# Patient Record
Sex: Female | Born: 1940 | Race: White | Hispanic: No | Smoking: Former smoker
Health system: Southern US, Community
[De-identification: ages and names within clinical notes are randomized; demographics above are authoritative.]

## PROBLEM LIST (undated history)

## (undated) DIAGNOSIS — I771 Stricture of artery: Secondary | ICD-10-CM

## (undated) DIAGNOSIS — K439 Ventral hernia without obstruction or gangrene: Secondary | ICD-10-CM

## (undated) DIAGNOSIS — I10 Essential (primary) hypertension: Secondary | ICD-10-CM

## (undated) DIAGNOSIS — E079 Disorder of thyroid, unspecified: Secondary | ICD-10-CM

## (undated) DIAGNOSIS — N189 Chronic kidney disease, unspecified: Secondary | ICD-10-CM

## (undated) DIAGNOSIS — E785 Hyperlipidemia, unspecified: Secondary | ICD-10-CM

## (undated) HISTORY — DX: Stricture of artery: I77.1

## (undated) HISTORY — PX: CHOLECYSTECTOMY: SHX55

## (undated) HISTORY — DX: Disorder of thyroid, unspecified: E07.9

## (undated) HISTORY — DX: Essential (primary) hypertension: I10

## (undated) HISTORY — DX: Ventral hernia without obstruction or gangrene: K43.9

## (undated) HISTORY — DX: Hyperlipidemia, unspecified: E78.5

## (undated) HISTORY — DX: Chronic kidney disease, unspecified: N18.9

---

## 2008-12-03 HISTORY — PX: OTHER SURGICAL HISTORY: SHX169

## 2008-12-13 ENCOUNTER — Encounter: Admission: RE | Admit: 2008-12-13 | Discharge: 2008-12-13 | Payer: Self-pay | Admitting: Family Medicine

## 2008-12-31 ENCOUNTER — Encounter: Admission: RE | Admit: 2008-12-31 | Discharge: 2008-12-31 | Payer: Self-pay | Admitting: Family Medicine

## 2009-01-06 ENCOUNTER — Ambulatory Visit: Payer: Self-pay | Admitting: *Deleted

## 2009-01-13 ENCOUNTER — Encounter: Admission: RE | Admit: 2009-01-13 | Discharge: 2009-01-13 | Payer: Self-pay | Admitting: *Deleted

## 2009-01-13 ENCOUNTER — Ambulatory Visit: Payer: Self-pay | Admitting: *Deleted

## 2009-01-26 ENCOUNTER — Ambulatory Visit: Payer: Self-pay | Admitting: *Deleted

## 2009-01-26 ENCOUNTER — Ambulatory Visit (HOSPITAL_COMMUNITY): Admission: RE | Admit: 2009-01-26 | Discharge: 2009-01-26 | Payer: Self-pay | Admitting: *Deleted

## 2009-03-14 ENCOUNTER — Encounter (INDEPENDENT_AMBULATORY_CARE_PROVIDER_SITE_OTHER): Payer: Self-pay | Admitting: *Deleted

## 2009-03-14 ENCOUNTER — Ambulatory Visit: Payer: Self-pay | Admitting: *Deleted

## 2009-03-14 ENCOUNTER — Inpatient Hospital Stay (HOSPITAL_COMMUNITY): Admission: AD | Admit: 2009-03-14 | Discharge: 2009-03-19 | Payer: Self-pay | Admitting: *Deleted

## 2009-03-28 ENCOUNTER — Ambulatory Visit: Payer: Self-pay | Admitting: *Deleted

## 2009-04-07 ENCOUNTER — Ambulatory Visit: Payer: Self-pay | Admitting: *Deleted

## 2009-05-05 ENCOUNTER — Ambulatory Visit: Payer: Self-pay | Admitting: *Deleted

## 2011-03-14 LAB — BASIC METABOLIC PANEL
BUN: 10 mg/dL (ref 6–23)
BUN: 11 mg/dL (ref 6–23)
CO2: 21 mEq/L (ref 19–32)
CO2: 22 mEq/L (ref 19–32)
CO2: 25 mEq/L (ref 19–32)
Calcium: 6.9 mg/dL — ABNORMAL LOW (ref 8.4–10.5)
Calcium: 7.3 mg/dL — ABNORMAL LOW (ref 8.4–10.5)
Chloride: 107 mEq/L (ref 96–112)
Chloride: 111 mEq/L (ref 96–112)
Chloride: 112 mEq/L (ref 96–112)
Creatinine, Ser: 0.62 mg/dL (ref 0.4–1.2)
Creatinine, Ser: 0.68 mg/dL (ref 0.4–1.2)
GFR calc Af Amer: >60 mL/min (ref >60)
GFR calc Af Amer: >60 mL/min (ref >60)
GFR calc Af Amer: >60 mL/min (ref >60)
GFR calc non Af Amer: >60 mL/min (ref >60)
GFR calc non Af Amer: >60 mL/min (ref >60)
Glucose, Bld: 100 mg/dL — ABNORMAL HIGH (ref 70–99)
Glucose, Bld: 126 mg/dL — ABNORMAL HIGH (ref 70–99)
Potassium: 3.6 mEq/L (ref 3.5–5.1)
Potassium: 3.8 mEq/L (ref 3.5–5.1)
Potassium: 4 mEq/L (ref 3.5–5.1)
Sodium: 136 mEq/L (ref 135–145)
Sodium: 137 mEq/L (ref 135–145)
Sodium: 139 mEq/L (ref 135–145)

## 2011-03-14 LAB — HEMOGLOBIN AND HEMATOCRIT, BLOOD: HCT: 22.9 % — ABNORMAL LOW (ref 36.0–46.0)

## 2011-03-14 LAB — GLUCOSE, CAPILLARY
Glucose-Capillary: 101 mg/dL — ABNORMAL HIGH (ref 70–99)
Glucose-Capillary: 103 mg/dL — ABNORMAL HIGH (ref 70–99)
Glucose-Capillary: 105 mg/dL — ABNORMAL HIGH (ref 70–99)
Glucose-Capillary: 113 mg/dL — ABNORMAL HIGH (ref 70–99)
Glucose-Capillary: 84 mg/dL (ref 70–99)
Glucose-Capillary: 84 mg/dL (ref 70–99)
Glucose-Capillary: 84 mg/dL (ref 70–99)
Glucose-Capillary: 89 mg/dL (ref 70–99)

## 2011-03-14 LAB — POCT I-STAT 7, (LYTES, BLD GAS, ICA,H+H)
Bicarbonate: 22.9 mEq/L (ref 20.0–24.0)
HCT: 18 % — ABNORMAL LOW (ref 36.0–46.0)
Hemoglobin: 6.1 g/dL — CL (ref 12.0–15.0)
Patient temperature: 35.7
TCO2: 24 mmol/L (ref 0–100)
pCO2 arterial: 31.5 mmHg — ABNORMAL LOW (ref 35.0–45.0)
pH, Arterial: 7.464 — ABNORMAL HIGH (ref 7.350–7.400)
pO2, Arterial: 368 mmHg — ABNORMAL HIGH (ref 80.0–100.0)

## 2011-03-14 LAB — POCT I-STAT 4, (NA,K, GLUC, HGB,HCT)
Glucose, Bld: 116 mg/dL — ABNORMAL HIGH (ref 70–99)
Hemoglobin: 11.9 g/dL — ABNORMAL LOW (ref 12.0–15.0)
Hemoglobin: 8.5 g/dL — ABNORMAL LOW (ref 12.0–15.0)
Potassium: 3.8 mEq/L (ref 3.5–5.1)
Sodium: 138 mEq/L (ref 135–145)
Sodium: 141 mEq/L (ref 135–145)

## 2011-03-14 LAB — DIFFERENTIAL
Basophils Relative: 0 % (ref 0–1)
Eosinophils Absolute: 0 10*3/uL (ref 0.0–0.7)
Lymphs Abs: 0.6 10*3/uL — ABNORMAL LOW (ref 0.7–4.0)
Monocytes Absolute: 0.2 10*3/uL (ref 0.1–1.0)
Monocytes Relative: 4 % (ref 3–12)
Neutro Abs: 5.4 10*3/uL (ref 1.7–7.7)

## 2011-03-14 LAB — TYPE AND SCREEN

## 2011-03-14 LAB — COMPREHENSIVE METABOLIC PANEL
ALT: 12 U/L (ref 0–35)
AST: 24 U/L (ref 0–37)
Albumin: 1.9 g/dL — ABNORMAL LOW (ref 3.5–5.2)
Albumin: 3.6 g/dL (ref 3.5–5.2)
Alkaline Phosphatase: 29 U/L — ABNORMAL LOW (ref 39–117)
Alkaline Phosphatase: 76 U/L (ref 39–117)
BUN: 13 mg/dL (ref 6–23)
Chloride: 109 mEq/L (ref 96–112)
Chloride: 111 mEq/L (ref 96–112)
GFR calc Af Amer: >60 mL/min (ref >60)
Glucose, Bld: 95 mg/dL (ref 70–99)
Potassium: 3.6 mEq/L (ref 3.5–5.1)
Potassium: 4.2 mEq/L (ref 3.5–5.1)
Total Bilirubin: 0.5 mg/dL (ref 0.3–1.2)
Total Bilirubin: 0.5 mg/dL (ref 0.3–1.2)

## 2011-03-14 LAB — CBC
HCT: 27.2 % — ABNORMAL LOW (ref 36.0–46.0)
HCT: 20 % — ABNORMAL LOW (ref 36.0–46.0)
HCT: 27 % — ABNORMAL LOW (ref 36.0–46.0)
HCT: 27.2 % — ABNORMAL LOW (ref 36.0–46.0)
HCT: 33 % — ABNORMAL LOW (ref 36.0–46.0)
HCT: 36.9 % (ref 36.0–46.0)
Hemoglobin: 11.4 g/dL — ABNORMAL LOW (ref 12.0–15.0)
Hemoglobin: 12.8 g/dL (ref 12.0–15.0)
Hemoglobin: 6.3 g/dL — CL (ref 12.0–15.0)
Hemoglobin: 7 g/dL — CL (ref 12.0–15.0)
Hemoglobin: 7.7 g/dL — CL (ref 12.0–15.0)
Hemoglobin: 9.2 g/dL — ABNORMAL LOW (ref 12.0–15.0)
Hemoglobin: 9.4 g/dL — ABNORMAL LOW (ref 12.0–15.0)
MCHC: 34.4 g/dL (ref 30.0–36.0)
MCHC: 35 g/dL (ref 30.0–36.0)
MCHC: 35 g/dL (ref 30.0–36.0)
MCHC: 35.1 g/dL (ref 30.0–36.0)
MCV: 88.6 fL (ref 78.0–100.0)
MCV: 89.1 fL (ref 78.0–100.0)
MCV: 89.5 fL (ref 78.0–100.0)
MCV: 89.6 fL (ref 78.0–100.0)
MCV: 90.4 fL (ref 78.0–100.0)
MCV: 90.8 fL (ref 78.0–100.0)
Platelets: 137 10*3/uL — ABNORMAL LOW (ref 150–400)
Platelets: 105 10*3/uL — ABNORMAL LOW (ref 150–400)
Platelets: 130 10*3/uL — ABNORMAL LOW (ref 150–400)
Platelets: 132 10*3/uL — ABNORMAL LOW (ref 150–400)
Platelets: 170 10*3/uL (ref 150–400)
Platelets: 208 10*3/uL (ref 150–400)
RBC: 2 MIL/uL — ABNORMAL LOW (ref 3.87–5.11)
RBC: 2.23 MIL/uL — ABNORMAL LOW (ref 3.87–5.11)
RBC: 2.47 MIL/uL — ABNORMAL LOW (ref 3.87–5.11)
RBC: 3 MIL/uL — ABNORMAL LOW (ref 3.87–5.11)
RBC: 3.63 MIL/uL — ABNORMAL LOW (ref 3.87–5.11)
RDW: 14.5 % (ref 11.5–15.5)
RDW: 14.1 % (ref 11.5–15.5)
RDW: 14.3 % (ref 11.5–15.5)
WBC: 10.5 10*3/uL (ref 4.0–10.5)
WBC: 20.2 10*3/uL — ABNORMAL HIGH (ref 4.0–10.5)
WBC: 6.3 10*3/uL (ref 4.0–10.5)
WBC: 8.3 10*3/uL (ref 4.0–10.5)
WBC: 8.8 10*3/uL (ref 4.0–10.5)

## 2011-03-14 LAB — BLOOD GAS, ARTERIAL
Acid-base deficit: 3.7 mmol/L — ABNORMAL HIGH (ref 0.0–2.0)
Bicarbonate: 22.2 mEq/L (ref 20.0–24.0)
Bicarbonate: 23.6 mEq/L (ref 20.0–24.0)
TCO2: 23.3 mmol/L (ref 0–100)
TCO2: 25.6 mmol/L (ref 0–100)
pCO2 arterial: 35 mmHg (ref 35.0–45.0)
pCO2 arterial: 66 mmHg (ref 35.0–45.0)
pH, Arterial: 7.179 — CL (ref 7.350–7.400)
pH, Arterial: 7.419 — ABNORMAL HIGH (ref 7.350–7.400)
pO2, Arterial: 86.1 mmHg (ref 80.0–100.0)

## 2011-03-14 LAB — POCT I-STAT 3, ART BLOOD GAS (G3+)
Acid-base deficit: 5 mmol/L — ABNORMAL HIGH (ref 0.0–2.0)
Bicarbonate: 18.4 mEq/L — ABNORMAL LOW (ref 20.0–24.0)
Patient temperature: 35.3
TCO2: 19 mmol/L (ref 0–100)
pH, Arterial: 7.443 — ABNORMAL HIGH (ref 7.350–7.400)
pO2, Arterial: 151 mmHg — ABNORMAL HIGH (ref 80.0–100.0)

## 2011-03-14 LAB — CROSSMATCH
ABO/RH(D): A POS
Antibody Screen: NEGATIVE

## 2011-03-14 LAB — PROTIME-INR
INR: 1 (ref 0.00–1.49)
INR: 1.3 (ref 0.00–1.49)
Prothrombin Time: 16.9 seconds — ABNORMAL HIGH (ref 11.6–15.2)

## 2011-03-14 LAB — URINALYSIS, ROUTINE W REFLEX MICROSCOPIC
Bilirubin Urine: NEGATIVE
Ketones, ur: NEGATIVE mg/dL
Nitrite: NEGATIVE
Urobilinogen, UA: 0.2 mg/dL (ref 0.0–1.0)

## 2011-03-14 LAB — APTT: aPTT: 40 seconds — ABNORMAL HIGH (ref 24–37)

## 2011-03-14 LAB — AMYLASE: Amylase: 131 U/L (ref 27–131)

## 2011-03-14 LAB — MAGNESIUM: Magnesium: 1.1 mg/dL — ABNORMAL LOW (ref 1.5–2.5)

## 2011-03-14 LAB — HEMOGLOBIN A1C
Hgb A1c MFr Bld: 5.4 % (ref 4.6–6.1)
Mean Plasma Glucose: 108 mg/dL

## 2011-03-20 LAB — POCT I-STAT, CHEM 8
Calcium, Ion: 0.73 mmol/L — ABNORMAL LOW (ref 1.12–1.32)
Chloride: 118 mEq/L — ABNORMAL HIGH (ref 96–112)
HCT: 39 % (ref 36.0–46.0)
Potassium: 4.2 mEq/L (ref 3.5–5.1)

## 2011-04-17 NOTE — Op Note (Signed)
Janice [name], Janice [name]                 ACCOUNT NO.:  [id number]   MEDICAL RECORD NO.:  [id number]          PATIENT TYPE:  AMB   LOCATION:  SDS                          FACILITY:  [facility]   PHYSICIAN:  Balinda Quails, M.D.    DATE OF BIRTH:  1941/04/27   DATE OF PROCEDURE:  01/26/2009  DATE OF DISCHARGE:                               OPERATIVE REPORT   PHYSICIAN:  Balinda Quails, MD   DIAGNOSIS:  Aortoiliac occlusive disease.   PROCEDURE:  Retrograde right femoral arteriogram.   ACCESS:  Right common femoral artery 5-French sheath.   CONTRAST:  A 30 mL of Visipaque.   COMPLICATIONS:  None apparent.   CLINICAL NOTE:  Janice [name] is a 70 year old female with bilateral leg  pain.  Workup revealed evidence of aortoiliac occlusive disease and  multilevel peripheral vascular disease in her legs.  Brought to the Cath  Lab at this time for planned diagnostic arteriography and possible  intervention.   PROCEDURE NOTE:  The patient was brought to the Cath Lab in stable  condition.  Placed in supine position.  Both groins prepped and draped  in a sterile fashion.   Skin and subcutaneous tissue in right groin instilled with 1% Xylocaine.  An 18-gauge needle introduced into the right common femoral artery.  A  0.035 Magic Torque guidewire advanced through the needle into the right  iliac system.  This could be passed under fluoroscopy to the aortic  bifurcation but not into the abdominal aorta.  The needle removed.  A 5-  French sheath advanced over the guidewire.  The dilator removed, sheath  flushed with heparin saline solution.  Attempt was then made to pass a  Glidewire across the aortic bifurcation successfully.  An end-hole  catheter was then passed up to the aortic bifurcation and hand injection  of contrast made.  This revealed complex plaque at the aortic  bifurcation.  Verified findings on CT scan of the left common iliac  artery occlusion.  Severe disease of the right common iliac  artery.   The procedure was aborted at this time.  The patient will require  aortobifemoral bypass.   Right femoral sheath removed.  No apparent complications.  The patient  will undergo a Cardiolite stress test and plan to go ahead with  aortobifemoral bypass.      Balinda Quails, M.D.  Electronically Signed     PGH/MEDQ  D:  01/26/2009  T:  01/27/2009  Job:  [id number]   cc:   Renaye Rakers, M.D.

## 2011-04-17 NOTE — Procedures (Signed)
LOWER EXTREMITY ARTERIAL EVALUATION-SINGLE LEVEL   INDICATION:  Follow-up evaluation post aortobifemoral bypass graft.   HISTORY:  Diabetes:  No.  Cardiac:  No.  Hypertension:  Yes.  Smoking:  Half pack per day.  Previous Surgery:  Aortobifemoral bypass graft on 03/14/09   RESTING SYSTOLIC PRESSURES: (ABI)                          RIGHT                LEFT  Brachial:               158                  120  Anterior tibial:        84                   88  Posterior tibial:       90 (0.57)            104 (0.66)  Peroneal:  DOPPLER WAVEFORM ANALYSIS:  Anterior tibial:        Monophasic           Monophasic  Posterior tibial:       Monophasic           Monophasic  Peroneal:   PREVIOUS ABI'S:  Date:  RIGHT:  LEFT:   IMPRESSION:  1. Ankle brachial indices suggest moderate arterial occlusive disease      bilaterally.  2. Brachial gradients suggest left subclavian stenosis.   ___________________________________________  P. Liliane Bade, M.D.   MC/MEDQ  D:  04/07/2009  T:  04/07/2009  Job:  [id number]

## 2011-04-17 NOTE — Discharge Summary (Signed)
NAMEMEGEAN, FABIO                 ACCOUNT NO.:  [id number]   MEDICAL RECORD NO.:  [id number]          PATIENT TYPE:  INP   LOCATION:  2020                         FACILITY:  [facility]   PHYSICIAN:  Balinda Quails, M.D.    DATE OF BIRTH:  1941/02/02   DATE OF ADMISSION:  03/14/2009  DATE OF DISCHARGE:  03/19/2009                               DISCHARGE SUMMARY   ADMISSION DIAGNOSIS:  Aortoiliac occlusive disease.   FINAL DISCHARGE DIAGNOSES:  1. Aortoiliac occlusive disease, status post aortobifemoral bypass      graft.  2. Hypertension.  3. Postoperative acute blood loss anemia, requiring transfusion.  4. Ongoing tobacco abuse.  Currently, on nicotine patch.  5. Mild postoperative hyperglycemia without history of diabetes and      hemoglobin A1c of 5.4.   PROCEDURES:  End-to-end aortobifemoral bypass using 40 x 7 Hemashield  graft with bilateral profundoplasty and infrarenal aortic endarterectomy  by Dr. Balinda Quails.   BRIEF HISTORY:  Ms. Fischbach is a 70 year old Caucasian female with history  of worsening bilateral leg pain and swelling.  She was noted to have  night pain in her feet and cessation with some numbness and tingling in  her feet as well.  This has been progressive over the last year.  No  history of nonhealing foot ulcers.  Dr. Madilyn Fireman recommended aortobifemoral  bypass grafting for her aortoiliac occlusive disease and rest pain.   HOSPITAL COURSE:  Ms. Ding was electively admitted to [facility] on March 14, 2009.  She underwent a previously mentioned  procedure.  Postoperatively, the patient was initially extubated, but  shortly thereafter it did require reintubation.  She was transferred to  the surgical ICU in stable condition, however.  She was ultimately  extubated successfully on postoperative day #1.  She remained in ICU  through March 16, 2009, at that point was felt appropriate to transfer  to Telemetry Unit 2000 where she remained until discharge.   She was  treated for postoperative acute blood loss anemia with hemoglobin down  to 7.  She was transfused with 2 units of packed red blood cells with  hemoglobin up to 9.4 and hematocrit 29.2.  Initially, we did monitor her  blood glucose levels, indicated A1c of 5.4 and subsequently sugars have  normalized and Accu-Cheks were discontinued.  Her OG tube was removed  after extubation.  She was advanced to sips of clear liquids on  postoperative day #2 and over the course of next few days was advanced  to a regular diet, which she tolerated without difficulty.  Her Foley  cath was also discontinued and she had no problems of voiding.  Her  abdominal incision was clean, dry, intact.  Her groin incision did have  a scant amount of serosanguineous drainage, which seem to subside once  we began local wound care with gauze dressing.  She also had some mild  right upper thigh erythema, which also improved prior to discharge and  therefore no antibiotic therapy was felt indicated.  Postoperatively,  once Telemetry Unit 2000, she continued to  remain in hemodynamically  stable condition.  Vitals:  Her systolic blood pressure was elevated  130s-160s and once she was taking p.o.'s, we did start her home  antihypertensive medication.  She remained in sinus rhythm with a rate  up to 90s.  Overall, afebrile and saturating 97% on room air.  She was  able ambulate and she did undergo tobacco cessation consult and plans to  continue the nicotine patch, which was started this admission.  Her  additional postoperative labs showed a white count 7.9 and platelet  count of 137.  Sodium 139, potassium 3.8, BUN of 10, and creatinine  0.62.  Amylase of 131, and preoperative liver function tests were within  normal limits.   DISPOSITION:  Ms. Sutherland was felt appropriate for discharge home on March 19, 2009, in stable and improving condition and tolerating regular food.   DISCHARGE MEDICATIONS:  1. Crestor 10 mg  p.o. daily.  2. Aspirin 81 mg p.o. q. 8 a.m.  3. Twynsta 40/5 mg 1 p.o. q.a.m.  4. Nicotine patch 21 mg daily x6 weeks, then decrease to 14 mg.  5. Percocet 5/325 mg 1-2 tablets p.o. q.4 h. p.r.n. pain.  6. She has also been on Plavix 75 mg p.o. q.a.m., but was instructed      by Dr. Madilyn Fireman that she could stop this following her surgery.   DISCHARGE INSTRUCTIONS:  She should continue heart-healthy diet.  May  shower.  Clean her incisions gently with soap and water.  She was  instructed to place dry gauze dressing to her groin incisions daily.  Should avoid driving or heavy lifting for the next 2 weeks.  She will  see Dr. Madilyn Fireman in approximately 3 weeks with ABIs and should call sooner  if she has fever greater than 101, redness or drainage from her incision  site, persistent nausea, vomiting, or abdominal pain.  She will have  staple removal appointment in 7-10 days and our office will contact her  regarding specific appointment date and time.        Jerold Coombe, P.A.      Balinda Quails, M.D.  Electronically Signed    AWZ/MEDQ  D:  03/19/2009  T:  03/20/2009  Job:  [id number]   cc:   Renaye Rakers, M.D.

## 2011-04-17 NOTE — Op Note (Signed)
NAMEANGY, SWEARENGIN                 ACCOUNT NO.:  [id number]   MEDICAL RECORD NO.:  [id number]          PATIENT TYPE:  INP   LOCATION:  2310                         FACILITY:  [facility]   PHYSICIAN:  Balinda Quails, M.D.    DATE OF BIRTH:  07/19/1941   DATE OF PROCEDURE:  03/14/2009  DATE OF DISCHARGE:                               OPERATIVE REPORT   SURGEON:  Balinda Quails, MD   ASSISTANT:  1. Janetta Hora. Fields, MD  2. Jerold Coombe, PA   ANESTHETIC:  General endotracheal.   ANESTHESIOLOGIST:  Quita Skye. Krista Blue, MD   PREOPERATIVE DIAGNOSES:  1. Aortoiliac occlusive disease.  2. Ischemic rest pain, lower extremities.   POSTOPERATIVE DIAGNOSES:  1. Aortoiliac occlusive disease.  2. Ischemic rest pain, lower extremities.   PROCEDURE:  1. End-to-end aortobifemoral bypass with 14 x 7 Hemashield graft.  2. Bilateral profundoplasty.  3. Infrarenal aortic endarterectomy.   CLINICAL NOTE:  Janice [name] is a 70 year old female with history of  tobacco abuse, hypertension, and hyperlipidemia.  She developed ischemic  rest pain in the lower extremities with marked reduction of ankle  brachial indices less than 0.2 bilaterally.   She has been worked up revealing extensive aortoiliac occlusive disease.  She also has infrainguinal occlusive disease.  Brought to the operating  room at this time for aortobifemoral bypass for relief of ischemic rest  pain.   OPERATIVE PROCEDURE:  The patient was brought to the operating room in  stable condition.  Placed under general endotracheal anesthesia.  Foley  catheter, arterial line, and Swan-Ganz catheter in place.  In the supine  position, the abdomen and both legs were prepped and draped in a sterile  fashion.   Bilateral longitudinal groin skin incisions were made at the inguinal  ligament.  Dissection was carried through the subcutaneous tissue with  electrocautery.  Lymphatics were divided.  Deep dissection was carried  down to expose the  common femoral artery which was encircled with vessel  loop.  The common femoral arteries were severely diseased bilaterally  with calcified plaque.  Distal dissection was carried down to the origin  of the profunda and superficial femoral arteries which were freed and  encircled with vessel loops.  The superficial femoral artery bilaterally  was also severely diseased.  The profunda femoris arteries were soft and  the proximal segments dissected out to the first major branches and  encircled with vessel loops.   A longitudinal skin incision was then made from the xiphoid to  umbilicus.  Dissection was carried through the subcutaneous tissue with  electrocautery.  Linea alba was incised.  The patient was status post  cholecystectomy.  There were adhesions present in the right upper  quadrant which were divided adequately to expose the aorta.  Small bowel  was retracted to the right common and transverse colon brought  superiorly.  Evaluation of the infrarenal aorta revealed extensive cast-  like calcification of the entire circumference of the infrarenal aorta.  This extended down to involve the proximal common iliac arteries  bilaterally.  Heavily calcified plaque was  present.  Distal dissection  was carried up to the left renal vein which was mobilized and retracted.  This allowed exposure of the renal arteries bilaterally which were  encircled with vessel loops.  The suprarenal aorta revealed moderate  plaque, but was not severely diseased and was cleared adequately for  placement of the proximal clamp.   The inferior mesenteric artery was a large vessel.  This was preserved  and then encircled with vessel loop.  The origin of the common iliac  arteries were exposed bilaterally.  Retroperitoneal tunnels were made  along the common iliac arteries and external iliac arteries behind the  ureters to the groins bilaterally.   The patient was administered with 7000 units of heparin  intravenously  and 25 g of mannitol intravenously.  The suprarenal aorta was controlled  with an aortic DeBakey clamp and the renal arteries were controlled  bilaterally with vessel loops.  The infrarenal aorta at the level of  bifurcation was controlled with an angled Fogarty clamp.  The aorta  opened transversely along the infrarenal segment.  This revealed a  heavily calcified cast-like plaque of the infrarenal aorta.  The distal  divided aorta was endarterectomized, beveled, and oversewn with running  4-0 Prolene suture with a double layer.  The proximal aorta was then  endarterectomized adequately to allow anastomosis of the end-to-end  graft.  A 14 x 7 Hemashield graft was anastomosed end-to-end to the  infrarenal aorta using running 4-0 Prolene suture.  At the completion of  the proximal anastomosis, the graft was flushed and each limb of graft  was controlled with Fogarty clamp.  Each limb of the graft was then  tunneled to the respective groin.   Bilateral femoral anastomoses were carried out similarly.  The common  femoral, profunda femoris, and superficial femoral arteries were  controlled bilaterally with clamps.  A longitudinal arteriotomy was made  in the proximal profunda femoris artery which was extended back into the  common femoral artery.  The each limb of the graft was beveled and  anastomosed end-to-side to the common femoral artery out onto the  profunda femoris artery origin using running 6-0 Prolene suture in an  end-to-side fashion.  At the completion of each femoral anastomosis, the  femoral vessels were flushed and clamps were removed directing flow down  to the profunda.  Excellent flow was patent in both legs.  Adequate  hemostasis was obtained.  The patient was administered with 50 mg of  protamine intravenously.  Sponges and instrument counts were correct.   The groin incisions were closed with running 2-0 Vicryl suture in 2  subcutaneous layers and  staples applied to the skin.   The abdomen was examined to assure there were no retained instruments or  sponges.  The retroperitoneum was closed over the graft using running 3-  0 Prolene suture.  The fascia was closed with running #1 PDS suture.  Subcutaneous tissue was closed with interrupted 3-0 Vicryl suture.  Staples were applied to the skin.   The patient tolerated the procedure well.  She was transferred to the  recovery room in stable condition.      Balinda Quails, M.D.  Electronically Signed     PGH/MEDQ  D:  03/14/2009  T:  03/15/2009  Job:  [id number]   cc:   Renaye Rakers, M.D.

## 2011-04-17 NOTE — H&P (Signed)
[name], [name]                 ACCOUNT NO.:  [id number]   MEDICAL RECORD NO.:  [id number]          PATIENT TYPE:  INP   LOCATION:  2310                         FACILITY:  [facility]   PHYSICIAN:  Balinda Quails, M.D.    DATE OF BIRTH:  Jan 29, 1941   DATE OF ADMISSION:  03/14/2009  DATE OF DISCHARGE:                              HISTORY & PHYSICAL   PRIMARY CARE PHYSICIAN:  Dr. Fabiola Backer.   ADMISSION DIAGNOSIS:  Aortoiliac occlusive disease.   HISTORY:  Janice [name] is a 70 year old female with a history of  worsening bilateral leg pain and swelling.  She has noted night pain in  her feet.  Sensation of some numbness and tingling in her feet.  This  has been progressing over approximately a year.  She notes worsening  pain at night.  She does have a burning discomfort in her feet also at  night.   No history of recent trauma.  Denies nonhealing lesions.  No history of  ulceration in lower extremities.   Risk factors for peripheral vascular disease include hypertension,  hyperlipidemia, and tobacco abuse.   PAST MEDICAL HISTORY:  1. Hypertension.  2. Hyperlipidemia.  3. Tobacco abuse.   MEDICATIONS:  1. Crestor 10 mg daily.  2. Twynsta 40/5 daily.  3. Plavix 75 mg daily.  4. Aspirin 81 mg daily.   ALLERGIES:  None known.   SOCIAL HISTORY:  The patient is widowed with 4 children.  She is a  retired Lawyer.  She smokes one-half to one pack of cigarettes daily.  No  regular alcohol use.   FAMILY HISTORY:  Mother deceased, age 57 of congestive heart failure.  Father deceased, age 16 of congestive heart failure.  One sister living,  age 35 with a history of diabetes, hypertension, and hyperlipidemia.  One sister living, age 40, also with history of hypertension and  hyperlipidemia.   REVIEW OF SYSTEMS:  The patient notes only chronic leg pain and no other  complaints.   PHYSICAL EXAMINATION:  GENERAL:  Obese 70 year old female, appears to be  in stated age.  No acute  distress.  Alert and oriented.  VITAL SIGNS:  BP 136/88, pulses is 86 per minute, respirations are 18  per minute, and temperature is 98.2.  HEENT:  Unremarkable.  NECK:  Supple.  No thyromegaly or adenopathy.  CHEST:  Distant breath sounds.  Air entry bilaterally.  No rales or  rhonchi.  CARDIOVASCULAR:  No carotid bruits.  Normal heart sounds without  murmurs.  No gallops or rubs.  ABDOMEN:  Soft and nontender.  No masses or organomegaly.  Right upper  quadrant scar.  Normal bowel sounds without bruits.  EXTREMITIES:  Lower Extremities, 1+ femoral on the right, absent femoral  on the left.  No popliteal, posterior tibial, or dorsalis pedis pulses.  Bilateral dependent rubor.  No skin ulceration.  NEUROLOGIC:  Cranial nerves intact.  Strength equal bilaterally.  1+  reflexes.  SKIN:  Intact without rash or ulceration.   INVESTIGATIONS:  Lower extremity arterial Dopplers reveal any ankle-  brachial indices of 0.18 and  0.16 on the right and left legs  respectively.  Severe multisystem lower extremity peripheral vascular  disease present with monophasic anterior waveforms throughout both lower  extremities.   CT angiogram was performed, this verifies extensive atherosclerotic  vascular disease with aortoiliac segment involving the aortoiliac  segment and diffuse infrainguinal disease.   IMPRESSION:  1. Advanced-multilevel peripheral vascular disease involving the      aortoiliac segment and infrainguinal segments.  2. Hypertension.  3. Hyperlipidemia.  4. Tobacco abuse.   ADMISSION PLAN:  The patient admitted electively to [facility]  on March 14, 2009, for aortobifemoral bypass for limb salvage.   Potential risks of the operative procedure reviewed with the patient and  family with a major morbidity mortality approximately 5%.  This  includes, but is not limited to MI, CVA, renal failure, limb loss,  infection, transfusion risks, death or other major  complication.      Balinda Quails, M.D.  Electronically Signed     PGH/MEDQ  D:  03/14/2009  T:  03/14/2009  Job:  [id number]   cc:   Dr. Fabiola Backer

## 2011-04-17 NOTE — Assessment & Plan Note (Signed)
OFFICE VISIT   Janice [name], Janice [name]  DOB:  Jan 26, 1941                                       01/13/2009  [id number]#:[id number]   The patient underwent a CT angiogram today and a return visit to the  office.  This reveals extensive atherosclerotic disease of the  aortoiliac segment and diffuse infrainguinal atherosclerotic vascular  disease.   There appears to be significant stenosis of the left common iliac artery  and diffuse disease of the right common iliac artery.   It is my hope that we may be able to undertake some percutaneous  intervention to aid with this advanced peripheral vascular disease.  I  have scheduled her to undergo a lower extremity arteriogram with  possible intervention at [facility].   Balinda Quails, M.D.  Electronically Signed   PGH/MEDQ  D:  01/13/2009  T:  01/14/2009  Job:  [id number]   cc:   Renaye Rakers, M.D.

## 2011-04-17 NOTE — Assessment & Plan Note (Signed)
OFFICE VISIT   Janice [name], Janice [name]  DOB:  [birth year]                                       05/05/2009  WRUEA#:[id number]   The patient is status post aortobifemoral bypass with bilateral  profundoplasty and infrarenal aortic endarterectomy in April of this  year.  She has continued to do well postoperatively.  Ankle brachial  indices have improved to 0.57 on the right and 0.66 on the left.  Preoperative values were less than 0.2.   Incisions have healed well.  No evidence of hernia.  Groin incisions  intact without swelling or lymphocele.  Femoral pulses 2+ bilaterally.   Overall doing well.  Discharged from further scheduled follow-up.  She  is planning to move to [place].   Balinda Quails, M.D.  Electronically Signed   PGH/MEDQ  D:  05/05/2009  T:  05/06/2009  Job:  [id number]   cc:   Renaye Rakers, M.D.  Lenn Sink, D.P.M.

## 2011-04-17 NOTE — Assessment & Plan Note (Signed)
OFFICE VISIT   Janice [name], Janice [name]  DOB:  10-29-1941                                       04/07/2009  [id number]#:[id number]   The patient underwent aortobifemoral bypass, bilateral profundoplasty  and infrarenal aortic endarterectomy carried out March 14, 2009, at  [facility].  This was carried out for severely ischemic lower  extremities with preoperative ABIs less than 0.2 bilaterally.   She has done well since her surgery.  Notes marked improvement in her  leg symptoms.  The ABIs have improved to 0.57 and 0.66 in the right and  left legs respectively.   Incisions are healing well.  Mild maturation noted in the groins  bilaterally.  Otherwise no problems.   The patient instructed regarding local wound care in the groins.  Plan  to follow-up in approximately 4-6 weeks.   Referred to Dr. Charlsie Merles for podiatry care.   Balinda Quails, M.D.  Electronically Signed   PGH/MEDQ  D:  04/07/2009  T:  04/08/2009  Job:  [id number]   cc:   Renaye Rakers, M.D.  Lenn Sink, D.P.M.

## 2011-04-17 NOTE — Consult Note (Signed)
VASCULAR SURGERY CONSULTATION   [name], [name]  DOB:  03-22-1941                                       01/06/2009  ZOXWR#:[id number]   REFERRING PHYSICIAN:  Renaye Rakers, M.D.   REFERRAL DIAGNOSIS:  Bilateral leg pain.   HISTORY:  The patient is a 70 year old female with a history of  worsening bilateral leg pain and swelling.  She has noted night pain in  her feet.  Sensation of some numbness and tingling in her feet.  This  has been progressing over approximately a year.  She notes worsening  pain at night.  She does have a burning discomfort in her feet also at  night.   No history of recent trauma.  Denies nonhealing lesions.  No history of  ulceration of the lower extremities.   Risk factors for peripheral vascular disease include hypertension,  hyperlipidemia and tobacco abuse.   PAST MEDICAL HISTORY:  1. Hypertension.  2. Hyperlipidemia.  3. Tobacco abuse.   MEDICATIONS:  1. Crestor 10 mg daily.  2. Twynsta 40/5 daily.  3. Plavix 75 mg daily.  4. Aspirin 81 mg daily.   ALLERGIES:  None known.   SOCIAL HISTORY:  The patient is widowed with four children.  She is a  retired Lawyer.  She smokes 1/2 to 1 pack of cigarettes daily.  No regular  alcohol use.   FAMILY HISTORY:  Mother deceased age 26 of congestive heart failure.  Father deceased age 39 of congestive heart failure.  One sister living  age 62 with a history of diabetes, hypertension and hyperlipidemia.  One  sister living age 3 also with a history of hypertension and  hyperlipidemia.   REVIEW OF SYSTEMS:  Refer to patient encounter form.  This was reviewed  with the patient today.  She notes chronic pain in her legs and no other  complaints.   PHYSICAL EXAM:  General:  Moderately obese 70 year old female.  Appears  stated age.  No acute distress.  Alert and oriented.  Vital signs:  BP  136/88 left arm, 148/81 right arm, pulse is 86, temperature 98.4.  HEENT:  Unremarkable.  Neck:   Supple.  No thyromegaly or adenopathy.  Chest:  Distant breath sounds.  Air entry equal bilaterally, no rales or  rhonchi.  Cardiovascular:  No carotid bruits.  Normal heart sounds  without murmurs.  No gallops or rubs.  Abdomen:  Obese, soft, nontender.  No masses or organomegaly.  Normal bowel sounds without bruits.  Lower  extremities:  1+ right femoral, absent left femoral pulse.  No  popliteal, posterior tibial or dorsalis pedis pulses.  Bilateral  dependent rubor.  No skin ulceration.  Neurological:  Cranial nerves  intact.  Strength equal bilaterally.  1+ reflexes.  Skin:  Intact  without rash or ulceration.   INVESTIGATIONS:  Lower extremity arterial Doppler evaluation reveals  ABIs of 0.18 and 0.16 in the right and left legs respectively.  Severe  multilevel lower extremity peripheral vascular disease present as  evidenced by monophasic arterial waveforms throughout both lower  extremities.   IMPRESSION:  1. Advanced bilateral peripheral vascular disease with rest pain.  2. Hypertension.  3. Hyperlipidemia.  4. Tobacco abuse.   RECOMMENDATIONS:  The patient was counseled regarding smoking cessation  and its effects on advanced peripheral vascular disease.  The patient  also counseled  regarding the seriousness of her peripheral vascular  disease and potential limb loss associated with this.  Scheduled to  undergo CT angiogram of the abdomen and lower extremities with return  visit next week.   Balinda Quails, M.D.  Electronically Signed  [facility]MED[facility]  D:  01/06/2009  T:  01/06/2009  Job:  [id number]   cc:   Renaye Rakers, M.D.

## 2011-12-13 DIAGNOSIS — E039 Hypothyroidism, unspecified: Secondary | ICD-10-CM | POA: Diagnosis not present

## 2011-12-13 DIAGNOSIS — E785 Hyperlipidemia, unspecified: Secondary | ICD-10-CM | POA: Diagnosis not present

## 2011-12-13 DIAGNOSIS — I10 Essential (primary) hypertension: Secondary | ICD-10-CM | POA: Diagnosis not present

## 2011-12-19 DIAGNOSIS — E039 Hypothyroidism, unspecified: Secondary | ICD-10-CM | POA: Diagnosis not present

## 2011-12-19 DIAGNOSIS — E785 Hyperlipidemia, unspecified: Secondary | ICD-10-CM | POA: Diagnosis not present

## 2011-12-19 DIAGNOSIS — I10 Essential (primary) hypertension: Secondary | ICD-10-CM | POA: Diagnosis not present

## 2012-03-19 DIAGNOSIS — I10 Essential (primary) hypertension: Secondary | ICD-10-CM | POA: Diagnosis not present

## 2012-03-19 DIAGNOSIS — Z79899 Other long term (current) drug therapy: Secondary | ICD-10-CM | POA: Diagnosis not present

## 2012-03-19 DIAGNOSIS — E039 Hypothyroidism, unspecified: Secondary | ICD-10-CM | POA: Diagnosis not present

## 2012-03-19 DIAGNOSIS — E785 Hyperlipidemia, unspecified: Secondary | ICD-10-CM | POA: Diagnosis not present

## 2012-03-19 DIAGNOSIS — E669 Obesity, unspecified: Secondary | ICD-10-CM | POA: Diagnosis not present

## 2012-03-19 DIAGNOSIS — M810 Age-related osteoporosis without current pathological fracture: Secondary | ICD-10-CM | POA: Diagnosis not present

## 2012-05-22 DIAGNOSIS — I10 Essential (primary) hypertension: Secondary | ICD-10-CM | POA: Diagnosis not present

## 2012-05-22 DIAGNOSIS — E669 Obesity, unspecified: Secondary | ICD-10-CM | POA: Diagnosis not present

## 2012-05-22 DIAGNOSIS — E785 Hyperlipidemia, unspecified: Secondary | ICD-10-CM | POA: Diagnosis not present

## 2012-05-22 DIAGNOSIS — I7389 Other specified peripheral vascular diseases: Secondary | ICD-10-CM | POA: Diagnosis not present

## 2013-06-04 DIAGNOSIS — E039 Hypothyroidism, unspecified: Secondary | ICD-10-CM | POA: Diagnosis not present

## 2013-06-04 DIAGNOSIS — R1900 Intra-abdominal and pelvic swelling, mass and lump, unspecified site: Secondary | ICD-10-CM | POA: Diagnosis not present

## 2013-06-04 DIAGNOSIS — I10 Essential (primary) hypertension: Secondary | ICD-10-CM | POA: Diagnosis not present

## 2013-06-04 DIAGNOSIS — E7800 Pure hypercholesterolemia, unspecified: Secondary | ICD-10-CM | POA: Diagnosis not present

## 2013-06-12 DIAGNOSIS — R1900 Intra-abdominal and pelvic swelling, mass and lump, unspecified site: Secondary | ICD-10-CM | POA: Diagnosis not present

## 2013-06-12 DIAGNOSIS — D539 Nutritional anemia, unspecified: Secondary | ICD-10-CM | POA: Diagnosis not present

## 2013-06-12 DIAGNOSIS — E7800 Pure hypercholesterolemia, unspecified: Secondary | ICD-10-CM | POA: Diagnosis not present

## 2013-06-12 DIAGNOSIS — E559 Vitamin D deficiency, unspecified: Secondary | ICD-10-CM | POA: Diagnosis not present

## 2013-06-12 DIAGNOSIS — E039 Hypothyroidism, unspecified: Secondary | ICD-10-CM | POA: Diagnosis not present

## 2013-06-12 DIAGNOSIS — I708 Atherosclerosis of other arteries: Secondary | ICD-10-CM | POA: Diagnosis not present

## 2013-06-12 DIAGNOSIS — I10 Essential (primary) hypertension: Secondary | ICD-10-CM | POA: Diagnosis not present

## 2013-06-22 ENCOUNTER — Ambulatory Visit: Payer: Self-pay | Admitting: Family Medicine

## 2013-06-22 DIAGNOSIS — R933 Abnormal findings on diagnostic imaging of other parts of digestive tract: Secondary | ICD-10-CM | POA: Diagnosis not present

## 2013-06-22 DIAGNOSIS — R9389 Abnormal findings on diagnostic imaging of other specified body structures: Secondary | ICD-10-CM | POA: Diagnosis not present

## 2013-07-01 ENCOUNTER — Ambulatory Visit: Payer: Self-pay | Admitting: Family Medicine

## 2013-07-01 DIAGNOSIS — I10 Essential (primary) hypertension: Secondary | ICD-10-CM | POA: Diagnosis not present

## 2013-07-01 DIAGNOSIS — E7800 Pure hypercholesterolemia, unspecified: Secondary | ICD-10-CM | POA: Diagnosis not present

## 2013-07-01 DIAGNOSIS — I6529 Occlusion and stenosis of unspecified carotid artery: Secondary | ICD-10-CM | POA: Diagnosis not present

## 2013-07-01 DIAGNOSIS — R918 Other nonspecific abnormal finding of lung field: Secondary | ICD-10-CM | POA: Diagnosis not present

## 2013-07-01 DIAGNOSIS — I771 Stricture of artery: Secondary | ICD-10-CM | POA: Diagnosis not present

## 2013-07-06 ENCOUNTER — Ambulatory Visit: Payer: Self-pay | Admitting: Family Medicine

## 2013-07-06 DIAGNOSIS — I6529 Occlusion and stenosis of unspecified carotid artery: Secondary | ICD-10-CM | POA: Diagnosis not present

## 2013-07-06 DIAGNOSIS — I7090 Unspecified atherosclerosis: Secondary | ICD-10-CM | POA: Diagnosis not present

## 2013-07-09 ENCOUNTER — Encounter: Payer: Self-pay | Admitting: Vascular Surgery

## 2013-07-09 ENCOUNTER — Other Ambulatory Visit: Payer: Self-pay

## 2013-07-20 DIAGNOSIS — I10 Essential (primary) hypertension: Secondary | ICD-10-CM | POA: Diagnosis not present

## 2013-07-20 DIAGNOSIS — I70219 Atherosclerosis of native arteries of extremities with intermittent claudication, unspecified extremity: Secondary | ICD-10-CM | POA: Diagnosis not present

## 2013-07-20 DIAGNOSIS — I739 Peripheral vascular disease, unspecified: Secondary | ICD-10-CM | POA: Diagnosis not present

## 2013-07-20 DIAGNOSIS — G458 Other transient cerebral ischemic attacks and related syndromes: Secondary | ICD-10-CM | POA: Diagnosis not present

## 2013-07-28 ENCOUNTER — Encounter: Payer: Self-pay | Admitting: Vascular Surgery

## 2013-10-06 DIAGNOSIS — R809 Proteinuria, unspecified: Secondary | ICD-10-CM | POA: Diagnosis not present

## 2013-10-06 DIAGNOSIS — E559 Vitamin D deficiency, unspecified: Secondary | ICD-10-CM | POA: Diagnosis not present

## 2013-10-06 DIAGNOSIS — Z23 Encounter for immunization: Secondary | ICD-10-CM | POA: Diagnosis not present

## 2013-10-06 DIAGNOSIS — E7800 Pure hypercholesterolemia, unspecified: Secondary | ICD-10-CM | POA: Diagnosis not present

## 2013-10-06 DIAGNOSIS — I10 Essential (primary) hypertension: Secondary | ICD-10-CM | POA: Diagnosis not present

## 2014-02-11 DIAGNOSIS — I6529 Occlusion and stenosis of unspecified carotid artery: Secondary | ICD-10-CM | POA: Diagnosis not present

## 2014-02-11 DIAGNOSIS — G458 Other transient cerebral ischemic attacks and related syndromes: Secondary | ICD-10-CM | POA: Diagnosis not present

## 2014-02-11 DIAGNOSIS — I739 Peripheral vascular disease, unspecified: Secondary | ICD-10-CM | POA: Diagnosis not present

## 2014-02-11 DIAGNOSIS — I70219 Atherosclerosis of native arteries of extremities with intermittent claudication, unspecified extremity: Secondary | ICD-10-CM | POA: Diagnosis not present

## 2014-02-19 IMAGING — CT CT ANGIO CHEST
1 series · 19 of 32 positions shown · IV contrast (APPLIED)
Comparison: none

REASON FOR EXAM: LABS 1st decreased circulation bilateral upper extr  hx
of atrial atheroscle...
COMMENTS:

PROCEDURE:     [facility] - KCT ANGIOGRAPHY CHEST W/WO  - July 01, 2013  [DATE]
RESULT:     History: Decrease circulation upper extremity.
Study: No prior.

[Series 6: 3 soft tissue · axial · 0.94mm/px · z∈[-678,-315]mm · 19 of 131 slices shown]
[im 5/131  lung]
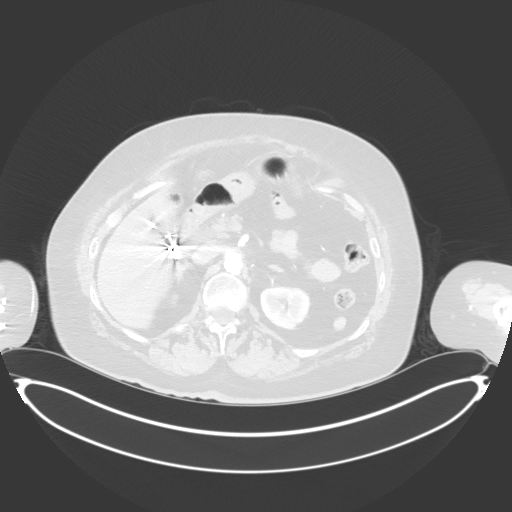
[im 15/131  mediastinal]
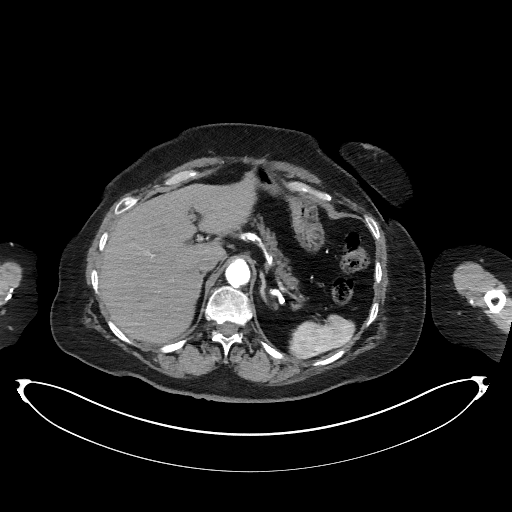
[im 20/131  lung]
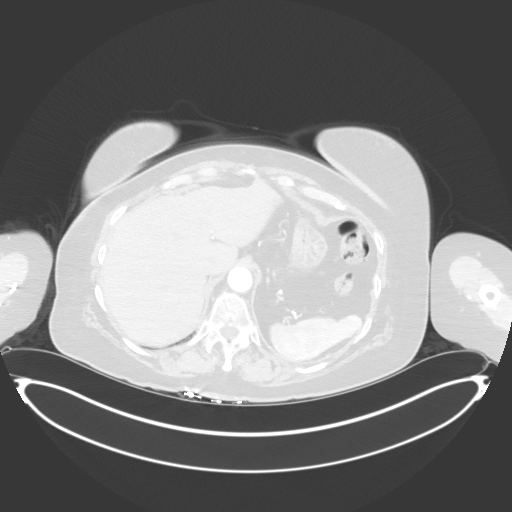
[im 27/131  mediastinal]
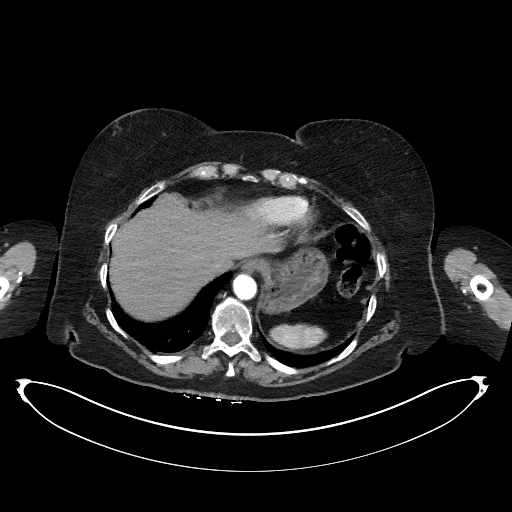
[im 34/131  lung]
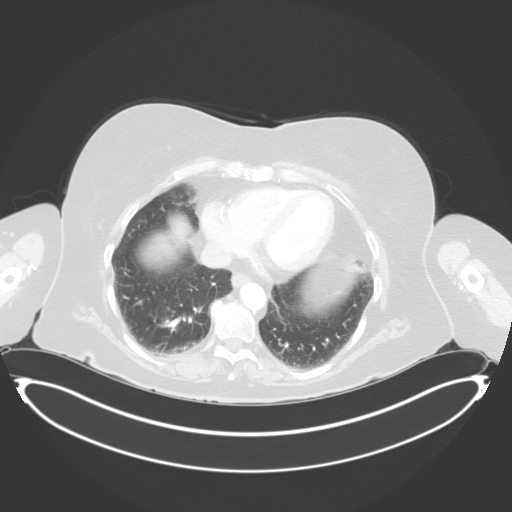
[im 39/131  mediastinal]
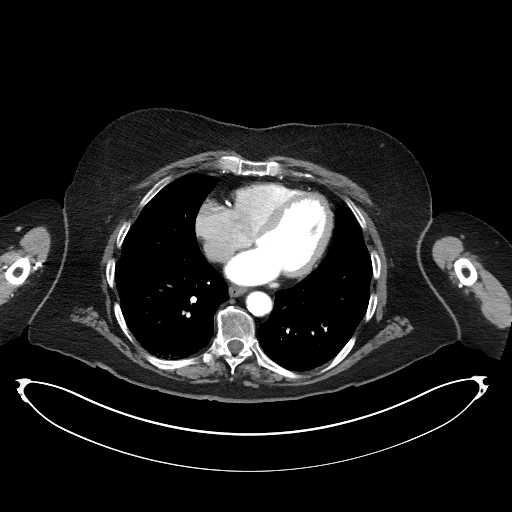
[im 49/131  lung]
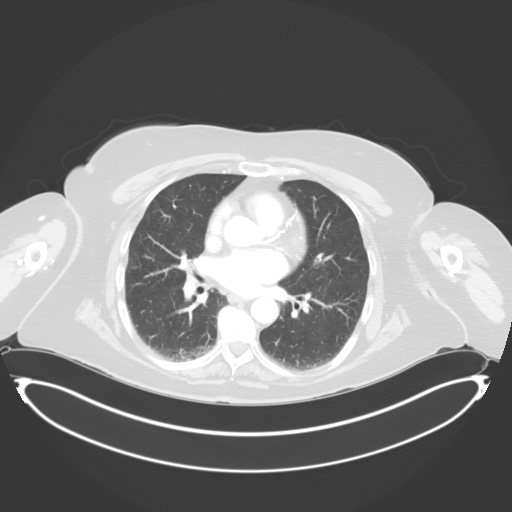
[im 53/131  mediastinal]
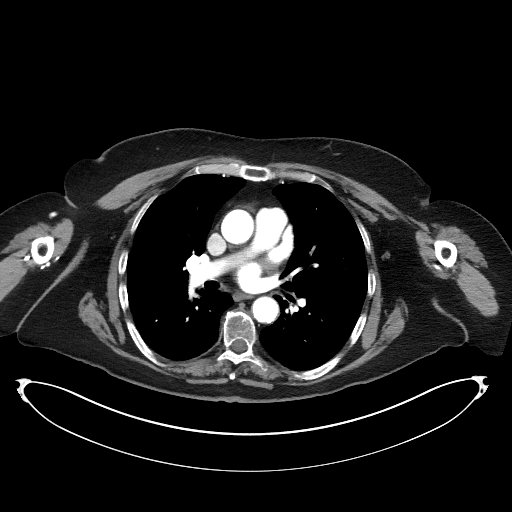
[im 63/131  lung]
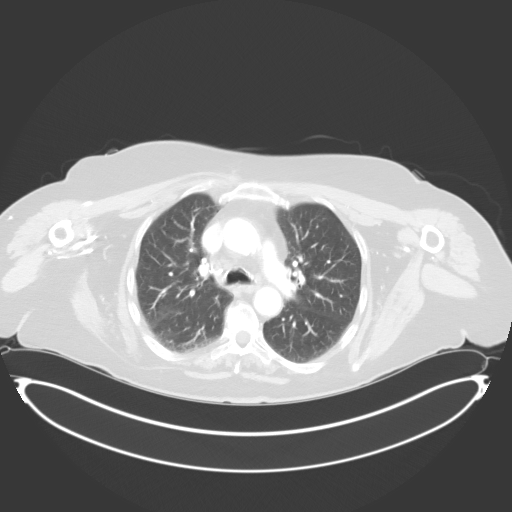
[im 69/131  mediastinal]
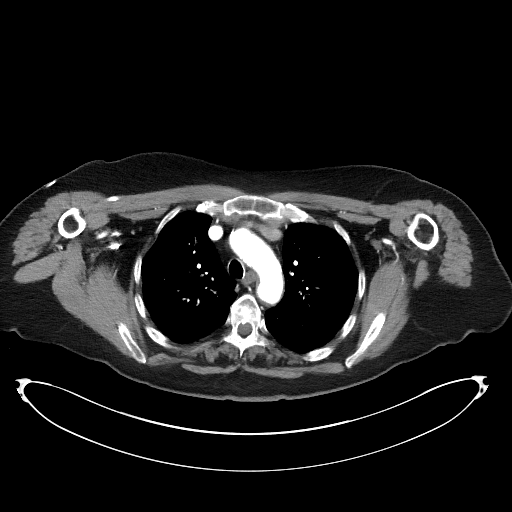
[im 73/131  lung]
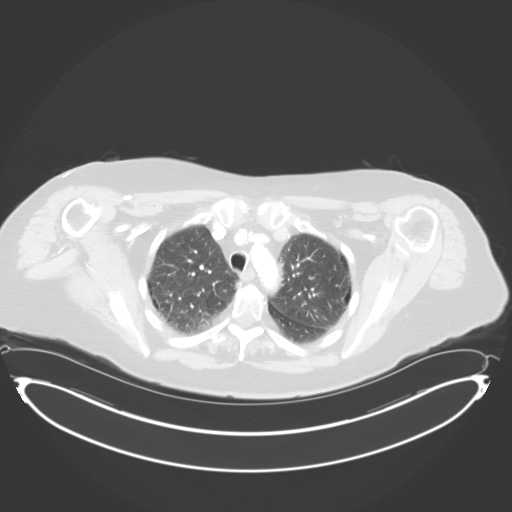
[im 79/131  mediastinal]
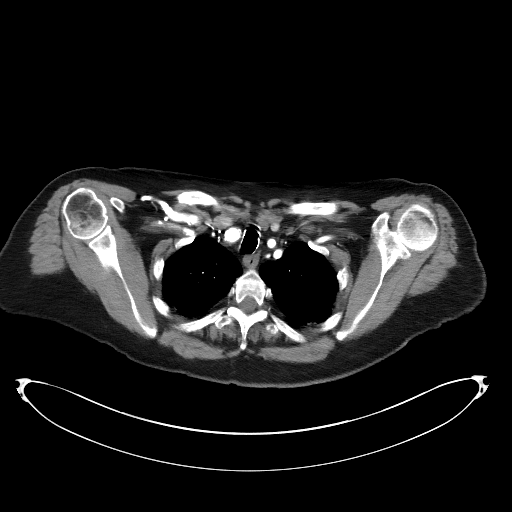
[im 82/131  lung]
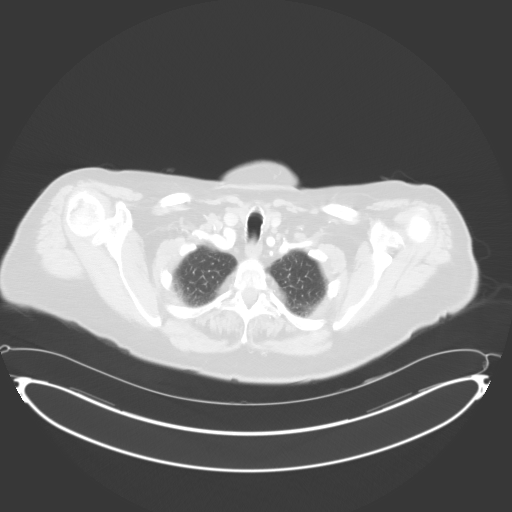
[im 92/131  mediastinal]
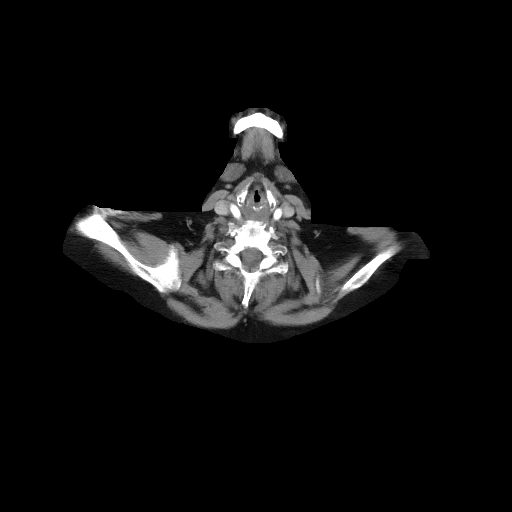
[im 97/131  lung]
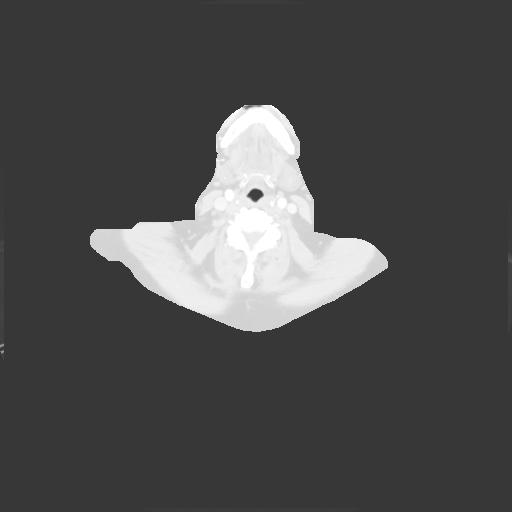
[im 105/131  mediastinal]
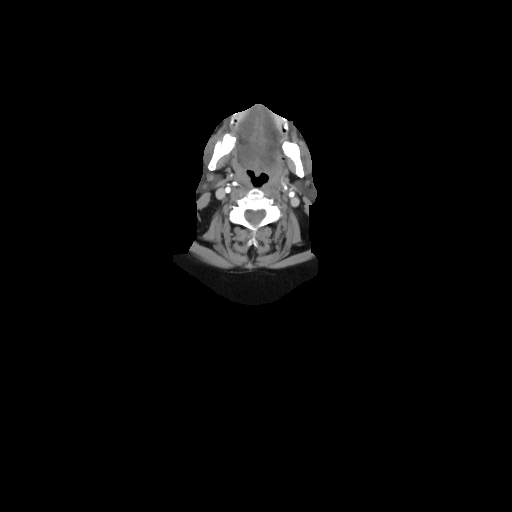
[im 111/131  lung]
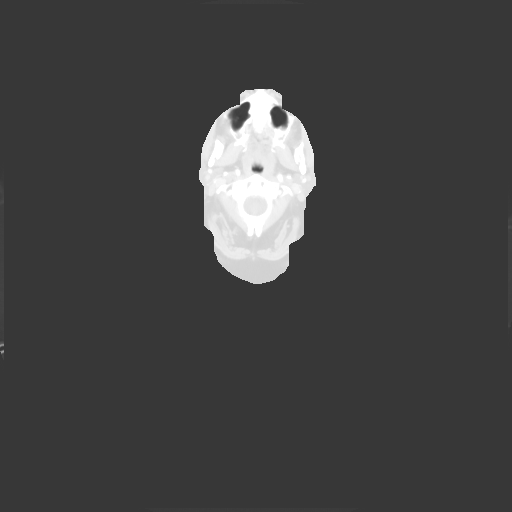
[im 116/131  mediastinal]
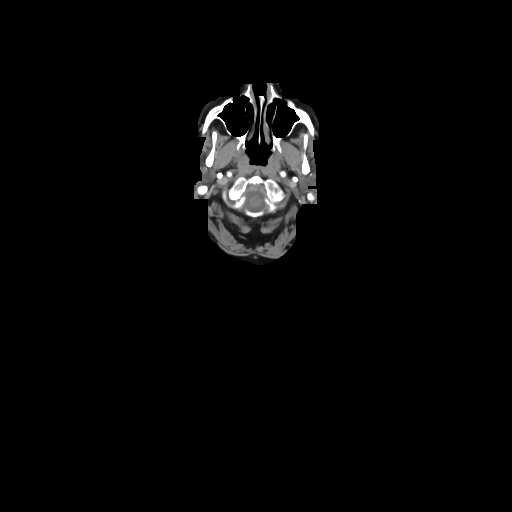
[im 126/131  lung]
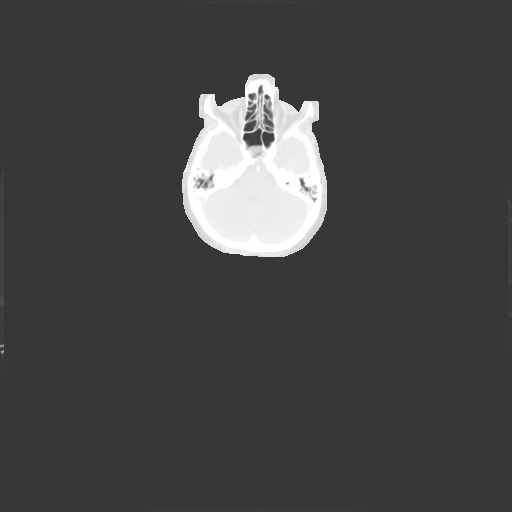

[19 of 32 positions shown; findings below may reference images not displayed]

FINDINGS: Standard CTA performed with 64 cc of Isovue 370. Axial source
images, MIP images, and volume rendered images reviewed Bilateral moderate
carotid bifurcation stenoses are present. Correlation with carotid color
flow duplex exam is suggested. This examination is performed to evaluate the
subclavian arteries. High-grade proximal left subclavian artery stenosis is
present. A moderate origin stenosis is present of the right subclavian
artery. The vertebrals are patent. There is direct origin of the left
vertebral from the aortic arch.
IMPRESSION: 1. High-grade proximal left subclavian artery stenosis. Moderate proximal
right subclavian artery stenosis.
2. Bilateral carotid calcified stenoses . The stenoses are at least moderate
in the 50% range. They could be greater .This exam was performed to evaluate
the subclavian arteries. Please consider carotid color flow duplex Doppler
Doppler of the carotids.

## 2014-04-06 DIAGNOSIS — I10 Essential (primary) hypertension: Secondary | ICD-10-CM | POA: Diagnosis not present

## 2014-04-06 DIAGNOSIS — R809 Proteinuria, unspecified: Secondary | ICD-10-CM | POA: Diagnosis not present

## 2014-04-06 DIAGNOSIS — E7800 Pure hypercholesterolemia, unspecified: Secondary | ICD-10-CM | POA: Diagnosis not present

## 2014-04-06 DIAGNOSIS — I708 Atherosclerosis of other arteries: Secondary | ICD-10-CM | POA: Diagnosis not present

## 2014-05-11 DIAGNOSIS — I10 Essential (primary) hypertension: Secondary | ICD-10-CM | POA: Diagnosis not present

## 2014-05-11 DIAGNOSIS — E559 Vitamin D deficiency, unspecified: Secondary | ICD-10-CM | POA: Diagnosis not present

## 2014-06-07 DIAGNOSIS — N183 Chronic kidney disease, stage 3 unspecified: Secondary | ICD-10-CM | POA: Diagnosis not present

## 2014-06-07 DIAGNOSIS — D631 Anemia in chronic kidney disease: Secondary | ICD-10-CM | POA: Diagnosis not present

## 2014-06-07 DIAGNOSIS — I10 Essential (primary) hypertension: Secondary | ICD-10-CM | POA: Diagnosis not present

## 2014-06-07 DIAGNOSIS — N2581 Secondary hyperparathyroidism of renal origin: Secondary | ICD-10-CM | POA: Diagnosis not present

## 2014-07-01 DIAGNOSIS — N183 Chronic kidney disease, stage 3 unspecified: Secondary | ICD-10-CM | POA: Diagnosis not present

## 2014-07-08 DIAGNOSIS — N039 Chronic nephritic syndrome with unspecified morphologic changes: Secondary | ICD-10-CM | POA: Diagnosis not present

## 2014-07-08 DIAGNOSIS — E785 Hyperlipidemia, unspecified: Secondary | ICD-10-CM | POA: Diagnosis not present

## 2014-07-08 DIAGNOSIS — N2581 Secondary hyperparathyroidism of renal origin: Secondary | ICD-10-CM | POA: Diagnosis not present

## 2014-07-08 DIAGNOSIS — D631 Anemia in chronic kidney disease: Secondary | ICD-10-CM | POA: Diagnosis not present

## 2014-07-08 DIAGNOSIS — I10 Essential (primary) hypertension: Secondary | ICD-10-CM | POA: Diagnosis not present

## 2014-07-08 DIAGNOSIS — N183 Chronic kidney disease, stage 3 unspecified: Secondary | ICD-10-CM | POA: Diagnosis not present

## 2014-08-19 DIAGNOSIS — I771 Stricture of artery: Secondary | ICD-10-CM | POA: Diagnosis not present

## 2014-08-19 DIAGNOSIS — I6529 Occlusion and stenosis of unspecified carotid artery: Secondary | ICD-10-CM | POA: Diagnosis not present

## 2014-08-19 DIAGNOSIS — I10 Essential (primary) hypertension: Secondary | ICD-10-CM | POA: Diagnosis not present

## 2014-08-19 DIAGNOSIS — I70219 Atherosclerosis of native arteries of extremities with intermittent claudication, unspecified extremity: Secondary | ICD-10-CM | POA: Diagnosis not present

## 2014-10-25 DIAGNOSIS — I10 Essential (primary) hypertension: Secondary | ICD-10-CM | POA: Diagnosis not present

## 2014-10-25 DIAGNOSIS — D631 Anemia in chronic kidney disease: Secondary | ICD-10-CM | POA: Diagnosis not present

## 2014-10-25 DIAGNOSIS — N183 Chronic kidney disease, stage 3 (moderate): Secondary | ICD-10-CM | POA: Diagnosis not present

## 2014-10-25 DIAGNOSIS — N2581 Secondary hyperparathyroidism of renal origin: Secondary | ICD-10-CM | POA: Diagnosis not present

## 2014-11-15 DIAGNOSIS — I708 Atherosclerosis of other arteries: Secondary | ICD-10-CM | POA: Diagnosis not present

## 2014-11-15 DIAGNOSIS — Z23 Encounter for immunization: Secondary | ICD-10-CM | POA: Diagnosis not present

## 2014-11-15 DIAGNOSIS — E039 Hypothyroidism, unspecified: Secondary | ICD-10-CM | POA: Diagnosis not present

## 2014-11-15 DIAGNOSIS — R7301 Impaired fasting glucose: Secondary | ICD-10-CM | POA: Diagnosis not present

## 2014-11-15 DIAGNOSIS — E780 Pure hypercholesterolemia: Secondary | ICD-10-CM | POA: Diagnosis not present

## 2015-01-28 DIAGNOSIS — E039 Hypothyroidism, unspecified: Secondary | ICD-10-CM | POA: Diagnosis not present

## 2015-04-25 DIAGNOSIS — I10 Essential (primary) hypertension: Secondary | ICD-10-CM | POA: Diagnosis not present

## 2015-04-25 DIAGNOSIS — N2581 Secondary hyperparathyroidism of renal origin: Secondary | ICD-10-CM | POA: Diagnosis not present

## 2015-04-25 DIAGNOSIS — D631 Anemia in chronic kidney disease: Secondary | ICD-10-CM | POA: Diagnosis not present

## 2015-04-25 DIAGNOSIS — N183 Chronic kidney disease, stage 3 (moderate): Secondary | ICD-10-CM | POA: Diagnosis not present

## 2015-05-16 ENCOUNTER — Other Ambulatory Visit: Payer: Self-pay

## 2015-05-16 MED ORDER — METOPROLOL-HYDROCHLOROTHIAZIDE 100-25 MG PO TABS: 0.5000 | ORAL_TABLET | Freq: Two times a day (BID) | ORAL | Status: DC

## 2015-05-16 NOTE — Telephone Encounter (Signed)
Last visit in [facility] was 6 months ago, last labs reviewed; she's due for visit and labs Please let [name] know that I'd like to see patient for an appointment here in the office for:  HTN, high chol, vit D Please schedule a visit with me  in the next: 1 month Fasting?  Yes, please Thank you, Dr. Sherie Don

## 2015-05-20 ENCOUNTER — Ambulatory Visit: Payer: Self-pay | Admitting: Family Medicine

## 2015-05-23 ENCOUNTER — Other Ambulatory Visit: Payer: Self-pay | Admitting: Family Medicine

## 2015-05-23 ENCOUNTER — Encounter: Payer: Self-pay | Admitting: Family Medicine

## 2015-05-23 ENCOUNTER — Ambulatory Visit (INDEPENDENT_AMBULATORY_CARE_PROVIDER_SITE_OTHER): Payer: Medicare Other | Admitting: Family Medicine

## 2015-05-23 VITALS — BP 140/72 | HR 67 | Temp 98.3°F | Ht 61.0 in | Wt 175.0 lb

## 2015-05-23 DIAGNOSIS — K432 Incisional hernia without obstruction or gangrene: Secondary | ICD-10-CM | POA: Insufficient documentation

## 2015-05-23 DIAGNOSIS — I708 Atherosclerosis of other arteries: Secondary | ICD-10-CM

## 2015-05-23 DIAGNOSIS — R7301 Impaired fasting glucose: Secondary | ICD-10-CM

## 2015-05-23 DIAGNOSIS — Z5181 Encounter for therapeutic drug level monitoring: Secondary | ICD-10-CM

## 2015-05-23 DIAGNOSIS — I10 Essential (primary) hypertension: Secondary | ICD-10-CM | POA: Diagnosis not present

## 2015-05-23 DIAGNOSIS — E559 Vitamin D deficiency, unspecified: Secondary | ICD-10-CM

## 2015-05-23 DIAGNOSIS — E7800 Pure hypercholesterolemia, unspecified: Secondary | ICD-10-CM | POA: Insufficient documentation

## 2015-05-23 DIAGNOSIS — E669 Obesity, unspecified: Secondary | ICD-10-CM

## 2015-05-23 DIAGNOSIS — I7090 Unspecified atherosclerosis: Secondary | ICD-10-CM

## 2015-05-23 DIAGNOSIS — E663 Overweight: Secondary | ICD-10-CM | POA: Insufficient documentation

## 2015-05-23 NOTE — Patient Instructions (Addendum)
Your goal blood pressure is less than 140 mmHg Try to follow the DASH guidelines (DASH stands for Dietary Approaches to Stop Hypertension) Try to limit the sodium in your diet.  Ideally, consume less than 1.5 grams (less than 1,500mg ) per day. Do not add salt when cooking or at the table.  Check the sodium amount on labels when shopping, and choose items lower in sodium when given a choice. Avoid or limit foods that already contain a lot of sodium. Eat a diet rich in fruits and vegetables and whole grains. Limit saturated fats (bacon, sausage, eggs, cheese, etc.) Limit sweets and white bread and white rice Keep up the great job with weight loss You can access your labs through MyChart or I can send you a letter Please do let your kidney doctor know about the increased swelling in the legs Continue your vitamin D supplements Return for Medicare wellness visit when due

## 2015-05-23 NOTE — Telephone Encounter (Signed)
E-Fax came through for refill on: Rx: rosuvastatin (CRESTOR) 10 MG tablet Rx in basket.

## 2015-05-23 NOTE — Assessment & Plan Note (Signed)
Goal LDL less than 70; continue statin, aspirin

## 2015-05-23 NOTE — Assessment & Plan Note (Signed)
Encouragement given regarding her weight loss efforts

## 2015-05-23 NOTE — Telephone Encounter (Signed)
Routing to provider  

## 2015-05-23 NOTE — Assessment & Plan Note (Signed)
Reducible; explained reasons to seek immediate medical care, described s/s of incarceration

## 2015-05-23 NOTE — Assessment & Plan Note (Signed)
Nearly at goal; seeing nephrologist; pt to call that office about increased leg edema since increasing CCB; elevate legs, limit salt

## 2015-05-23 NOTE — Assessment & Plan Note (Signed)
Limit sweets, continue weight loss efforts; check A1C

## 2015-05-23 NOTE — Assessment & Plan Note (Signed)
Check lipids; goal LDL less than 70; continue statin; limit saturated fats; continue to work on weight loss

## 2015-05-23 NOTE — Progress Notes (Signed)
BP 140/72 mmHg  Pulse 67  Temp(Src) 98.3 F (36.8 C)  Ht 5\' 1"  (1.549 m)  Wt 175 lb (79.379 kg)  BMI 33.08 kg/m2  SpO2 100%   Subjective:    Patient ID: Janice [name], female    DOB: 06/05/1941, 74 y.o.   MRN: [id number]  HPI: Janice [name] is a 74 y.o. female Here for six month check-up  Chief Complaint  Patient presents with  . Hyperlipidemia  . Hypertension  . Hypothyroidism  . Hyperglycemia    IFG   High cholesterol Patient has had high cholesterol since about age 83 Dietary intake: Do you eat more than 3 eggs per week  no Do you try to limit saturated fats in your diet?  yes Exercise: Exercise/activity level:  seldom (a few times a month), does some walking and watches grandkids Does high cholesterol run in your family?   YES Weight:  If overweight or obese, are you trying to lose weight?  yes; she is working on it; lost 8 pounds since last visit, this is intentional No side effects from the Crestor  Hypertension Patient has had hypertension since her 30s Checking blood pressure away from here?  yes How often? Not checking right now, machine is broken Hypertension-associated complications:  nephropathy If taking medicines, are you taking them regularly?  yes Siblings / family history: Does high blood pressure run in your family?   YES Salt:  Trying to limit sodium / salt when buying foods at the grocery store?  yes  Do you try to limit added salt when cooking and at the table?  yes Sweets/licorice:  Do you eat a lot of sweets or eat black licorice?  no Saturated fats: Do you eat a lot of foods like bacon, sausage, pepperoni, cheeseburgers, hot dogs, bologna, and cheese?  no Sedentary lifestyle:  Exercise/activity level:  seldom (a few times a month) Steroids/Non-steroidals:  Have you had a recent cortisone shot in the last few months?  no  Do you take prednisone or prescription NSAIDs, no, or take OTCS NSAIDs such as ibuprofen, Motrin, Advil, Aleve, or  naproxen? no Smoking: Do you smoke?  no Snoring / sleep apnea: Do you snore or have sleep apnea?  no Stress: Do you feel like you are under excessive stress or that your stress level affects your blood pressure at times?    no Stroh's (alcohol): Do you drink alcohol  yes  --- if yes, do you drink more than 14 drinks/week if female or more than 7 drinks/week if female?  no Sudafed (decongestants): Do you use any OTC decongestant products like Allegra-D, Claritin-D, Zyrtec-D, Tylenol Cold and Sinus, etc.?  no  She saw the kidney doctor 3 weeks ago; she was on 5 mg and he increased it to 10 mg and now has swelling in both feet and ankles  She has an abdominal hernia since surgery, five years ago, incisional  Relevant past medical, surgical, family and social history reviewed and updated as indicated. Interim medical history since our last visit reviewed. Allergies and medications reviewed and updated.  Review of Systems  Constitutional: Negative for unexpected weight change (all intentional weight loss).  Respiratory: Negative for chest tightness and shortness of breath.   Cardiovascular: Positive for leg swelling (since increasing CCB).  Gastrointestinal: Negative for blood in stool.  Genitourinary: Negative for hematuria.  Psychiatric/Behavioral: The patient is not nervous/anxious.     Per HPI unless specifically indicated above     Objective:  BP 140/72 mmHg  Pulse 67  Temp(Src) 98.3 F (36.8 C)  Ht  (1.549 m)  Wt 175 lb (79.379 kg)  BMI 33.08 kg/m2  SpO2 100%  Wt Readings from Last 3 Encounters:  05/23/15 175 lb (79.379 kg)  11/15/14 183 lb (83.008 kg)    Physical Exam  Constitutional: She appears well-developed and well-nourished. No distress.  HENT:  Head: Normocephalic and atraumatic.  Eyes: EOM are normal. No scleral icterus.  Neck: No thyromegaly present.  Cardiovascular: Normal rate, regular rhythm and normal heart sounds.   No murmur  heard. Pulmonary/Chest: Effort normal and breath sounds normal. No respiratory distress. She has no wheezes.  Abdominal: Soft. Bowel sounds are normal. She exhibits no distension.  Ventral incisional hernia, easily reducible  Musculoskeletal: Normal range of motion. She exhibits no edema (bilateral nonpitting pedal and lower extremity edema; no erythema).  Neurological: She is alert. She exhibits normal muscle tone.  Skin: Skin is warm and dry. She is not diaphoretic. No pallor.  Psychiatric: She has a normal mood and affect. Her behavior is normal. Judgment and thought content normal.      Assessment & Plan:   Problem List Items Addressed This Visit      Cardiovascular and Mediastinum   Atherosclerosis of arteries - Primary    Goal LDL less than 70; continue statin, aspirin      Relevant Medications   amLODipine (NORVASC) 10 MG tablet   Benign hypertension    Nearly at goal; seeing nephrologist; pt to call that office about increased leg edema since increasing CCB; elevate legs, limit salt      Relevant Medications   amLODipine (NORVASC) 10 MG tablet     Endocrine   IFG (impaired fasting glucose)    Limit sweets, continue weight loss efforts; check A1C      Relevant Orders   Hgb A1c w/o eAG     Other   Hypercholesteremia    Check lipids; goal LDL less than 70; continue statin; limit saturated fats; continue to work on weight loss      Relevant Medications   amLODipine (NORVASC) 10 MG tablet   Other Relevant Orders   Lipid Panel w/o Chol/HDL Ratio   Obesity    Encouragement given regarding her weight loss efforts      Ventral incisional hernia    Reducible; explained reasons to seek immediate medical care, described s/s of incarceration       Other Visit Diagnoses    Vitamin D deficiency        continue supplementation    Medication monitoring encounter        check hepatic function panel; renal doctor recently checked kidney function so I did not order a  full CMP    Relevant Orders    Hepatic function panel       Follow up plan: Return in about 6 months (around 11/22/2015) for fasting labs, visit; ALSO Medicare visit when due.

## 2015-05-24 ENCOUNTER — Encounter: Payer: Self-pay | Admitting: Family Medicine

## 2015-05-24 LAB — HEPATIC FUNCTION PANEL
ALT: 16 IU/L (ref 0–32)
AST: 19 IU/L (ref 0–40)
Albumin: 4.1 g/dL (ref 3.5–4.8)
Alkaline Phosphatase: 80 IU/L (ref 39–117)
Bilirubin Total: 0.2 mg/dL (ref 0.0–1.2)
Bilirubin, Direct: 0.11 mg/dL (ref 0.00–0.40)
Total Protein: 7.1 g/dL (ref 6.0–8.5)

## 2015-05-24 LAB — LIPID PANEL W/O CHOL/HDL RATIO
CHOLESTEROL TOTAL: 113 mg/dL (ref 100–199)
HDL: 49 mg/dL (ref >39)
LDL CALC: 38 mg/dL (ref 0–99)
Triglycerides: 130 mg/dL (ref 0–149)
VLDL CHOLESTEROL CAL: 26 mg/dL (ref 5–40)

## 2015-05-24 LAB — HGB A1C W/O EAG: HEMOGLOBIN A1C: 6.1 % — AB (ref 4.8–5.6)

## 2015-05-25 ENCOUNTER — Other Ambulatory Visit: Payer: Self-pay | Admitting: Family Medicine

## 2015-05-25 MED ORDER — ROSUVASTATIN CALCIUM 10 MG PO TABS: 10.0000 mg | ORAL_TABLET | Freq: Every day | ORAL | Status: DC

## 2015-05-25 NOTE — Telephone Encounter (Signed)
Done

## 2015-05-25 NOTE — Telephone Encounter (Signed)
[facility] in [place] called states pt needs refill on:  Crestor   Thanks.

## 2015-05-25 NOTE — Telephone Encounter (Signed)
Duplicate; 6 months approved earlier today Sig should say at daily bedtime, not just daily Please make sure all statins are at bedtime in the sig Please resolve with pharmacy and make sure they have the 30+5, one daily at bedtime

## 2015-05-25 NOTE — Telephone Encounter (Signed)
Routing to provider  

## 2015-06-16 ENCOUNTER — Other Ambulatory Visit: Payer: Self-pay | Admitting: Family Medicine

## 2015-06-16 NOTE — Telephone Encounter (Signed)
Routing to provider  

## 2015-06-20 ENCOUNTER — Other Ambulatory Visit: Payer: Self-pay | Admitting: Family Medicine

## 2015-06-20 NOTE — Telephone Encounter (Signed)
Pt has taken last pill and would like to come up to get refill on metoprolol hctz. Pt would like a call when ready.

## 2015-06-20 NOTE — Telephone Encounter (Signed)
Routing to provider  

## 2015-06-20 NOTE — Telephone Encounter (Signed)
Med was already refilled.

## 2015-06-21 ENCOUNTER — Other Ambulatory Visit: Payer: Self-pay | Admitting: Family Medicine

## 2015-06-21 NOTE — Telephone Encounter (Signed)
Duplicate request. Med already refilled.

## 2015-07-18 ENCOUNTER — Encounter: Payer: Medicare Other | Admitting: Family Medicine

## 2015-10-31 DIAGNOSIS — R809 Proteinuria, unspecified: Secondary | ICD-10-CM | POA: Diagnosis not present

## 2015-10-31 DIAGNOSIS — N183 Chronic kidney disease, stage 3 (moderate): Secondary | ICD-10-CM | POA: Diagnosis not present

## 2015-10-31 DIAGNOSIS — N2581 Secondary hyperparathyroidism of renal origin: Secondary | ICD-10-CM | POA: Diagnosis not present

## 2015-10-31 DIAGNOSIS — I129 Hypertensive chronic kidney disease with stage 1 through stage 4 chronic kidney disease, or unspecified chronic kidney disease: Secondary | ICD-10-CM | POA: Diagnosis not present

## 2015-11-21 ENCOUNTER — Encounter: Payer: Self-pay | Admitting: *Deleted

## 2015-11-21 ENCOUNTER — Ambulatory Visit: Payer: Medicare Other | Admitting: Family Medicine

## 2015-11-21 ENCOUNTER — Encounter: Payer: Self-pay | Admitting: Family Medicine

## 2015-11-21 ENCOUNTER — Ambulatory Visit (INDEPENDENT_AMBULATORY_CARE_PROVIDER_SITE_OTHER): Payer: Medicare Other | Admitting: Family Medicine

## 2015-11-21 VITALS — BP 129/85 | HR 52 | Temp 98.5°F | Wt 174.0 lb

## 2015-11-21 DIAGNOSIS — I10 Essential (primary) hypertension: Secondary | ICD-10-CM | POA: Diagnosis not present

## 2015-11-21 DIAGNOSIS — E669 Obesity, unspecified: Secondary | ICD-10-CM | POA: Diagnosis not present

## 2015-11-21 DIAGNOSIS — E039 Hypothyroidism, unspecified: Secondary | ICD-10-CM | POA: Insufficient documentation

## 2015-11-21 DIAGNOSIS — Z5181 Encounter for therapeutic drug level monitoring: Secondary | ICD-10-CM

## 2015-11-21 DIAGNOSIS — R231 Pallor: Secondary | ICD-10-CM

## 2015-11-21 DIAGNOSIS — R7301 Impaired fasting glucose: Secondary | ICD-10-CM | POA: Diagnosis not present

## 2015-11-21 DIAGNOSIS — E7800 Pure hypercholesterolemia, unspecified: Secondary | ICD-10-CM | POA: Diagnosis not present

## 2015-11-21 DIAGNOSIS — K432 Incisional hernia without obstruction or gangrene: Secondary | ICD-10-CM

## 2015-11-21 NOTE — Assessment & Plan Note (Addendum)
encouragement given to keep working on weight loss, goal BMI less than 30 as a starting point

## 2015-11-21 NOTE — Progress Notes (Signed)
BP 129/85 mmHg  Pulse 52  Temp(Src) 98.5 F (36.9 C)  Wt 174 lb (78.926 kg)  SpO2 99%   Subjective:    Patient ID: Janice [name], female    DOB: [birth year], 74 y.o.   MRN: [id number]  HPI: Janice [name] is a 74 y.o. female  Chief Complaint  Patient presents with  . Hypertension    "I'm just here for my 6 month follow up" She recently had labs done thru [facility].  Marland Kitchen Hyperlipidemia  . Hypothyroidism  . URI    I have a cold.   She has hypertension, labs just done with [facility], and her GFR was up to 56; her BP today was 129; usually higher at the kidney doctor; she is avoiding NSAIDs; staying pretty well-hydrated  High cholesterol; no eggs; no processed pork, occasional pork chop; does eat red meat twice a week; thinks she gets fruits and veggies and oatmeal  Prediabetes; not drinking lots of sugary drinks; bread is whole wheat; rice is white; she is a little active; lost another three pounds and she is glad about that  She asked about her hernia; Dr. Luther [name] would like her to see a surgeon; never gets hard or turns purple; no nausea or vomiting  Got a cold about a week ago; she has a cough and runny nose; no fevers; no body aches like flu; she felt chilled one day; throat is just irritated; no sinus drainage; no ear problems; no rash; taking Coricidin  Thyroid condition; just pooped out on its own; taking medicine regularly; no constipation; energy level is alright  Relevant past medical, surgical, family and social history reviewed and updated as indicated. Interim medical history since our last visit reviewed. Allergies and medications reviewed and updated.  Review of Systems Per HPI unless specifically indicated above     Objective:    BP 129/85 mmHg  Pulse 52  Temp(Src) 98.5 F (36.9 C)  Wt 174 lb (78.926 kg)  SpO2 99%  Wt Readings from Last 3 Encounters:  11/21/15 174 lb (78.926 kg)  05/23/15 175 lb (79.379 kg)  11/15/14 183 lb (83.008 kg)  body mass index is 32.89  kg/(m^2).  Physical Exam  Constitutional: She appears well-developed and well-nourished. No distress.  HENT:  Head: Normocephalic and atraumatic.  Eyes: EOM are normal. No scleral icterus.  Neck: No thyromegaly present.  Cardiovascular: Normal rate, regular rhythm and normal heart sounds.   No murmur heard. Pulmonary/Chest: Effort normal and breath sounds normal. No respiratory distress. She has no wheezes.  Abdominal: Soft. Bowel sounds are normal. She exhibits no distension. A hernia is present. Hernia confirmed positive in the ventral area.  Musculoskeletal: Normal range of motion. She exhibits no edema.  Neurological: She is alert. She exhibits normal muscle tone.  Skin: Skin is warm and dry. She is not diaphoretic. No pallor.  Nailbeds are pale relative to examiner  Psychiatric: She has a normal mood and affect. Her behavior is normal. Judgment and thought content normal.      Assessment & Plan:   Problem List Items Addressed This Visit      Cardiovascular and Mediastinum   Benign hypertension - Primary    BP actually well-controlled today; weight loss and DASH guidelines      Relevant Medications   losartan (COZAAR) 50 MG tablet     Endocrine   IFG (impaired fasting glucose)    Work on weight loss and limiting sweets and simple carbs; check A1c today  Relevant Orders   Hgb A1c w/o eAG (Completed)   Hypothyroidism    Check TSH and adjust medicine if needed      Relevant Orders   TSH (Completed)     Other   Hypercholesteremia    Limit sat fats; check lipids today; weight loss and activity      Relevant Medications   losartan (COZAAR) 50 MG tablet   Other Relevant Orders   Lipid Panel w/o Chol/HDL Ratio (Completed)   Obesity    encouragement given to keep working on weight loss, goal BMI less than 30 as a starting point      Ventral incisional hernia    Refer to general surgeon; discussed risk of incarcerated hernia, reasons to seek immediate medical  attention      Relevant Orders   Ambulatory referral to General Surgery   Medication monitoring encounter    Check sgpt on the statin, check creatinine and K+ on the ARB, check creatinine and electrolytes on the thiazide diuretic      Relevant Orders   CBC with Differential/Platelet (Completed)   Comprehensive metabolic panel (Completed)    Other Visit Diagnoses    Pallor        Relevant Orders    CBC with Differential/Platelet (Completed)        Follow up plan: Return in about 6 months (around 05/21/2016) for 30 minute visit prediabetes, blood pressure, chol.  Orders Placed This Encounter  Procedures  . CBC with Differential/Platelet  . Comprehensive metabolic panel  . Lipid Panel w/o Chol/HDL Ratio  . TSH  . Hgb A1c w/o eAG  . Ambulatory referral to General Surgery   An after-visit summary was printed and given to the patient at check-out.  Please see the patient instructions which may contain other information and recommendations beyond what is mentioned above in the assessment and plan.

## 2015-11-21 NOTE — Assessment & Plan Note (Addendum)
Refer to general surgeon; discussed risk of incarcerated hernia, reasons to seek immediate medical attention

## 2015-11-21 NOTE — Assessment & Plan Note (Signed)
Work on weight loss and limiting sweets and simple carbs; check A1c today

## 2015-11-21 NOTE — Assessment & Plan Note (Signed)
Limit sat fats; check lipids today; weight loss and activity

## 2015-11-21 NOTE — Patient Instructions (Addendum)
Try vitamin C (orange juice if not diabetic or vitamin C tablets) and drink green tea to help your immune system during your illness Get plenty of rest and hydration Try to use PLAIN allergy medicine without the decongestant Avoid: phenylephrine, phenylpropanolamine, and pseudoephredine We'll get labs today and contact you If the hernia ever gets hard or you develop nausea and vomiting and abdominal pain, go right to the ER Try to follow the DASH guidelines (DASH stands for Dietary Approaches to Stop Hypertension) Try to limit the sodium in your diet.  Ideally, consume less than 1.5 grams (less than 1,500mg ) per day. Do not add salt when cooking or at the table.  Check the sodium amount on labels when shopping, and choose items lower in sodium when given a choice. Avoid or limit foods that already contain a lot of sodium. Eat a diet rich in fruits and vegetables and whole grains.  Check out the information at familydoctor.org entitled "What It Takes to Lose Weight" Try to lose between 1-2 pounds per week by taking in fewer calories and burning off more calories You can succeed by limiting portions, limiting foods dense in calories and fat, becoming more active, and drinking 8 glasses of water a day (64 ounces) Don't skip meals, especially breakfast, as skipping meals may alter your metabolism Do not use over-the-counter weight loss pills or gimmicks that claim rapid weight loss A healthy BMI (or body mass index) is between 18.5 and 24.9 You can calculate your ideal BMI at the NIH website JobEconomics.huhttp://www.nhlbi.nih.gov/health/educational/lose_wt/BMI/bmicalc.htm   DASH Eating Plan DASH stands for "Dietary Approaches to Stop Hypertension." The DASH eating plan is a healthy eating plan that has been shown to reduce high blood pressure (hypertension). Additional health benefits may include reducing the risk of type 2 diabetes mellitus, heart disease, and stroke. The DASH eating plan may also help with  weight loss. WHAT DO I NEED TO KNOW ABOUT THE DASH EATING PLAN? For the DASH eating plan, you will follow these general guidelines:  Choose foods with a percent daily value for sodium of less than 5% (as listed on the food label).  Use salt-free seasonings or herbs instead of table salt or sea salt.  Check with your health care provider or pharmacist before using salt substitutes.  Eat lower-sodium products, often labeled as "lower sodium" or "no salt added."  Eat fresh foods.  Eat more vegetables, fruits, and low-fat dairy products.  Choose whole grains. Look for the word "whole" as the first word in the ingredient list.  Choose fish and skinless chicken or [place] more often than red meat. Limit fish, poultry, and meat to 6 oz (170 g) each day.  Limit sweets, desserts, sugars, and sugary drinks.  Choose heart-healthy fats.  Limit cheese to 1 oz (28 g) per day.  Eat more home-cooked food and less restaurant, buffet, and fast food.  Limit fried foods.  Cook foods using methods other than frying.  Limit canned vegetables. If you do use them, rinse them well to decrease the sodium.  When eating at a restaurant, ask that your food be prepared with less salt, or no salt if possible. WHAT FOODS CAN I EAT? Seek help from a dietitian for individual calorie needs. Grains Whole grain or whole wheat bread. Brown rice. Whole grain or whole wheat pasta. Quinoa, bulgur, and whole grain cereals. Low-sodium cereals. Corn or whole wheat flour tortillas. Whole grain cornbread. Whole grain crackers. Low-sodium crackers. Vegetables Fresh or frozen vegetables (raw, steamed, roasted,  or grilled). Low-sodium or reduced-sodium tomato and vegetable juices. Low-sodium or reduced-sodium tomato sauce and paste. Low-sodium or reduced-sodium canned vegetables.  Fruits All fresh, canned (in natural juice), or frozen fruits. Meat and Other Protein Products Ground beef (85% or leaner), grass-fed beef, or  beef trimmed of fat. Skinless chicken or [place]. Ground chicken or [place]. Pork trimmed of fat. All fish and seafood. Eggs. Dried beans, peas, or lentils. Unsalted nuts and seeds. Unsalted canned beans. Dairy Low-fat dairy products, such as skim or 1% milk, 2% or reduced-fat cheeses, low-fat ricotta or cottage cheese, or plain low-fat yogurt. Low-sodium or reduced-sodium cheeses. Fats and Oils Tub margarines without trans fats. Light or reduced-fat mayonnaise and salad dressings (reduced sodium). Avocado. Safflower, olive, or canola oils. Natural peanut or almond butter. Other Unsalted popcorn and pretzels. The items listed above may not be a complete list of recommended foods or beverages. Contact your dietitian for more options. WHAT FOODS ARE NOT RECOMMENDED? Grains White bread. White pasta. White rice. Refined cornbread. Bagels and croissants. Crackers that contain trans fat. Vegetables Creamed or fried vegetables. Vegetables in a cheese sauce. Regular canned vegetables. Regular canned tomato sauce and paste. Regular tomato and vegetable juices. Fruits Dried fruits. Canned fruit in light or heavy syrup. Fruit juice. Meat and Other Protein Products Fatty cuts of meat. Ribs, chicken wings, bacon, sausage, bologna, salami, chitterlings, fatback, hot dogs, bratwurst, and packaged luncheon meats. Salted nuts and seeds. Canned beans with salt. Dairy Whole or 2% milk, cream, half-and-half, and cream cheese. Whole-fat or sweetened yogurt. Full-fat cheeses or blue cheese. Nondairy creamers and whipped toppings. Processed cheese, cheese spreads, or cheese curds. Condiments Onion and garlic salt, seasoned salt, table salt, and sea salt. Canned and packaged gravies. Worcestershire sauce. Tartar sauce. Barbecue sauce. Teriyaki sauce. Soy sauce, including reduced sodium. Steak sauce. Fish sauce. Oyster sauce. Cocktail sauce. Horseradish. Ketchup and mustard. Meat flavorings and tenderizers. Bouillon cubes.  Hot sauce. Tabasco sauce. Marinades. Taco seasonings. Relishes. Fats and Oils Butter, stick margarine, lard, shortening, ghee, and bacon fat. Coconut, palm kernel, or palm oils. Regular salad dressings. Other Pickles and olives. Salted popcorn and pretzels. The items listed above may not be a complete list of foods and beverages to avoid. Contact your dietitian for more information. WHERE CAN I FIND MORE INFORMATION? National Heart, Lung, and Blood Institute: CablePromo.it   This information is not intended to replace advice given to you by your health care provider. Make sure you discuss any questions you have with your health care provider.   Document Released: 11/08/2011 Document Revised: 12/10/2014 Document Reviewed: 09/23/2013 Elsevier Interactive Patient Education [employer]

## 2015-11-21 NOTE — Assessment & Plan Note (Signed)
BP actually well-controlled today; weight loss and DASH guidelines

## 2015-11-22 ENCOUNTER — Encounter: Payer: Self-pay | Admitting: Family Medicine

## 2015-11-22 LAB — COMPREHENSIVE METABOLIC PANEL
ALK PHOS: 94 IU/L (ref 39–117)
ALT: 10 IU/L (ref 0–32)
AST: 14 IU/L (ref 0–40)
Albumin/Globulin Ratio: 1.4 (ref 1.1–2.5)
Albumin: 4.2 g/dL (ref 3.5–4.8)
BUN/Creatinine Ratio: 15 (ref 11–26)
BUN: 17 mg/dL (ref 8–27)
Bilirubin Total: 0.3 mg/dL (ref 0.0–1.2)
CO2: 26 mmol/L (ref 18–29)
CREATININE: 1.1 mg/dL — AB (ref 0.57–1.00)
Calcium: 9.8 mg/dL (ref 8.7–10.3)
Chloride: 98 mmol/L (ref 96–106)
GFR calc Af Amer: 56 mL/min/{1.73_m2} — ABNORMAL LOW (ref >59)
GFR calc non Af Amer: 49 mL/min/{1.73_m2} — ABNORMAL LOW (ref >59)
GLOBULIN, TOTAL: 3 g/dL (ref 1.5–4.5)
GLUCOSE: 116 mg/dL — AB (ref 65–99)
POTASSIUM: 4.7 mmol/L (ref 3.5–5.2)
SODIUM: 140 mmol/L (ref 134–144)
Total Protein: 7.2 g/dL (ref 6.0–8.5)

## 2015-11-22 LAB — CBC WITH DIFFERENTIAL/PLATELET
BASOS ABS: 0.1 10*3/uL (ref 0.0–0.2)
Basos: 1 %
EOS (ABSOLUTE): 0.2 10*3/uL (ref 0.0–0.4)
EOS: 3 %
HEMATOCRIT: 35.8 % (ref 34.0–46.6)
Hemoglobin: 11.8 g/dL (ref 11.1–15.9)
Immature Grans (Abs): 0 10*3/uL (ref 0.0–0.1)
Immature Granulocytes: 0 %
LYMPHS ABS: 1.5 10*3/uL (ref 0.7–3.1)
LYMPHS: 18 %
MCH: 29.1 pg (ref 26.6–33.0)
MCHC: 33 g/dL (ref 31.5–35.7)
MCV: 88 fL (ref 79–97)
MONOCYTES: 8 %
MONOS ABS: 0.7 10*3/uL (ref 0.1–0.9)
NEUTROS PCT: 70 %
Neutrophils Absolute: 5.8 10*3/uL (ref 1.4–7.0)
PLATELETS: 227 10*3/uL (ref 150–379)
RBC: 4.05 x10E6/uL (ref 3.77–5.28)
RDW: 14.1 % (ref 12.3–15.4)
WBC: 8.3 10*3/uL (ref 3.4–10.8)

## 2015-11-22 LAB — LIPID PANEL W/O CHOL/HDL RATIO
CHOLESTEROL TOTAL: 147 mg/dL (ref 100–199)
HDL: 51 mg/dL (ref >39)
LDL CALC: 71 mg/dL (ref 0–99)
TRIGLYCERIDES: 123 mg/dL (ref 0–149)
VLDL Cholesterol Cal: 25 mg/dL (ref 5–40)

## 2015-11-22 LAB — TSH: TSH: 3.46 u[IU]/mL (ref 0.450–4.500)

## 2015-11-22 LAB — HGB A1C W/O EAG: Hgb A1c MFr Bld: 6.2 % — ABNORMAL HIGH (ref 4.8–5.6)

## 2015-11-26 ENCOUNTER — Other Ambulatory Visit: Payer: Self-pay | Admitting: Family Medicine

## 2015-11-26 NOTE — Assessment & Plan Note (Signed)
Check TSH and adjust medicine if needed 

## 2015-11-26 NOTE — Telephone Encounter (Signed)
Lipids and SGPT from Nov 21, 2015 reviewed; Rx approved

## 2015-11-26 NOTE — Assessment & Plan Note (Signed)
Check sgpt on the statin, check creatinine and K+ on the ARB, check creatinine and electrolytes on the thiazide diuretic

## 2015-12-06 ENCOUNTER — Ambulatory Visit: Payer: Self-pay | Admitting: General Surgery

## 2015-12-13 DIAGNOSIS — R109 Unspecified abdominal pain: Secondary | ICD-10-CM | POA: Diagnosis not present

## 2015-12-13 DIAGNOSIS — I10 Essential (primary) hypertension: Secondary | ICD-10-CM | POA: Diagnosis not present

## 2015-12-13 DIAGNOSIS — K5669 Other intestinal obstruction: Secondary | ICD-10-CM | POA: Diagnosis not present

## 2015-12-13 DIAGNOSIS — K5660 Unspecified intestinal obstruction: Secondary | ICD-10-CM | POA: Diagnosis not present

## 2015-12-13 DIAGNOSIS — R1013 Epigastric pain: Secondary | ICD-10-CM | POA: Diagnosis not present

## 2015-12-13 DIAGNOSIS — K436 Other and unspecified ventral hernia with obstruction, without gangrene: Secondary | ICD-10-CM | POA: Diagnosis not present

## 2015-12-13 DIAGNOSIS — E785 Hyperlipidemia, unspecified: Secondary | ICD-10-CM | POA: Diagnosis not present

## 2015-12-13 DIAGNOSIS — I739 Peripheral vascular disease, unspecified: Secondary | ICD-10-CM | POA: Diagnosis not present

## 2015-12-13 DIAGNOSIS — E039 Hypothyroidism, unspecified: Secondary | ICD-10-CM | POA: Diagnosis not present

## 2015-12-26 ENCOUNTER — Other Ambulatory Visit: Payer: Self-pay | Admitting: Family Medicine

## 2015-12-26 NOTE — Telephone Encounter (Signed)
Last labs reviewed; Rx approved 

## 2016-01-03 ENCOUNTER — Ambulatory Visit (INDEPENDENT_AMBULATORY_CARE_PROVIDER_SITE_OTHER): Payer: Medicare Other | Admitting: General Surgery

## 2016-01-03 ENCOUNTER — Encounter: Payer: Self-pay | Admitting: General Surgery

## 2016-01-03 VITALS — BP 138/82 | HR 76 | Resp 14 | Ht 63.0 in | Wt 171.0 lb

## 2016-01-03 DIAGNOSIS — K439 Ventral hernia without obstruction or gangrene: Secondary | ICD-10-CM | POA: Insufficient documentation

## 2016-01-03 NOTE — Patient Instructions (Addendum)
Ventral Hernia A ventral hernia (also called an incisional hernia) is a hernia that occurs at the site of a previous surgical cut (incision) in the abdomen. The abdominal wall spans from your lower chest down to your pelvis. If the abdominal wall is weakened from a surgical incision, a hernia can occur. A hernia is a bulge of bowel or muscle tissue pushing out on the weakened part of the abdominal wall. Ventral hernias can get bigger from straining or lifting. Obese and older people are at higher risk for a ventral hernia. People who develop infections after surgery or require repeat incisions at the same site on the abdomen are also at increased risk. CAUSES  A ventral hernia occurs because of weakness in the abdominal wall at an incision site.  SYMPTOMS  Common symptoms include:  A visible bulge or lump on the abdominal wall.  Pain or tenderness around the lump.  Increased discomfort if you cough or make a sudden movement. If the hernia has blocked part of the intestine, a serious complication can occur (incarcerated or strangulated hernia). This can become a problem that requires emergency surgery because the blood flow to the blocked intestine may be cut off. Symptoms may include:  Feeling sick to your stomach (nauseous).  Throwing up (vomiting).  Stomach swelling (distention) or bloating.  Fever.  Rapid heartbeat. DIAGNOSIS  Your health care provider will take a medical history and perform a physical exam. Various tests may be ordered, such as:  Blood tests.  Urine tests.  Ultrasonography.  X-rays.  Computed tomography (CT). TREATMENT  Watchful waiting may be all that is needed for a smaller hernia that does not cause symptoms. Your health care provider may recommend the use of a supportive belt (truss) that helps to keep the abdominal wall intact. For larger hernias or those that cause pain, surgery to repair the hernia is usually recommended. If a hernia becomes  strangulated, emergency surgery needs to be done right away. HOME CARE INSTRUCTIONS  Avoid putting pressure or strain on the abdominal area.  Avoid heavy lifting.  Use good body positioning for physical tasks. Ask your health care provider about proper body positioning.  Use a supportive belt as directed by your health care provider.  Maintain a healthy weight.  Eat foods that are high in fiber, such as whole grains, fruits, and vegetables. Fiber helps prevent difficult bowel movements (constipation).  Drink enough fluids to keep your urine clear or pale yellow.  Follow up with your health care provider as directed. SEEK MEDICAL CARE IF:   Your hernia seems to be getting larger or more painful. SEEK IMMEDIATE MEDICAL CARE IF:   You have abdominal pain that is sudden and sharp.  Your pain becomes severe.  You have repeated vomiting.  You are sweating a lot.  You notice a rapid heartbeat.  You develop a fever. MAKE SURE YOU:   Understand these instructions.  Will watch your condition.  Will get help right away if you are not doing well or get worse.   This information is not intended to replace advice given to you by your health care provider. Make sure you discuss any questions you have with your health care provider.   Document Released: 11/05/2012 Document Revised: 12/10/2014 Document Reviewed: 11/05/2012 Elsevier Interactive Patient Education 2016 Elsevier Inc.  

## 2016-01-03 NOTE — Progress Notes (Signed)
Patient ID: Janice [name], female   DOB: 1941/11/11, 75 y.o.   MRN: [id number]  Chief Complaint  Patient presents with  . Other    ventral hernia    HPI Janice [name] is a 75 y.o. female here today for a evaluation of a ventral hernia . Patient noticed this area about five years ago. 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 The area has gradually been getting larger over time. She states she was admitted to the hospital while visiting her sister in [place] three weeks ago for a bowel obstruction. This resolved with NG decompression. Patient was having abdominal pain in lower middle quadrant at the time of her hospitalization. Since her return back to [place], she's been eating a fairly bland diet and had no further abdominal pain. She reports regular bowel movements. A CT scan was completed during her hospitalization. A copy was not provided to the patient but has been requested.  I first interviewed the patient's history. HPI  Past Medical History  Diagnosis Date  . Hyperlipidemia   . Hypertension   . Subclavian arterial stenosis (HCC)   . Thyroid disease   . Hernia, ventral   . Chronic kidney disease     Past Surgical History  Procedure Laterality Date  . Cholecystectomy    . Artery bypass rechanneling  2010    Family History  Problem Relation Age of Onset  . Heart disease Mother   . Hypertension Mother   . Heart disease Father   . Hypertension Father   . Hypertension Sister   . Diabetes Sister   . Cancer Neg Hx   . COPD Neg Hx   . Stroke Neg Hx     Social History Social History  Substance Use Topics  . Smoking status: Former Smoker    Quit date: 12/03/2008  . Smokeless tobacco: Never Used  . Alcohol Use: No    No Known Allergies  Current Outpatient Prescriptions  Medication Sig Dispense Refill  . amLODipine (NORVASC) 10 MG tablet Take 10 mg by mouth daily.  6  . aspirin 81 MG tablet Take 81 mg by mouth daily.    .  Cholecalciferol (VITAMIN D3) 1000 UNITS CAPS Take 1,000 Int'l Units by mouth daily.    Marland Kitchen levothyroxine (SYNTHROID, LEVOTHROID) 112 MCG tablet Take 112 mcg by mouth daily.  3  . losartan (COZAAR) 50 MG tablet Take 50 mg by mouth daily.  3  . metoprolol-hydrochlorothiazide (LOPRESSOR HCT) 100-25 MG tablet TAKE 1/2 TABLET BY MOUTH TWICE A DAY 30 tablet 2  . rosuvastatin (CRESTOR) 10 MG tablet TAKE 1 TABLET (10 MG TOTAL) BY MOUTH AT BEDTIME. 30 tablet 6   No current facility-administered medications for this visit.    Review of Systems Review of Systems  Constitutional: Negative.  Negative for unexpected weight change.  HENT: Negative.   Eyes: Negative.   Respiratory: Negative.   Cardiovascular: Negative.   Endocrine: Negative.   Genitourinary: Negative.   Allergic/Immunologic: Negative.   Neurological: Negative.   Hematological: Negative.   Psychiatric/Behavioral: Negative.     Blood pressure 138/82, pulse 76, resp. rate 14, height  (1.6 m), weight 171 lb (77.565 kg).  Physical Exam Physical Exam  Constitutional: She is oriented to person, place, and time. She appears well-developed and well-nourished.  Eyes: Conjunctivae are normal. No scleral icterus.  Neck: Trachea normal. Neck supple. No thyromegaly present.  Cardiovascular: Normal rate and regular rhythm.   Murmur heard.  Systolic murmur is  present with a grade of 1/6  Pulses:      Carotid pulses are 2+ on the right side, and 2+ on the left side.      Femoral pulses are 2+ on the right side, and 2+ on the left side. Bilateral subclavian bruits are noted, left far more pronounced than the right.  No peripheral edema is noted.  Pulmonary/Chest: Effort normal and breath sounds normal.  Abdominal: Soft. Normal appearance and bowel sounds are normal. There is no tenderness. A hernia is present. Hernia confirmed positive in the ventral area ( right of midline right of the umbilicus 3mm and upper midline 3cm  ).    Mild  rash under belly  Lymphadenopathy:    She has no cervical adenopathy.       Right: No inguinal adenopathy present.       Left: No inguinal adenopathy present.  Neurological: She is alert and oriented to person, place, and time. She has normal strength. No cranial nerve deficit.  Skin: Skin is warm and dry.    Data Reviewed No imaging studies available for review.  Assessment    Ventral hernia, recent episode of intestinal obstruction which resolved with conservative treatment.    Plan    The patient will benefit from elective hernia repair. We'll request a copy of her CT scan from [place] be sent via [employer] for review. Once this exam is reviewed, a determination can be made whether she would be best served by open or laparoscopic repair. Pros and cons of each were briefly reviewed.     Patient to be contacted to schedule ventral hernia repair after records have been reviewed.  Hernia precautions and incarceration were discussed with the patient. If they develop symptoms of an incarcerated hernia, they were encouraged to seek prompt medical attention.  I have recommended repair of the hernia using mesh on an outpatient basis in the near future. The risk of infection was reviewed. The role of prosthetic mesh to minimize the risk of recurrence was reviewed.   PCP:  [place],  This information has been scribed by Ples Specter CMA.  Hernia precautions and incarceration were discussed with the patient. If they develop symptoms of an incarcerated hernia, they were encouraged to seek prompt medical attention.  I have recommended repair of the hernia using mesh on an outpatient basis in the near future. The risk of infection was reviewed. The role of prosthetic mesh to minimize the risk of recurrence was reviewed.  [name] 01/03/2016, 9:22 PM

## 2016-01-06 ENCOUNTER — Encounter: Payer: Self-pay | Admitting: General Surgery

## 2016-01-06 NOTE — Progress Notes (Signed)
CT scan from [facility] dated 12/13/2015 was reviewed. Multiple defects, the largest 6 cm in diameter. Transverse colon and small bowel contained in the larger defect with evidence of proximal small bowel dilatation.  The patient will be best served by open ventral hernia repair.

## 2016-01-17 ENCOUNTER — Ambulatory Visit (INDEPENDENT_AMBULATORY_CARE_PROVIDER_SITE_OTHER): Payer: Medicare Other | Admitting: General Surgery

## 2016-01-17 ENCOUNTER — Encounter
Admission: RE | Admit: 2016-01-17 | Discharge: 2016-01-17 | Disposition: A | Discharge status: Home / Self Care | Payer: Medicare Other | Source: Ambulatory Visit | Attending: General Surgery | Admitting: General Surgery

## 2016-01-17 ENCOUNTER — Encounter: Payer: Self-pay | Admitting: General Surgery

## 2016-01-17 VITALS — BP 136/74 | HR 74 | Resp 14 | Ht 63.0 in | Wt 173.0 lb

## 2016-01-17 DIAGNOSIS — Z01810 Encounter for preprocedural cardiovascular examination: Secondary | ICD-10-CM | POA: Diagnosis not present

## 2016-01-17 DIAGNOSIS — K439 Ventral hernia without obstruction or gangrene: Secondary | ICD-10-CM

## 2016-01-17 DIAGNOSIS — I10 Essential (primary) hypertension: Secondary | ICD-10-CM | POA: Diagnosis not present

## 2016-01-17 DIAGNOSIS — Z01812 Encounter for preprocedural laboratory examination: Secondary | ICD-10-CM | POA: Insufficient documentation

## 2016-01-17 LAB — BASIC METABOLIC PANEL
ANION GAP: 6 (ref 5–15)
BUN: 22 mg/dL — ABNORMAL HIGH (ref 6–20)
CO2: 29 mmol/L (ref 22–32)
Calcium: 9.7 mg/dL (ref 8.9–10.3)
Chloride: 105 mmol/L (ref 101–111)
Creatinine, Ser: 1.07 mg/dL — ABNORMAL HIGH (ref 0.44–1.00)
GFR calc Af Amer: 57 mL/min — ABNORMAL LOW (ref >60)
GFR, EST NON AFRICAN AMERICAN: 49 mL/min — AB (ref >60)
GLUCOSE: 100 mg/dL — AB (ref 65–99)
POTASSIUM: 4 mmol/L (ref 3.5–5.1)
Sodium: 140 mmol/L (ref 135–145)

## 2016-01-17 LAB — CBC WITH DIFFERENTIAL/PLATELET
BASOS ABS: 0.1 10*3/uL (ref 0–0.1)
Basophils Relative: 1 %
Eosinophils Absolute: 0.1 10*3/uL (ref 0–0.7)
Eosinophils Relative: 1 %
HEMATOCRIT: 36 % (ref 35.0–47.0)
HEMOGLOBIN: 11.9 g/dL — AB (ref 12.0–16.0)
LYMPHS PCT: 21 %
Lymphs Abs: 1.4 10*3/uL (ref 1.0–3.6)
MCH: 29 pg (ref 26.0–34.0)
MCHC: 33.1 g/dL (ref 32.0–36.0)
MCV: 87.6 fL (ref 80.0–100.0)
MONO ABS: 0.5 10*3/uL (ref 0.2–0.9)
Monocytes Relative: 7 %
NEUTROS ABS: 4.7 10*3/uL (ref 1.4–6.5)
Neutrophils Relative %: 70 %
Platelets: 213 10*3/uL (ref 150–440)
RBC: 4.11 MIL/uL (ref 3.80–5.20)
RDW: 14.4 % (ref 11.5–14.5)
WBC: 6.8 10*3/uL (ref 3.6–11.0)

## 2016-01-17 NOTE — Patient Instructions (Signed)
Ventral hernia scheduled for 01/24/16.

## 2016-01-17 NOTE — Pre-Procedure Instructions (Signed)
AS INSTRUCTED BY DR VANSTAVEREN, REQUEST FOR CLEARANCE RE EKG CALLED AND FAXED TO KRISTEN AT DR LADA OFFICE. ALSO CALLED AND FAXED TO EMILY AT DR Lemar Livings

## 2016-01-17 NOTE — Patient Instructions (Signed)
  Your procedure is scheduled on: January 24, 2016 (Tuesday) Report to Day Surgery.[facility]) Second Floor To find out your arrival time please call [phone] between 1PM - 3PM on January 23, 2016 (Monday).  Remember: Instructions that are not followed completely may result in serious medical risk, up to and including death, or upon the discretion of your surgeon and anesthesiologist your surgery may need to be rescheduled.    __x__ 1. Do not eat food or drink liquids after midnight. No gum chewing or hard candies.     __x__ 2. No Alcohol for 24 hours before or after surgery.   ____ 3. Bring all medications with you on the day of surgery if instructed.    __x__ 4. Notify your doctor if there is any change in your medical condition     (cold, fever, infections).     Do not wear jewelry, make-up, hairpins, clips or nail polish.  Do not wear lotions, powders, or perfumes. You may wear deodorant.  Do not shave 48 hours prior to surgery. Men may shave face and neck.  Do not bring valuables to the hospital.    [facility] is not responsible for any belongings or valuables.               Contacts, dentures or bridgework may not be worn into surgery.  Leave your suitcase in the car. After surgery it may be brought to your room.  For patients admitted to the hospital, discharge time is determined by your                treatment team.   Patients discharged the day of surgery will not be allowed to drive home.   Please read over the following fact sheets that you were given:   Surgical Site Infection Prevention   __x__ Take these medicines the morning of surgery with A SIP OF WATER:    1. Amlodipine   2. Losartan        ____ Fleet Enema (as directed)   __x__ Use CHG Soap as directed  ____ Use inhalers on the day of surgery  ____ Stop metformin 2 days prior to surgery    ____ Take 1/2 of usual insulin dose the night before surgery and none on the morning of surgery.    __x__ Stop Coumadin/Plavix/aspirin on (DO NOT TAKE THE ASPIRIN THE MORNING OF SURGERY)  ____ Stop Anti-inflammatories on (NO NSAIDS) TYLENOL OK TO TAKE FOR PAIN IF NEEDED)   ____ Stop supplements until after surgery.    ____ Bring C-Pap to the hospital.

## 2016-01-17 NOTE — H&P (Signed)
HPI  Janice [name] is a 75 y.o. female here today for her pre op ventral hernia repair scheduled for 01/24/16.  I personally reviewed the patient's history.  HPI  Past Medical History   Diagnosis  Date   .  Hyperlipidemia    .  Hypertension    .  Subclavian arterial stenosis (HCC)    .  Thyroid disease    .  Hernia, ventral    .  Chronic kidney disease     Past Surgical History   Procedure  Laterality  Date   .  Cholecystectomy     .  Artery bypass rechanneling   2010    Family History   Problem  Relation  Age of Onset   .  Heart disease  Mother    .  Hypertension  Mother    .  Heart disease  Father    .  Hypertension  Father    .  Hypertension  Sister    .  Diabetes  Sister    .  Cancer  Neg Hx    .  COPD  Neg Hx    .  Stroke  Neg Hx     Social History  Social History   Substance Use Topics   .  Smoking status:  Former Smoker     Quit date:  12/03/2008   .  Smokeless tobacco:  Never Used   .  Alcohol Use:  No    No Known Allergies  Current Outpatient Prescriptions   Medication  Sig  Dispense  Refill   .  amLODipine (NORVASC) 10 MG tablet  Take 10 mg by mouth daily.   6   .  aspirin 81 MG tablet  Take 81 mg by mouth daily.     .  Cholecalciferol (VITAMIN D3) 1000 UNITS CAPS  Take 1,000 Int'l Units by mouth daily.     Marland Kitchen  levothyroxine (SYNTHROID, LEVOTHROID) 112 MCG tablet  Take 112 mcg by mouth daily.   3   .  losartan (COZAAR) 50 MG tablet  Take 50 mg by mouth daily.   3   .  metoprolol-hydrochlorothiazide (LOPRESSOR HCT) 100-25 MG tablet  TAKE 1/2 TABLET BY MOUTH TWICE A DAY  30 tablet  2   .  rosuvastatin (CRESTOR) 10 MG tablet  TAKE 1 TABLET (10 MG TOTAL) BY MOUTH AT BEDTIME.  30 tablet  6    No current facility-administered medications for this visit.    Review of Systems  Review of Systems  Constitutional: Negative.  Respiratory: Negative.  Cardiovascular: Negative.   Blood pressure 136/74, pulse 74, resp. rate 14, height  (1.6 m), weight 173 lb  (78.472 kg).  Physical Exam  Physical Exam  Constitutional: She is oriented to person, place, and time. She appears well-developed and well-nourished.  Eyes: Conjunctivae are normal. No scleral icterus.  Neck: Neck supple.  Cardiovascular: Normal rate, regular rhythm and normal heart sounds.  Pulmonary/Chest: Effort normal and breath sounds normal.  Abdominal: Soft. Normal appearance and bowel sounds are normal. There is no tenderness. A hernia is present. Hernia confirmed positive in the ventral area ( right of midline right of the umbilicus 3mm and upper midline 3cm ).  Lymphadenopathy:  She has no cervical adenopathy.  Neurological: She is alert and oriented to person, place, and time.  Skin: Skin is warm and dry.   Data Reviewed  CT from [place], [place] from January 2017 has been reviewed showing 2 fascial  defects, the largest 6 cm in size including both the transverse colon and the small intestine.  Assessment   Ventral hernia with previous episode of obstruction.  Previous aortic bypass.  Obesity.  History hypothyroidism.   Plan   The indications for open ventral hernia repair were reviewed. Considering the size of the fascial defects I think she would be best managed with a retro-rectus mesh repair. Pros and cons of the use of prosthetic mesh were discussed. Considering her size and the defects, I think it is unlikely that a primary repair will hold.  The importance of early ambulation after surgery were reviewed in detail. She reports that she had good pain control with IV narcotics after her aortic surgery and did not require an epidural.   I have recommended repair of the hernia using mesh on an outpatient basis in the near future. The risk of infection was reviewed. The role of prosthetic mesh to minimize the risk of recurrence was reviewed.  PCP: Kerman Passey  This information has been scribed by Dorathy Daft RNBC.  [name]  01/17/2016, 2:23 PM

## 2016-01-17 NOTE — Progress Notes (Signed)
Patient ID: Janice [name], female   DOB: Mar 26, 1941, 75 y.o.   MRN: [id number]  Chief Complaint  Patient presents with  . Pre-op Exam    hernia    HPI Janice [name] is a 75 y.o. female here today for her pre op ventral hernia repair scheduled for 01/24/16.    I personally reviewed the patient's history. HPI  Past Medical History  Diagnosis Date  . Hyperlipidemia   . Hypertension   . Subclavian arterial stenosis (HCC)   . Thyroid disease   . Hernia, ventral   . Chronic kidney disease     Past Surgical History  Procedure Laterality Date  . Cholecystectomy    . Artery bypass rechanneling  2010    Family History  Problem Relation Age of Onset  . Heart disease Mother   . Hypertension Mother   . Heart disease Father   . Hypertension Father   . Hypertension Sister   . Diabetes Sister   . Cancer Neg Hx   . COPD Neg Hx   . Stroke Neg Hx     Social History Social History  Substance Use Topics  . Smoking status: Former Smoker    Quit date: 12/03/2008  . Smokeless tobacco: Never Used  . Alcohol Use: No    No Known Allergies  Current Outpatient Prescriptions  Medication Sig Dispense Refill  . amLODipine (NORVASC) 10 MG tablet Take 10 mg by mouth daily.  6  . aspirin 81 MG tablet Take 81 mg by mouth daily.    . Cholecalciferol (VITAMIN D3) 1000 UNITS CAPS Take 1,000 Int'l Units by mouth daily.    Marland Kitchen levothyroxine (SYNTHROID, LEVOTHROID) 112 MCG tablet Take 112 mcg by mouth daily.  3  . losartan (COZAAR) 50 MG tablet Take 50 mg by mouth daily.  3  . metoprolol-hydrochlorothiazide (LOPRESSOR HCT) 100-25 MG tablet TAKE 1/2 TABLET BY MOUTH TWICE A DAY 30 tablet 2  . rosuvastatin (CRESTOR) 10 MG tablet TAKE 1 TABLET (10 MG TOTAL) BY MOUTH AT BEDTIME. 30 tablet 6   No current facility-administered medications for this visit.    Review of Systems Review of Systems  Constitutional: Negative.   Respiratory: Negative.   Cardiovascular: Negative.     Blood pressure  136/74, pulse 74, resp. rate 14, height  (1.6 m), weight 173 lb (78.472 kg).  Physical Exam Physical Exam  Constitutional: She is oriented to person, place, and time. She appears well-developed and well-nourished.  Eyes: Conjunctivae are normal. No scleral icterus.  Neck: Neck supple.  Cardiovascular: Normal rate, regular rhythm and normal heart sounds.   Pulmonary/Chest: Effort normal and breath sounds normal.  Abdominal: Soft. Normal appearance and bowel sounds are normal. There is no tenderness. A hernia is present. Hernia confirmed positive in the ventral area ( right of midline right of the umbilicus 3mm and upper midline 3cm).  Lymphadenopathy:    She has no cervical adenopathy.  Neurological: She is alert and oriented to person, place, and time.  Skin: Skin is warm and dry.    Data Reviewed CT from [place], [place] from January 2017 has been reviewed showing 2 fascial defects, the largest 6 cm in size including both the transverse colon and the small intestine.  Assessment    Ventral hernia with previous episode of obstruction.  Previous aortic bypass.  Obesity.  History hypothyroidism.    Plan    The indications for open ventral hernia repair were reviewed. Considering the size of the fascial defects  I think she would be best managed with a retro-rectus mesh repair. Pros and cons of the use of prosthetic mesh were discussed. Considering her size and the defects, I think it is unlikely that a primary repair will hold.  The importance of early ambulation after surgery were reviewed in detail. She reports that she had good pain control with IV narcotics after her aortic surgery and did not require an epidural.     I have recommended repair of the hernia using mesh on an outpatient basis in the near future. The risk of infection was reviewed. The role of prosthetic mesh to minimize the risk of recurrence was reviewed.  PCP:  Kerman Passey  This information has been  scribed by Dorathy Daft RNBC.    [name] 01/17/2016, 2:23 PM

## 2016-01-18 ENCOUNTER — Telehealth: Payer: Self-pay | Admitting: *Deleted

## 2016-01-18 NOTE — Telephone Encounter (Signed)
-----   Message from Erich Montane, CMA sent at 01/17/2016  4:37 PM EST ----- Cordelia Pen from [facility] called 01/17/16 and wanted to let you know that they faxed Bonita Quin Tolson's EKG to her primary care doctor (Dr.Lada)

## 2016-01-18 NOTE — Telephone Encounter (Signed)
Message left for Amy, CMA at Dr. Marlise Eves office to see if they received EKG from Pre-admit Testing.   We need to find out if Dr. Sherie Don will medically clear patient based upon review of EKG or if she will need to be seen prior to ventral hernia repair which is scheduled for Tuesday, 01-24-16.

## 2016-01-19 NOTE — Telephone Encounter (Signed)
Another call was made to Dr. Marlise Eves office today. Per receptionist, Amy has been out all week.   They did receive EKG from pre-admit testing and are waiting on Dr. Sherie Don to review.

## 2016-01-23 ENCOUNTER — Telehealth: Payer: Self-pay | Admitting: Family Medicine

## 2016-01-23 ENCOUNTER — Telehealth: Payer: Self-pay

## 2016-01-23 ENCOUNTER — Ambulatory Visit (INDEPENDENT_AMBULATORY_CARE_PROVIDER_SITE_OTHER): Payer: Medicare Other | Admitting: Family Medicine

## 2016-01-23 ENCOUNTER — Encounter: Payer: Self-pay | Admitting: Family Medicine

## 2016-01-23 VITALS — BP 166/83 | HR 66 | Temp 98.0°F | Ht 62.0 in | Wt 171.6 lb

## 2016-01-23 DIAGNOSIS — I708 Atherosclerosis of other arteries: Secondary | ICD-10-CM

## 2016-01-23 DIAGNOSIS — K439 Ventral hernia without obstruction or gangrene: Secondary | ICD-10-CM | POA: Diagnosis not present

## 2016-01-23 DIAGNOSIS — I447 Left bundle-branch block, unspecified: Secondary | ICD-10-CM | POA: Insufficient documentation

## 2016-01-23 DIAGNOSIS — I7090 Unspecified atherosclerosis: Secondary | ICD-10-CM

## 2016-01-23 NOTE — Assessment & Plan Note (Signed)
Refer to cardiology for cardiac clearance

## 2016-01-23 NOTE — Telephone Encounter (Signed)
Let's get her in to see CARDIOLOGY today for clearance; I'm not going to be able to see give clearance if she has a new BBB Referral entered

## 2016-01-23 NOTE — Telephone Encounter (Signed)
Toniann Fail called from Dr Rutherford Nail office in need of surgical clearance for the pt. Surgery date is tomorrow.

## 2016-01-23 NOTE — Telephone Encounter (Signed)
Dr. Sherie Don called the office today and spoke with Dr. Lemar Livings. Patient will need cardiac clearance prior to ventral hernia repair.   Surgery has been cancelled for tomorrow. Spoke with Leah in the O.R.  Dr. Marlise Eves office to arrange cardiac clearance and contact the patient with the appointment.   Patient's daughter notified of this and she verbalizes understanding.

## 2016-01-23 NOTE — Progress Notes (Signed)
BP 166/83 mmHg  Pulse 66  Temp(Src) 98 F (36.7 C)  Ht  (1.575 m)  Wt 171 lb 9.6 oz (77.837 kg)  BMI 31.38 kg/m2  SpO2 100%   Subjective:    Patient ID: Janice [name], female    DOB: Mar 21, 1941, 75 y.o.   MRN: [id number]  HPI: Janice [name] is a 74 y.o. female  Chief Complaint  Patient presents with  . Surgical Clearance   Patient comes in today for an urgent visit, needing to get surgical clearance for tomorrow and she says I have ten minutes to call the doctor to let her keep her surgery time for tomorrow She was found to have a new LBBB on her EKG at pre-op at the hospital and needs me to say it's okay for her to have major abdominal surgery tomorrow (hernia repair) No chest pain No trouble breathing No fam hx of heart disease  Used to smoke, quit 7 years ago; smoked 1 ppd for 20 years Last lipids were good  Hospitalized with bowel obstruction, Jan 10th, NG decompression, 3 days Eating well, good appetite No dysuria No boils or abscesses No fevers She does bruise easily  BP runs 137-139 at home  Relevant past medical, surgical, family and social history reviewed and updated as indicated. Interim medical history since our last visit reviewed. Past Medical History  Diagnosis Date  . Hyperlipidemia   . Hypertension   . Subclavian arterial stenosis (HCC)   . Thyroid disease   . Hernia, ventral   . Chronic kidney disease    Past Surgical History  Procedure Laterality Date  . Cholecystectomy    . Artery bypass rechanneling  2010   Family History  Problem Relation Age of Onset  . Heart disease Mother   . Hypertension Mother   . Heart disease Father   . Hypertension Father   . Hypertension Sister   . Diabetes Sister   . Cancer Neg Hx   . COPD Neg Hx   . Stroke Neg Hx    Social History  Substance Use Topics  . Smoking status: Former Smoker    Types: Cigarettes    Quit date: 12/03/2008  . Smokeless tobacco: Never Used  . Alcohol Use: No    Allergies and medications reviewed and updated.  Review of Systems Per HPI unless specifically indicated above     Objective:    BP 166/83 mmHg  Pulse 66  Temp(Src) 98 F (36.7 C)  Ht  (1.575 m)  Wt 171 lb 9.6 oz (77.837 kg)  BMI 31.38 kg/m2  SpO2 100%  Wt Readings from Last 3 Encounters:  01/23/16 171 lb 9.6 oz (77.837 kg)  01/17/16 171 lb (77.565 kg)  01/17/16 173 lb (78.472 kg)    Physical Exam  Constitutional: She appears well-developed and well-nourished.  Obese female  Eyes: No scleral icterus.  Neck: No JVD present.  Cardiovascular: Normal rate and regular rhythm.   Pulmonary/Chest: Effort normal and breath sounds normal.  Musculoskeletal: She exhibits no edema.  Neurological: She is alert.  Psychiatric: She has a normal mood and affect.      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 Lemar Livings         Follow up plan: No Follow-up on file.  REFERRAL entered earlier in the day, not at the time of my visit:          Type Date User   Provider Comments 01/23/2016 11:12 AM Janice [name], [facility]         Summary   Provider Comments        Note   Patient has LBBB on EKG, needs surgical clearance; surgery is tomorrow      An after-visit summary was printed and given to the patient at check-out.  Please see the patient instructions which may contain other information and recommendations beyond what is mentioned above in the assessment and plan.

## 2016-01-23 NOTE — Telephone Encounter (Signed)
Called [facility] to get records for patient including her discharge summary. There was no answer in medical records so I asked to please return my phone call.

## 2016-01-23 NOTE — Telephone Encounter (Signed)
Kristin to chBelenda Cruisewith Dr. Sherie Don and see if patient can come in today for medical clearance. She will call the office back once she speaks with Dr. Sherie Don to see when they can get patient in for an appointment.

## 2016-01-23 NOTE — Patient Instructions (Signed)
If you develop abdominal pain, bloating, nausea, etc, go immediately to the ER We'll have you see the cardiologist as soon as possible Your goal blood pressure is less than 140 mmHg on top. Try to follow the DASH guidelines (DASH stands for Dietary Approaches to Stop Hypertension) Try to limit the sodium in your diet.  Ideally, consume less than 1.5 grams (less than 1,500mg ) per day. Do not add salt when cooking or at the table.  Check the sodium amount on labels when shopping, and choose items lower in sodium when given a choice. Avoid or limit foods that already contain a lot of sodium. Eat a diet rich in fruits and vegetables and whole grains.

## 2016-01-23 NOTE — Assessment & Plan Note (Signed)
Refer to cards for cardiac clearance

## 2016-01-23 NOTE — Telephone Encounter (Signed)
Called and notified Michele(not Toniann Fail) that I had spoke with the pt and was waiting on a call back to see if she could make it in today.

## 2016-01-24 ENCOUNTER — Encounter: Admission: RE | Payer: Self-pay | Source: Ambulatory Visit

## 2016-01-24 ENCOUNTER — Ambulatory Visit: Admission: RE | Admit: 2016-01-24 | Payer: Medicare Other | Source: Ambulatory Visit | Admitting: General Surgery

## 2016-01-24 SURGERY — REPAIR, HERNIA, VENTRAL
Anesthesia: Choice

## 2016-01-27 NOTE — Telephone Encounter (Signed)
Called [facility] and Medical Records said to fax over them a sheet with our practice's cover head (like a fax cover sheet) with the patient's name and date of birth. They would then fax [place] the records.  Request faxed to [phone].

## 2016-01-29 ENCOUNTER — Telehealth: Payer: Self-pay | Admitting: Family Medicine

## 2016-01-29 DIAGNOSIS — I447 Left bundle-branch block, unspecified: Secondary | ICD-10-CM

## 2016-01-29 NOTE — Assessment & Plan Note (Signed)
Needs referral to cardiologist for surgical clearance ASAP; surgery last week was put on hold until cardiac clearance can be obtained

## 2016-01-29 NOTE — Telephone Encounter (Signed)
KATINA -- I entered a cardiology referral for a new LBBB on Feb 20th I see now that it has been cancelled, but I have NO idea why someone would cancel this referral Patient needs to see the cardiologist I did not authorize this referral to be cancelled She needs to see cardiologist to get clearance so her surgery can go forward Please get this referral done ASAP; Dr. Lemar Livings and the patient are waiting on this I am out of the office Monday and Tuesday (Feb 27th and 28th) Please make sure that this gets done I don't know what happened, but I will enter a NEW cardiology referral so there is no confusion

## 2016-01-29 NOTE — Assessment & Plan Note (Signed)
New findings on EKG; I epxlained dx, that I cannot give her surgical clearance in such a rushed fashion to proceed with surgery like this; I called her surgeon and we discussed her case; he agrees with cardiology appt for clearance prior to surgery

## 2016-01-29 NOTE — Assessment & Plan Note (Signed)
She already has known atherosclerosis, and I explained that a new LBBB could be indicated for blockages to the arteries in her heart; she needs to see a cardiologist

## 2016-01-29 NOTE — Assessment & Plan Note (Signed)
I spoke with her surgeon about the situation; he does not feel her surgery is emergent; she will go to the ER if s/s of obstruction, strangulation, incarceration; in the meantime, she'll see cardiologist for clearance and then have repair with Dr. Lemar Livings

## 2016-02-01 ENCOUNTER — Encounter: Payer: Self-pay | Admitting: Cardiology

## 2016-02-01 ENCOUNTER — Encounter (INDEPENDENT_AMBULATORY_CARE_PROVIDER_SITE_OTHER): Payer: Self-pay

## 2016-02-01 ENCOUNTER — Ambulatory Visit (INDEPENDENT_AMBULATORY_CARE_PROVIDER_SITE_OTHER): Payer: Medicare Other | Admitting: Cardiology

## 2016-02-01 VITALS — BP 152/80 | HR 55 | Ht 62.0 in | Wt 172.2 lb

## 2016-02-01 DIAGNOSIS — E785 Hyperlipidemia, unspecified: Secondary | ICD-10-CM | POA: Diagnosis not present

## 2016-02-01 DIAGNOSIS — E782 Mixed hyperlipidemia: Secondary | ICD-10-CM | POA: Insufficient documentation

## 2016-02-01 DIAGNOSIS — R9431 Abnormal electrocardiogram [ECG] [EKG]: Secondary | ICD-10-CM

## 2016-02-01 DIAGNOSIS — Z01818 Encounter for other preprocedural examination: Secondary | ICD-10-CM

## 2016-02-01 DIAGNOSIS — E669 Obesity, unspecified: Secondary | ICD-10-CM

## 2016-02-01 DIAGNOSIS — I10 Essential (primary) hypertension: Secondary | ICD-10-CM | POA: Diagnosis not present

## 2016-02-01 DIAGNOSIS — I447 Left bundle-branch block, unspecified: Secondary | ICD-10-CM | POA: Diagnosis not present

## 2016-02-01 DIAGNOSIS — I739 Peripheral vascular disease, unspecified: Secondary | ICD-10-CM | POA: Insufficient documentation

## 2016-02-01 NOTE — Patient Instructions (Addendum)
Medication Instructions:  Your physician recommends that you continue on your current medications as directed. Please refer to the Current Medication list given to you today.   Labwork: None Ordered  Testing/Procedures: Your physician has requested that you have a lexiscan myoview. For further information please visit [email or web address] Please follow instruction sheet, as given.  Date & Time: _March 8, 2017 @ 07:30 AM___   Your physician has requested that you have an echocardiogram. Echocardiography is a painless test that uses sound waves to create images of your heart. It provides your doctor with information about the size and shape of your heart and how well your heart's chambers and valves are working. This procedure takes approximately one hour. There are no restrictions for this procedure.  Date & Time: __________________________________________________  Follow-Up: Your physician recommends that you schedule a follow-up appointment after testing to review results.   Date & Time: ___________________________________________________   Any Other Special Instructions Will Be Listed Below (If Applicable).  Your physician has requested that you regularly monitor and record your blood pressure readings at home. Please use the same machine at the same time of day to check your readings and record them to bring to your follow-up visit.    [facility] MYOVIEW  Your caregiver has ordered a Stress Test with nuclear imaging. The purpose of this test is to evaluate the blood supply to your heart muscle. This procedure is referred to as a "Non-Invasive Stress Test." This is because other than having an IV started in your vein, nothing is inserted or "invades" your body. Cardiac stress tests are done to find areas of poor blood flow to the heart by determining the extent of coronary artery disease (CAD). Some patients exercise on a treadmill, which naturally increases the blood flow to your heart,  while others who are  unable to walk on a treadmill due to physical limitations have a pharmacologic/chemical stress agent called Lexiscan . This medicine will mimic walking on a treadmill by temporarily increasing your coronary blood flow.   Please note: these test may take anywhere between 2-4 hours to complete  PLEASE REPORT TO [facility] MEDICAL MALL ENTRANCE  THE VOLUNTEERS AT THE FIRST DESK WILL DIRECT YOU WHERE TO GO  Date of Procedure:_____March 8, 2017__________  Arrival Time for Procedure:____07:15 AM_______________  Instructions regarding medication:   _X___:  Hold betablocker(s) night before procedure and morning of procedure Lopressor HCT/Metoprolol   PLEASE NOTIFY THE OFFICE AT LEAST 24 HOURS IN ADVANCE IF YOU ARE UNABLE TO KEEP YOUR APPOINTMENT.  [phone] AND  PLEASE NOTIFY NUCLEAR MEDICINE AT [facility] AT LEAST 24 HOURS IN ADVANCE IF YOU ARE UNABLE TO KEEP YOUR APPOINTMENT. [phone]  How to prepare for your Myoview test:   Do not eat or drink after midnight  No caffeine for 24 hours prior to test  No smoking 24 hours prior to test.  Your medication may be taken with water.  If your doctor stopped a medication because of this test, do not take that medication.  Ladies, please do not wear dresses.  Skirts or pants are appropriate. Please wear a short sleeve shirt.  No perfume, cologne or lotion.  Wear comfortable walking shoes. No heels!             If you need a refill on your cardiac medications before your next appointment, please call your pharmacy.  Pharmacologic Stress Electrocardiogram A pharmacologic stress electrocardiogram is a heart (cardiac) test that uses nuclear imaging to evaluate the blood supply to your  heart. This test may also be called a pharmacologic stress electrocardiography. Pharmacologic means that a medicine is used to increase your heart rate and blood pressure.  This stress test is done to find areas of poor blood flow to the  heart by determining the extent of coronary artery disease (CAD). Some people exercise on a treadmill, which naturally increases the blood flow to the heart. For those people unable to exercise on a treadmill, a medicine is used. This medicine stimulates your heart and will cause your heart to beat harder and more quickly, as if you were exercising.  Pharmacologic stress tests can help determine:  The adequacy of blood flow to your heart during increased levels of activity in order to clear you for discharge home.  The extent of coronary artery blockage caused by CAD.  Your prognosis if you have suffered a heart attack.  The effectiveness of cardiac procedures done, such as an angioplasty, which can increase the circulation in your coronary arteries.  Causes of chest pain or pressure. LET [facility] CARE PROVIDER KNOW ABOUT:  Any allergies you have.  All medicines you are taking, including vitamins, herbs, eye drops, creams, and over-the-counter medicines.  Previous problems you or members of your family have had with the use of anesthetics.  Any blood disorders you have.  Previous surgeries you have had.  Medical conditions you have.  Possibility of pregnancy, if this applies.  If you are currently breastfeeding. RISKS AND COMPLICATIONS Generally, this is a safe procedure. However, as with any procedure, complications can occur. Possible complications include:  You develop pain or pressure in the following areas:  Chest.  Jaw or neck.  Between your shoulder blades.  Radiating down your left arm.  Headache.  Dizziness or light-headedness.  Shortness of breath.  Increased or irregular heartbeat.  Low blood pressure.  Nausea or vomiting.  Flushing.  Redness going up the arm and slight pain during injection of medicine.  Heart attack (rare). BEFORE THE PROCEDURE   Avoid all forms of caffeine for 24 hours before your test or as directed by your health care  provider. This includes coffee, tea (even decaffeinated tea), caffeinated sodas, chocolate, cocoa, and certain pain medicines.  Follow your health care provider's instructions regarding eating and drinking before the test.  Take your medicines as directed at regular times with water unless instructed otherwise. Exceptions may include:  If you have diabetes, ask how you are to take your insulin or pills. It is common to adjust insulin dosing the morning of the test.  If you are taking beta-blocker medicines, it is important to talk to your health care provider about these medicines well before the date of your test. Taking beta-blocker medicines may interfere with the test. In some cases, these medicines need to be changed or stopped 24 hours or more before the test.  If you wear a nitroglycerin patch, it may need to be removed prior to the test. Ask your health care provider if the patch should be removed before the test.  If you use an inhaler for any breathing condition, bring it with you to the test.  If you are an outpatient, bring a snack so you can eat right after the stress phase of the test.  Do not smoke for 4 hours prior to the test or as directed by your health care provider.  Do not apply lotions, powders, creams, or oils on your chest prior to the test.  Wear comfortable shoes and clothing.  Let your health care provider know if you were unable to complete or follow the preparations for your test. PROCEDURE   Multiple patches (electrodes) will be put on your chest. If needed, small areas of your chest may be shaved to get better contact with the electrodes. Once the electrodes are attached to your body, multiple wires will be attached to the electrodes, and your heart rate will be monitored.  An IV access will be started. A nuclear trace (isotope) is given. The isotope may be given intravenously, or it may be swallowed. Nuclear refers to several types of radioactive isotopes,  and the nuclear isotope lights up the arteries so that the nuclear images are clear. The isotope is absorbed by your body. This results in low radiation exposure.  A resting nuclear image is taken to show how your heart functions at rest.  A medicine is given through the IV access.  A second scan is done about 1 hour after the medicine injection and determines how your heart functions under stress.  During this stress phase, you will be connected to an electrocardiogram machine. Your blood pressure and oxygen levels will be monitored. AFTER THE PROCEDURE   Your heart rate and blood pressure will be monitored after the test.  You may return to your normal schedule, including diet,activities, and medicines, unless your health care provider tells you otherwise.   This information is not intended to replace advice given to you by your health care provider. Make sure you discuss any questions you have with your health care provider.   Document Released: 04/07/2009 Document Revised: 11/24/2013 Document Reviewed: 07/27/2013 Elsevier Interactive Patient Education [employer]

## 2016-02-01 NOTE — Progress Notes (Signed)
Cardiology Office Note   Date:  02/01/2016   ID:  Janice [name], DOB [birth year], MRN [id number]  Referring Doctor:  Baruch Gouty, MD   Cardiologist:   Almond Lint, MD   Reason for consultation:  Chief Complaint  Patient presents with  . other    Pt needs cardiac clearance for hernia repair Dr. Birdie Sons ref pcp due to LBBB. Meds reviewed verbally with pt.      History of Present Illness: Janice [name] is a 75 y.o. female who presents for left bundle branch block on EKG noted by PCP.  Patient was being evaluated by PCP prior to ventral hernia repair. Left bundle-branch block was noted on EKG. Per patient, she does not have any chest pain, chest tightness, shortness of breath. She walks up and down flights stairs at home without any difficulty, no chest pain or shortness of breath with this. She is not aware of any abnormalities on her EKG prior to being seen by her PCP.  Patient denies loss of consciousness, cough, cold, abdominal pain, swelling, PND, orthopnea. She is reported to snore but does not complain of any sleep abnormalities. No excessive fatigue during the day.  Regarding her hypertension, she reports that her blood pressure normally is in the 130s to 140s systolic. She says that the nephrologist is the one managing her antihypertensive medication.   ROS:  Please see the history of present illness. Aside from mentioned under HPI, all other systems are reviewed and negative.     Past Medical History  Diagnosis Date  . Hyperlipidemia   . Hypertension   . Subclavian arterial stenosis (HCC)   . Thyroid disease   . Hernia, ventral   . Chronic kidney disease    Peripheral vascular disease  Past Surgical History  Procedure Laterality Date  . Cholecystectomy    . Artery bypass rechanneling  2010     reports that she quit smoking about 7 years ago. Her smoking use included Cigarettes. She has never used smokeless tobacco. She reports that she does not drink  alcohol or use illicit drugs.   family history includes Diabetes in her sister; Heart disease in her father and mother; Hypertension in her father, mother, and sister. There is no history of Cancer, COPD, or Stroke.   Current Outpatient Prescriptions  Medication Sig Dispense Refill  . amLODipine (NORVASC) 10 MG tablet Take 10 mg by mouth daily.  6  . aspirin 81 MG tablet Take 81 mg by mouth daily.    . Cholecalciferol (VITAMIN D3) 1000 UNITS CAPS Take 1,000 Int'l Units by mouth daily.    Marland Kitchen levothyroxine (SYNTHROID, LEVOTHROID) 112 MCG tablet Take 112 mcg by mouth daily.  3  . losartan (COZAAR) 50 MG tablet Take 50 mg by mouth daily.  3  . metoprolol-hydrochlorothiazide (LOPRESSOR HCT) 100-25 MG tablet TAKE 1/2 TABLET BY MOUTH TWICE A DAY 30 tablet 2  . rosuvastatin (CRESTOR) 10 MG tablet TAKE 1 TABLET (10 MG TOTAL) BY MOUTH AT BEDTIME. 30 tablet 6   No current facility-administered medications for this visit.    Allergies: Review of patient's allergies indicates no known allergies.    PHYSICAL EXAM: VS:  BP 152/80 mmHg  Pulse 55  Ht  (1.575 m)  Wt 172 lb 4 oz (78.132 kg)  BMI 31.50 kg/m2 , Body mass index is 31.5 kg/(m^2). Wt Readings from Last 3 Encounters:  02/01/16 172 lb 4 oz (78.132 kg)  01/23/16 171 lb 9.6 oz (77.837 kg)  01/17/16 171 lb (77.565 kg)    GENERAL:  well developed, well nourished, obese, not in acute distress HEENT: normocephalic, pink conjunctivae, anicteric sclerae, no xanthelasma, normal dentition, oropharynx clear NECK:  no neck vein engorgement, JVP normal, no hepatojugular reflux, carotid upstroke brisk and symmetric, no bruit, no thyromegaly, no lymphadenopathy LUNGS:  good respiratory effort, clear to auscultation bilaterally CV:  PMI not displaced, no thrills, no lifts, S1 and S2 within normal limits, no palpable S3 or S4, no murmurs, no rubs, no gallops ABD:  Soft, nontender, nondistended, normoactive bowel sounds, no abdominal aortic bruit, no  hepatomegaly, no splenomegaly MS: nontender back, no kyphosis, no scoliosis, no joint deformities EXT:  2+ DP/PT pulses, no edema, no varicosities, no cyanosis, no clubbing SKIN: warm, nondiaphoretic, normal turgor, no ulcers NEUROPSYCH: alert, oriented to person, place, and time, sensory/motor grossly intact, normal mood, appropriate affect  Recent Labs: 11/21/2015: ALT 10; TSH 3.460 01/17/2016: BUN 22*; Creatinine, Ser 1.07*; Hemoglobin 11.9*; Platelets 213; Potassium 4.0; Sodium 140   Lipid Panel 11/21/2015    Component Value Date/Time   CHOL 147 11/21/2015 1001   TRIG 123 11/21/2015 1001   HDL 51 11/21/2015 1001   LDLCALC 71 11/21/2015 1001     Other studies Reviewed:  EKG:  EKG is ordered today. The ekg ordered today was personally reviewed by me and it reveals sinus bradycardia, 55 BPM. Left bundle branch block.  Additional studies/ records that were reviewed personally reviewed by me today include: None available   ASSESSMENT AND PLAN:  1. Left bundle branch block  2. preoperative cardiac evaluation 3. hypertension 4. hyperlipidemia 5. Obesity  In terms of the left bundle branch block, recommend stress testing to evaluate for cardiac ischemia. Patient has significant risk factors for coronary artery disease including age, history of prior peripheral vascular disease, smoking history. Patient denies symptoms at this point but due to this abnormality on the EKG would like to proceed with stress testing to evaluate cardiac risk prior to surgery.   In terms of preoperative cardiac evaluation, patient reports to be asymptomatic. However she has an abnormal EKG that warrants further evaluation prior to risk stratification.  In terms of her hypertension, blood pressure is elevated today. According to the patient, it is much better at home. She is already on several medications for her blood pressure. Nephrology is managing her antihypertensive regimen. Recommend a blood pressure  log. Discussed in detail importance of sodium restriction in her diet as well as other lifestyle changes.  In terms of hyperlipidemia, because of a history of peripheral vascular disease recommended LDL goal is less than 70. Continue Crestor for now. PCP following labs.  In terms of obesity, Body mass index is 31.5 kg/(m^2).Marland Kitchen Recommend aggressive weight loss through diet and increased physical activity, once we have information from stress testing.   Current medicines are reviewed at length with the patient today.  The patient does not have concerns regarding medicines.  Labs/ tests ordered today include:  Orders Placed This Encounter  Procedures  . NM Myocar Multi W/Spect W/Wall Motion / EF  . EKG 12-Lead  . ECHOCARDIOGRAM COMPLETE    I had a lengthy and detailed discussion with the patient regarding diagnoses, prognosis, diagnostic options, treatment options..   I counseled the patient on importance of lifestyle modification including heart healthy diet, regular physical activity. Disposition:   FU with undersigned after tests   Signed, Almond Lint, MD  02/01/2016 3:36 PM    [facility]

## 2016-02-04 ENCOUNTER — Other Ambulatory Visit: Payer: Self-pay | Admitting: Family Medicine

## 2016-02-04 NOTE — Telephone Encounter (Signed)
Dec 2016 TSH reviewed; Rx approved

## 2016-02-07 ENCOUNTER — Telehealth: Payer: Self-pay | Admitting: *Deleted

## 2016-02-07 NOTE — Telephone Encounter (Signed)
[facility]. Verified with patient regarding her procedure, checkin, medications to hold for test, and just overall general instructions. She verbalized understanding and had no further questions at this time. Let her know that we would call with results when available and that we would also see her at the next appointment on 02/21/16.

## 2016-02-08 ENCOUNTER — Encounter
Admission: RE | Admit: 2016-02-08 | Discharge: 2016-02-08 | Disposition: A | Discharge status: Home / Self Care | Payer: Medicare Other | Source: Ambulatory Visit | Attending: Cardiology | Admitting: Cardiology

## 2016-02-08 DIAGNOSIS — I447 Left bundle-branch block, unspecified: Secondary | ICD-10-CM | POA: Insufficient documentation

## 2016-02-08 DIAGNOSIS — R9431 Abnormal electrocardiogram [ECG] [EKG]: Secondary | ICD-10-CM | POA: Insufficient documentation

## 2016-02-08 MED ORDER — REGADENOSON 0.4 MG/5ML IV SOLN
0.4000 mg | Freq: Once | INTRAVENOUS | Status: AC
  Administered 2016-02-08: 0.4 mg via INTRAVENOUS

## 2016-02-08 MED ORDER — TECHNETIUM TC 99M SESTAMIBI - CARDIOLITE
31.0400 | Freq: Once | INTRAVENOUS | Status: AC | PRN
  Administered 2016-02-08: 31.04 via INTRAVENOUS

## 2016-02-08 MED ORDER — TECHNETIUM TC 99M SESTAMIBI - CARDIOLITE
13.0000 | Freq: Once | INTRAVENOUS | Status: AC | PRN
  Administered 2016-02-08: 13.73 via INTRAVENOUS

## 2016-02-09 ENCOUNTER — Encounter: Payer: Self-pay | Admitting: *Deleted

## 2016-02-10 LAB — NM MYOCAR MULTI W/SPECT W/WALL MOTION / EF
CHL CUP MPHR: 144 {beats}/min
CHL CUP NUCLEAR SDS: 3
CHL CUP STRESS STAGE 1 SPEED: 0 mph
CHL CUP STRESS STAGE 2 GRADE: 0 %
CHL CUP STRESS STAGE 2 HR: 78 {beats}/min
CHL CUP STRESS STAGE 2 SPEED: 0 mph
CHL CUP STRESS STAGE 3 HR: 101 {beats}/min
CHL CUP STRESS STAGE 3 SPEED: 0 mph
CHL CUP STRESS STAGE 4 SPEED: 0 mph
CHL CUP STRESS STAGE 5 GRADE: 0 %
CHL CUP STRESS STAGE 5 HR: 87 {beats}/min
CSEPHR: 71 %
CSEPPMHR: 70 %
Estimated workload: 1 METS
Exercise duration (min): 0 min
Exercise duration (sec): 0 s
LV dias vol: 70 mL (ref 46–106)
LVSYSVOL: 21 mL
Peak HR: 101 {beats}/min
Rest HR: 76 {beats}/min
SRS: 4
SSS: 6
Stage 1 Grade: 0 %
Stage 1 HR: 77 {beats}/min
Stage 3 Grade: 0 %
Stage 4 Grade: 0 %
Stage 4 HR: 100 {beats}/min
Stage 5 Speed: 0 mph
TID: 0.98

## 2016-02-14 ENCOUNTER — Other Ambulatory Visit: Payer: Self-pay

## 2016-02-14 ENCOUNTER — Ambulatory Visit (INDEPENDENT_AMBULATORY_CARE_PROVIDER_SITE_OTHER): Payer: Medicare Other

## 2016-02-14 DIAGNOSIS — R9431 Abnormal electrocardiogram [ECG] [EKG]: Secondary | ICD-10-CM | POA: Diagnosis not present

## 2016-02-16 NOTE — Telephone Encounter (Signed)
Patient was seen by cardiologist on 02/01/16.

## 2016-02-21 ENCOUNTER — Encounter (INDEPENDENT_AMBULATORY_CARE_PROVIDER_SITE_OTHER): Payer: Self-pay

## 2016-02-21 ENCOUNTER — Encounter: Payer: Self-pay | Admitting: Cardiology

## 2016-02-21 ENCOUNTER — Ambulatory Visit (INDEPENDENT_AMBULATORY_CARE_PROVIDER_SITE_OTHER): Payer: Medicare Other | Admitting: Cardiology

## 2016-02-21 VITALS — BP 142/94 | HR 60 | Ht 62.0 in | Wt 174.4 lb

## 2016-02-21 DIAGNOSIS — Z01818 Encounter for other preprocedural examination: Secondary | ICD-10-CM | POA: Diagnosis not present

## 2016-02-21 DIAGNOSIS — I10 Essential (primary) hypertension: Secondary | ICD-10-CM

## 2016-02-21 DIAGNOSIS — E669 Obesity, unspecified: Secondary | ICD-10-CM

## 2016-02-21 DIAGNOSIS — I447 Left bundle-branch block, unspecified: Secondary | ICD-10-CM

## 2016-02-21 DIAGNOSIS — E785 Hyperlipidemia, unspecified: Secondary | ICD-10-CM | POA: Diagnosis not present

## 2016-02-21 NOTE — Patient Instructions (Signed)
Medication Instructions:  Your physician recommends that you continue on your current medications as directed. Please refer to the Current Medication list given to you today.   Labwork: None Ordered  Testing/Procedures: None Ordered  Follow-Up: Your physician recommends that you schedule a follow-up appointment in: 4-6 months with Dr. Alvino ChapelIngal   Any Other Special Instructions Will Be Listed Below (If Applicable).     If you need a refill on your cardiac medications before your next appointment, please call your pharmacy.

## 2016-02-21 NOTE — Progress Notes (Signed)
Cardiology Office Note   Date:  02/21/2016   ID:  NIAH HEINLE, DOB 08/19/1941, MRN [id number]  Referring Doctor:  Baruch Gouty, MD   Cardiologist:   Almond Lint, MD   Reason for consultation:  Chief Complaint  Patient presents with  . Follow-up    no complaints   Follow-up for left bundle branch block, preoperative cardiac evaluation   History of Present Illness: Janice [name] is a 75 y.o. female who presents for follow-up of LBBB, preoperative evaluation.   Patient underwent stress testing and echocardiogram for better cardiac risk assessment prior to surgery.  Per patient, she does not have any chest pain, chest tightness, shortness of breath. She walks up and down flights stairs at home without any difficulty, no chest pain or shortness of breath with this. She is not aware of any abnormalities on her EKG prior to being seen by her PCP.  Patient denies loss of consciousness, cough, cold, abdominal pain, swelling, PND, orthopnea. She is reported to snore but does not complain of any sleep abnormalities. No excessive fatigue during the day.  Regarding her hypertension, she reports that her blood pressure normally is in the 130s to 140s systolic. She says that the nephrologist is the one managing her antihypertensive medication.   ROS:  Please see the history of present illness. Aside from mentioned under HPI, all other systems are reviewed and negative.     Past Medical History  Diagnosis Date  . Hyperlipidemia   . Hypertension   . Subclavian arterial stenosis (HCC)   . Thyroid disease   . Hernia, ventral   . Chronic kidney disease    Peripheral vascular disease  Past Surgical History  Procedure Laterality Date  . Cholecystectomy    . Artery bypass rechanneling  2010     reports that she quit smoking about 7 years ago. Her smoking use included Cigarettes. She has never used smokeless tobacco. She reports that she does not drink alcohol or use illicit drugs.    family history includes Diabetes in her sister; Heart disease in her father and mother; Hypertension in her father, mother, and sister. There is no history of Cancer, COPD, or Stroke.   Current Outpatient Prescriptions  Medication Sig Dispense Refill  . amLODipine (NORVASC) 10 MG tablet Take 10 mg by mouth daily.  6  . aspirin 81 MG tablet Take 81 mg by mouth daily.    . Cholecalciferol (VITAMIN D3) 1000 UNITS CAPS Take 1,000 Int'l Units by mouth daily.    Marland Kitchen levothyroxine (SYNTHROID, LEVOTHROID) 112 MCG tablet 1 TABLET DAILY ORAL 90 tablet 2  . losartan (COZAAR) 50 MG tablet Take 50 mg by mouth daily.  3  . metoprolol-hydrochlorothiazide (LOPRESSOR HCT) 100-25 MG tablet TAKE 1/2 TABLET BY MOUTH TWICE A DAY 30 tablet 2  . rosuvastatin (CRESTOR) 10 MG tablet TAKE 1 TABLET (10 MG TOTAL) BY MOUTH AT BEDTIME. 30 tablet 6   No current facility-administered medications for this visit.    Allergies: Review of patient's allergies indicates no known allergies.    PHYSICAL EXAM: VS:  BP 142/94 mmHg  Pulse 60  Ht  (1.575 m)  Wt 174 lb 6.4 oz (79.107 kg)  BMI 31.89 kg/m2 , Body mass index is 31.89 kg/(m^2). Wt Readings from Last 3 Encounters:  02/21/16 174 lb 6.4 oz (79.107 kg)  02/01/16 172 lb 4 oz (78.132 kg)  01/23/16 171 lb 9.6 oz (77.837 kg)    GENERAL:  well developed, well  nourished, obese, not in acute distress HEENT: normocephalic, pink conjunctivae, anicteric sclerae, no xanthelasma, normal dentition, oropharynx clear NECK:  no neck vein engorgement, JVP normal, no hepatojugular reflux, carotid upstroke brisk and symmetric, no bruit, no thyromegaly, no lymphadenopathy LUNGS:  good respiratory effort, clear to auscultation bilaterally CV:  PMI not displaced, no thrills, no lifts, S1 and S2 within normal limits, no palpable S3 or S4, no murmurs, no rubs, no gallops ABD:  Soft, nontender, nondistended, normoactive bowel sounds, no abdominal aortic bruit, no hepatomegaly, no  splenomegaly MS: nontender back, no kyphosis, no scoliosis, no joint deformities EXT:  2+ DP/PT pulses, no edema, no varicosities, no cyanosis, no clubbing SKIN: warm, nondiaphoretic, normal turgor, no ulcers NEUROPSYCH: alert, oriented to person, place, and time, sensory/motor grossly intact, normal mood, appropriate affect  Recent Labs: 11/21/2015: ALT 10; TSH 3.460 01/17/2016: BUN 22*; Creatinine, Ser 1.07*; Hemoglobin 11.9*; Platelets 213; Potassium 4.0; Sodium 140   Lipid Panel 11/21/2015    Component Value Date/Time   CHOL 147 11/21/2015 1001   TRIG 123 11/21/2015 1001   HDL 51 11/21/2015 1001   LDLCALC 71 11/21/2015 1001     Other studies Reviewed:  EKG:   The ekg ordered 02/01/2016 was personally reviewed by me and it reveals sinus bradycardia, 55 BPM. Left bundle branch block.  Additional studies/ records that were reviewed personally reviewed by me today include:  Echocardiogram 02/14/2016: Left ventricle: The cavity size was normal. Systolic function was  normal. The estimated ejection fraction was in the range of 55%  to 65%. Wall motion was normal; there were no regional wall  motion abnormalities. Left ventricular diastolic function  parameters were normal. - Mitral valve: There was mild regurgitation. - Left atrium: The atrium was normal in size. - Right ventricle: Systolic function was normal.   Pulmonary arteries: Systolic pressure was mildly elevated. PA  peak pressure: 38 mm Hg (S).  Pharmacologic nuclear stress test 02/10/2016: Pharmacological myocardial perfusion imaging study with no significant ischemia Normal wall motion, EF estimated at 72% No EKG changes concerning for ischemia at peak stress or in recovery. Resting EKG with left bundle branch block Low risk scan   ASSESSMENT AND PLAN:  1. Left bundle branch block  2. preoperative cardiac evaluation 3. hypertension 4. hyperlipidemia 5. Obesity  In terms of preoperative cardiac  evaluation, patient reports to be asymptomatic. No ischemia noted on stress test from 02/10/2016. Ejection fraction is normal 55-65% from echocardiogram 02/14/2016. No evidence of congestive heart failure. No indication for further testing at this point. Patient is likely intermediate cardiac risk for moderate risk surgery.  In terms of her hypertension, blood pressure is minimally elevated today. According to the patient, it is much better at home. She is already on several medications for her blood pressure. Nephrology is managing her antihypertensive regimen. Recommend a blood pressure log. Discussed in detail importance of sodium restriction in her diet as well as other lifestyle changes.  In terms of hyperlipidemia, because of a history of peripheral vascular disease recommended LDL goal is less than 70. Continue Crestor for now. PCP following labs.  In terms of obesity, Body mass index is 31.89 kg/(m^2).Marland Kitchen Recommend aggressive weight loss through diet and increased physical activity.   Current medicines are reviewed at length with the patient today.  The patient does not have concerns regarding medicines.  Labs/ tests ordered today include:  No orders of the defined types were placed in this encounter.    I had a lengthy and detailed  discussion with the patient regarding diagnoses, prognosis, diagnostic options, treatment options..   I counseled the patient on importance of lifestyle modification including heart healthy diet, regular physical activity.  Disposition:   FU with undersigned in 4-6 months  Signed, Almond LintAileen Jahnasia Tatum, MD  02/21/2016 11:24 AM    [facility]

## 2016-02-22 ENCOUNTER — Telehealth: Payer: Self-pay | Admitting: *Deleted

## 2016-02-22 NOTE — Addendum Note (Signed)
Addended by: Almond LintINGAL, Gregary Blackard on: 02/22/2016 10:42 AM   Modules accepted: Level of Service

## 2016-02-22 NOTE — Telephone Encounter (Signed)
Spoke with patient's daughter today and surgery has [facility].   A pre-op visit has been arranged accordingly.

## 2016-02-22 NOTE — Telephone Encounter (Signed)
-----   Message from Earline MayotteJeffrey W Byrnett, MD sent at 02/21/2016  2:14 PM EDT ----- Regarding: FW: See cardiology note re: clearance Please arrange a new surgery date. She will need a brief ov.  ----- Message -----    From: Kerman PasseyMelinda P Lada, MD    Sent: 02/21/2016  12:00 PM      To: Earline MayotteJeffrey W Byrnett, MD Subject: See cardiology note re: clearance              Greetings! Patient has completed her cardiac work-up. "No indication for further testing at this point. Patient is likely intermediate cardiac risk for moderate risk surgery." per Dr. Gearlean AlfIngal's note today.  I'll clear her medically and you can proceed with planned hernia repair.  Peace, IrrigonMelinda

## 2016-02-24 ENCOUNTER — Other Ambulatory Visit: Payer: Self-pay | Admitting: General Surgery

## 2016-02-27 ENCOUNTER — Other Ambulatory Visit: Payer: Medicare Other

## 2016-02-27 ENCOUNTER — Encounter: Payer: Self-pay | Admitting: *Deleted

## 2016-02-27 NOTE — Patient Instructions (Addendum)
  Your procedure is scheduled on 03/05/16 Report to Day Surgery. [facility] To find out your arrival time please call [phone] between 1PM - 3PM on 03/02/16  Remember: Instructions that are not followed completely may result in serious medical risk, up to and including death, or upon the discretion of your surgeon and anesthesiologist your surgery may need to be rescheduled.    __x__ 1. Do not eat food or drink liquids after midnight. No gum chewing or hard candies.     __x__ 2. No Alcohol for 24 hours before or after surgery.   ____ 3. Bring all medications with you on the day of surgery if instructed.    __x__ 4. Notify your doctor if there is any change in your medical condition     (cold, fever, infections).     Do not wear jewelry, make-up, hairpins, clips or nail polish.  Do not wear lotions, powders, or perfumes. You may wear deodorant.  Do not shave 48 hours prior to surgery. Men may shave face and neck.  Do not bring valuables to the hospital.    [facility] is not responsible for any belongings or valuables.               Contacts, dentures or bridgework may not be worn into surgery.  Leave your suitcase in the car. After surgery it may be brought to your room.  For patients admitted to the hospital, discharge time is determined by your                treatment team.   Patients discharged the day of surgery will not be allowed to drive home.   Please read over the following fact sheets that you were given:   Surgical Site Infection Prevention   ____ Take these medicines the morning of surgery with A SIP OF WATER:    1. amlodipine  2. levothyroxine  3. losartan  4.  5.  6.  ____ Fleet Enema (as directed)   __x__ Use CHG Soap as directed  ____ Use inhalers on the day of surgery  ____ Stop metformin 2 days prior to surgery    ____ Take 1/2 of usual insulin dose the night before surgery and none on the morning of surgery.   ____ Stop  Coumadin/Plavix/aspirin on ____ Stop Anti-inflammatories on   ____ Stop supplements until after surgery.    ____ Bring C-Pap to the hospital.    Patient has chg soap and instructions from previous preop visit. Reviewed

## 2016-02-29 ENCOUNTER — Encounter: Payer: Self-pay | Admitting: General Surgery

## 2016-02-29 ENCOUNTER — Ambulatory Visit (INDEPENDENT_AMBULATORY_CARE_PROVIDER_SITE_OTHER): Payer: Medicare Other | Admitting: General Surgery

## 2016-02-29 VITALS — BP 162/82 | HR 58 | Resp 16 | Ht 62.0 in | Wt 174.0 lb

## 2016-02-29 DIAGNOSIS — K439 Ventral hernia without obstruction or gangrene: Secondary | ICD-10-CM

## 2016-02-29 NOTE — Patient Instructions (Addendum)
Hernia, Adult A hernia is the bulging of an organ or tissue through a weak spot in the muscles of the abdomen (abdominal wall). Hernias develop most often near the navel or groin. There are many kinds of hernias. Common kinds include:  Femoral hernia. This kind of hernia develops under the groin in the upper thigh area.  Inguinal hernia. This kind of hernia develops in the groin or scrotum.  Umbilical hernia. This kind of hernia develops near the navel.  Hiatal hernia. This kind of hernia causes part of the stomach to be pushed up into the chest.  Incisional hernia. This kind of hernia bulges through a scar from an abdominal surgery. CAUSES This condition may be caused by:  Heavy lifting.  Coughing over a long period of time.  Straining to have a bowel movement.  An incision made during an abdominal surgery.  A birth defect (congenital defect).  Excess weight or obesity.  Smoking.  Poor nutrition.  Cystic fibrosis.  Excess fluid in the abdomen.  Undescended testicles. SYMPTOMS Symptoms of a hernia include:  A lump on the abdomen. This is the first sign of a hernia. The lump may become more obvious with standing, straining, or coughing. It may get bigger over time if it is not treated or if the condition causing it is not treated.  Pain. A hernia is usually painless, but it may become painful over time if treatment is delayed. The pain is usually dull and may get worse with standing or lifting heavy objects. Sometimes a hernia gets tightly squeezed in the weak spot (strangulated) or stuck there (incarcerated) and causes additional symptoms. These symptoms may include:  Vomiting.  Nausea.  Constipation.  Irritability. DIAGNOSIS A hernia may be diagnosed with:  A physical exam. During the exam your health care provider may ask you to cough or to make a specific movement, because a hernia is usually more visible when you move.  Imaging tests. These can  include:  X-rays.  Ultrasound.  CT scan. TREATMENT A hernia that is small and painless may not need to be treated. A hernia that is large or painful may be treated with surgery. Inguinal hernias may be treated with surgery to prevent incarceration or strangulation. Strangulated hernias are always treated with surgery, because lack of blood to the trapped organ or tissue can cause it to die. Surgery to treat a hernia involves pushing the bulge back into place and repairing the weak part of the abdomen. HOME CARE INSTRUCTIONS  Avoid straining.  Do not lift anything heavier than 10 lb (4.5 kg).  Lift with your leg muscles, not your back muscles. This helps avoid strain.  When coughing, try to cough gently.  Prevent constipation. Constipation leads to straining with bowel movements, which can make a hernia worse or cause a hernia repair to break down. You can prevent constipation by:  Eating a high-fiber diet that includes plenty of fruits and vegetables.  Drinking enough fluids to keep your urine clear or pale yellow. Aim to drink 6-8 glasses of water per day.  Using a stool softener as directed by your health care provider.  Lose weight, if you are overweight.  Do not use any tobacco products, including cigarettes, chewing tobacco, or electronic cigarettes. If you need help quitting, ask your health care provider.  Keep all follow-up visits as directed by your health care provider. This is important. Your health care provider may need to monitor your condition. SEEK MEDICAL CARE IF:  You have   swelling, redness, and pain in the affected area.  Your bowel habits change. SEEK IMMEDIATE MEDICAL CARE IF:  You have a fever.  You have abdominal pain that is getting worse.  You feel nauseous or you vomit.  You cannot push the hernia back in place by gently pressing on it while you are lying down.  The hernia:  Changes in shape or size.  Is stuck outside the  abdomen.  Becomes discolored.  Feels hard or tender.   This information is not intended to replace advice given to you by your health care provider. Make sure you discuss any questions you have with your health care provider.   Document Released: 11/19/2005 Document Revised: 12/10/2014 Document Reviewed: 09/29/2014 Elsevier Interactive Patient Education 2016 [employer]  [facility].

## 2016-02-29 NOTE — Progress Notes (Signed)
Patient ID: Janice ChesterfieldLinda L [name], female   DOB: 1941/06/23, 75 y.o.   MRN: [id number]  Chief Complaint  Patient presents with  . Pre-op Exam    ventral hernia    HPI Janice [name] is a 75 y.o. female.  Here today for pre op ventral hernia scheduled for 03-05-16. Cardiac clearance was low-moderate risk. Patient asymptomatic regarding LBBB.  Was [place] in [place] earlier this year with an episode of incarceration of her midline hernia.  Accompanied by her daughter today.    HPI  Past Medical History  Diagnosis Date  . Hyperlipidemia   . Hypertension   . Subclavian arterial stenosis (HCC)   . Thyroid disease   . Hernia, ventral   . Chronic kidney disease     Past Surgical History  Procedure Laterality Date  . Cholecystectomy    . Artery bypass rechanneling  2010    Family History  Problem Relation Age of Onset  . Heart disease Mother   . Hypertension Mother   . Heart disease Father   . Hypertension Father   . Hypertension Sister   . Diabetes Sister   . Cancer Neg Hx   . COPD Neg Hx   . Stroke Neg Hx     Social History Social History  Substance Use Topics  . Smoking status: Former Smoker    Types: Cigarettes    Quit date: 12/03/2008  . Smokeless tobacco: Never Used  . Alcohol Use: No    No Known Allergies  Current Outpatient Prescriptions  Medication Sig Dispense Refill  . amLODipine (NORVASC) 10 MG tablet Take 10 mg by mouth daily.  6  . aspirin 81 MG tablet Take 81 mg by mouth daily.    . Cholecalciferol (VITAMIN D3) 1000 UNITS CAPS Take 1,000 Int'l Units by mouth daily.    Marland Kitchen. levothyroxine (SYNTHROID, LEVOTHROID) 112 MCG tablet 1 TABLET DAILY ORAL 90 tablet 2  . losartan (COZAAR) 50 MG tablet Take 50 mg by mouth daily.  3  . metoprolol-hydrochlorothiazide (LOPRESSOR HCT) 100-25 MG tablet TAKE 1/2 TABLET BY MOUTH TWICE A DAY 30 tablet 2  . rosuvastatin (CRESTOR) 10 MG tablet TAKE 1 TABLET (10 MG TOTAL) BY MOUTH AT BEDTIME. 30 tablet 6   No current  facility-administered medications for this visit.    Review of Systems Review of Systems  Constitutional: Negative.   Respiratory: Negative.   Cardiovascular: Negative.     Blood pressure 162/82, pulse 58, resp. rate 16, height 5\' 2"  (1.575 m), weight 174 lb (78.926 kg).  Physical Exam Physical Exam  Constitutional: She is oriented to person, place, and time. She appears well-developed and well-nourished.  HENT:  Mouth/Throat: Oropharynx is clear and moist.  Eyes: Conjunctivae are normal. No scleral icterus.  Neck: Neck supple.  Cardiovascular: Normal rate, regular rhythm and normal heart sounds.   Pulmonary/Chest: Effort normal and breath sounds normal.  Abdominal: Soft. Normal appearance. A hernia is present. Hernia confirmed positive in the ventral area.    Hernia present right of midline.   Lymphadenopathy:    She has no cervical adenopathy.  Neurological: She is alert and oriented to person, place, and time.  Skin: Skin is warm and dry.  Psychiatric: Her behavior is normal.    Data Reviewed Cardiology notes   Assessment    Candidate for ventral hernia repair.     Plan    Pros and cons of elective surgery reviewed.      Hernia precautions and incarceration were discussed with the patient.  If they develop symptoms of an incarcerated hernia, they were encouraged to seek prompt medical attention.  I have recommended repair of the hernia using mesh on an outpatient basis in the near future. The risk of infection was reviewed. The role of prosthetic mesh to minimize the risk of recurrence was reviewed.  Patient's surgery is scheduled for 03-05-16 at [facility].   PCP:  Baruch Gouty  This information has been scribed by Dorathy Daft RN, BSN,BC.   [name] 03/01/2016, 3:01 PM

## 2016-03-05 ENCOUNTER — Inpatient Hospital Stay
Admission: RE | Admit: 2016-03-05 | Discharge: 2016-03-07 | DRG: 355 | Disposition: A | Discharge status: Home / Self Care | Payer: Medicare Other | Source: Ambulatory Visit | Attending: General Surgery | Admitting: General Surgery

## 2016-03-05 ENCOUNTER — Encounter
Admission: RE | Disposition: A | Discharge status: Home / Self Care | Payer: Self-pay | Source: Ambulatory Visit | Attending: General Surgery

## 2016-03-05 ENCOUNTER — Ambulatory Visit: Payer: Medicare Other | Admitting: Anesthesiology

## 2016-03-05 ENCOUNTER — Encounter: Payer: Self-pay | Admitting: Anesthesiology

## 2016-03-05 DIAGNOSIS — Z9889 Other specified postprocedural states: Secondary | ICD-10-CM

## 2016-03-05 DIAGNOSIS — Z823 Family history of stroke: Secondary | ICD-10-CM

## 2016-03-05 DIAGNOSIS — K439 Ventral hernia without obstruction or gangrene: Secondary | ICD-10-CM | POA: Diagnosis not present

## 2016-03-05 DIAGNOSIS — K432 Incisional hernia without obstruction or gangrene: Secondary | ICD-10-CM | POA: Diagnosis not present

## 2016-03-05 DIAGNOSIS — Z7982 Long term (current) use of aspirin: Secondary | ICD-10-CM | POA: Diagnosis not present

## 2016-03-05 DIAGNOSIS — K660 Peritoneal adhesions (postprocedural) (postinfection): Secondary | ICD-10-CM | POA: Diagnosis present

## 2016-03-05 DIAGNOSIS — I129 Hypertensive chronic kidney disease with stage 1 through stage 4 chronic kidney disease, or unspecified chronic kidney disease: Secondary | ICD-10-CM | POA: Diagnosis present

## 2016-03-05 DIAGNOSIS — N189 Chronic kidney disease, unspecified: Secondary | ICD-10-CM | POA: Diagnosis present

## 2016-03-05 DIAGNOSIS — Z8249 Family history of ischemic heart disease and other diseases of the circulatory system: Secondary | ICD-10-CM | POA: Diagnosis not present

## 2016-03-05 DIAGNOSIS — Z87891 Personal history of nicotine dependence: Secondary | ICD-10-CM

## 2016-03-05 DIAGNOSIS — Z79899 Other long term (current) drug therapy: Secondary | ICD-10-CM

## 2016-03-05 DIAGNOSIS — I739 Peripheral vascular disease, unspecified: Secondary | ICD-10-CM | POA: Diagnosis not present

## 2016-03-05 DIAGNOSIS — Z825 Family history of asthma and other chronic lower respiratory diseases: Secondary | ICD-10-CM | POA: Diagnosis not present

## 2016-03-05 DIAGNOSIS — Z9049 Acquired absence of other specified parts of digestive tract: Secondary | ICD-10-CM

## 2016-03-05 DIAGNOSIS — Z833 Family history of diabetes mellitus: Secondary | ICD-10-CM

## 2016-03-05 HISTORY — PX: VENTRAL HERNIA REPAIR: SHX424

## 2016-03-05 HISTORY — PX: INSERTION OF MESH: SHX5868

## 2016-03-05 HISTORY — PX: HERNIA REPAIR: SHX51

## 2016-03-05 LAB — CBC
HCT: 33.1 % — ABNORMAL LOW (ref 35.0–47.0)
HEMOGLOBIN: 11 g/dL — AB (ref 12.0–16.0)
MCH: 28.6 pg (ref 26.0–34.0)
MCHC: 33.3 g/dL (ref 32.0–36.0)
MCV: 85.9 fL (ref 80.0–100.0)
PLATELETS: 217 10*3/uL (ref 150–440)
RBC: 3.85 MIL/uL (ref 3.80–5.20)
RDW: 13.8 % (ref 11.5–14.5)
WBC: 16.2 10*3/uL — ABNORMAL HIGH (ref 3.6–11.0)

## 2016-03-05 LAB — CREATININE, SERUM
CREATININE: 1.03 mg/dL — AB (ref 0.44–1.00)
GFR calc Af Amer: 60 mL/min — ABNORMAL LOW (ref >60)
GFR, EST NON AFRICAN AMERICAN: 51 mL/min — AB (ref >60)

## 2016-03-05 SURGERY — REPAIR, HERNIA, VENTRAL
Anesthesia: General | Wound class: Clean

## 2016-03-05 MED ORDER — HYDROCHLOROTHIAZIDE 12.5 MG PO CAPS
12.5000 mg | ORAL_CAPSULE | Freq: Two times a day (BID) | ORAL | Status: DC
  Administered 2016-03-05 – 2016-03-07 (×4): 12.5 mg via ORAL
  Filled 2016-03-05 (×4): qty 1

## 2016-03-05 MED ORDER — FAMOTIDINE 20 MG PO TABS
20.0000 mg | ORAL_TABLET | Freq: Once | ORAL | Status: AC
  Administered 2016-03-05: 20 mg via ORAL

## 2016-03-05 MED ORDER — CEFAZOLIN SODIUM-DEXTROSE 2-4 GM/100ML-% IV SOLN
INTRAVENOUS | Status: AC
Start: 2016-03-05 — End: 2016-03-05
  Filled 2016-03-05: qty 100

## 2016-03-05 MED ORDER — OXYCODONE HCL 5 MG PO TABS: 5.0000 mg | ORAL_TABLET | ORAL | Status: DC | PRN

## 2016-03-05 MED ORDER — HYDROMORPHONE HCL 1 MG/ML IJ SOLN
0.2500 mg | INTRAMUSCULAR | Status: DC | PRN
  Administered 2016-03-05 (×2): 0.5 mg via INTRAVENOUS

## 2016-03-05 MED ORDER — ACETAMINOPHEN 650 MG RE SUPP: 650.0000 mg | RECTAL | Status: DC

## 2016-03-05 MED ORDER — LEVOTHYROXINE SODIUM 112 MCG PO TABS
112.0000 ug | ORAL_TABLET | Freq: Every day | ORAL | Status: DC
  Administered 2016-03-06 – 2016-03-07 (×2): 112 ug via ORAL
  Filled 2016-03-05 (×2): qty 1

## 2016-03-05 MED ORDER — ONDANSETRON HCL 4 MG PO TABS: 4.0000 mg | ORAL_TABLET | Freq: Four times a day (QID) | ORAL | Status: DC | PRN

## 2016-03-05 MED ORDER — BUPIVACAINE HCL (PF) 0.5 % IJ SOLN
INTRAMUSCULAR | Status: AC
  Filled 2016-03-05: qty 30

## 2016-03-05 MED ORDER — METOPROLOL TARTRATE 50 MG PO TABS
50.0000 mg | ORAL_TABLET | Freq: Two times a day (BID) | ORAL | Status: DC
  Administered 2016-03-05 – 2016-03-07 (×4): 50 mg via ORAL
  Filled 2016-03-05 (×4): qty 1

## 2016-03-05 MED ORDER — ACETAMINOPHEN 10 MG/ML IV SOLN
INTRAVENOUS | Status: DC | PRN
  Administered 2016-03-05: 1000 mg via INTRAVENOUS

## 2016-03-05 MED ORDER — LACTATED RINGERS IV SOLN
INTRAVENOUS | Status: DC
  Administered 2016-03-05 – 2016-03-06 (×3): via INTRAVENOUS

## 2016-03-05 MED ORDER — FENTANYL CITRATE (PF) 100 MCG/2ML IJ SOLN
INTRAMUSCULAR | Status: AC
  Filled 2016-03-05: qty 2

## 2016-03-05 MED ORDER — FENTANYL CITRATE (PF) 100 MCG/2ML IJ SOLN
25.0000 ug | INTRAMUSCULAR | Status: AC | PRN
  Administered 2016-03-05 (×6): 25 ug via INTRAVENOUS

## 2016-03-05 MED ORDER — PROPOFOL 10 MG/ML IV BOLUS
INTRAVENOUS | Status: DC | PRN
  Administered 2016-03-05: 100 mg via INTRAVENOUS

## 2016-03-05 MED ORDER — KETOROLAC TROMETHAMINE 30 MG/ML IJ SOLN
INTRAMUSCULAR | Status: DC | PRN
  Administered 2016-03-05: 30 mg via INTRAVENOUS

## 2016-03-05 MED ORDER — LIDOCAINE HCL (CARDIAC) 20 MG/ML IV SOLN
INTRAVENOUS | Status: DC | PRN
  Administered 2016-03-05: 100 mg via INTRAVENOUS

## 2016-03-05 MED ORDER — CEFAZOLIN SODIUM-DEXTROSE 2-4 GM/100ML-% IV SOLN
2.0000 g | INTRAVENOUS | Status: AC
  Administered 2016-03-05: 2 g via INTRAVENOUS

## 2016-03-05 MED ORDER — PHENYLEPHRINE HCL 10 MG/ML IJ SOLN
INTRAMUSCULAR | Status: DC | PRN
  Administered 2016-03-05 (×3): 100 ug via INTRAVENOUS

## 2016-03-05 MED ORDER — FENTANYL CITRATE (PF) 100 MCG/2ML IJ SOLN
INTRAMUSCULAR | Status: DC | PRN
  Administered 2016-03-05 (×4): 50 ug via INTRAVENOUS

## 2016-03-05 MED ORDER — ACETAMINOPHEN 10 MG/ML IV SOLN
INTRAVENOUS | Status: AC
  Filled 2016-03-05: qty 100

## 2016-03-05 MED ORDER — MIDAZOLAM HCL 2 MG/2ML IJ SOLN
INTRAMUSCULAR | Status: DC | PRN
  Administered 2016-03-05: 2 mg via INTRAVENOUS

## 2016-03-05 MED ORDER — METOPROLOL-HYDROCHLOROTHIAZIDE 100-25 MG PO TABS: 0.5000 | ORAL_TABLET | Freq: Two times a day (BID) | ORAL | Status: DC

## 2016-03-05 MED ORDER — CETYLPYRIDINIUM CHLORIDE 0.05 % MT LIQD
7.0000 mL | Freq: Two times a day (BID) | OROMUCOSAL | Status: DC
  Administered 2016-03-05 – 2016-03-07 (×4): 7 mL via OROMUCOSAL

## 2016-03-05 MED ORDER — FAMOTIDINE 20 MG PO TABS
ORAL_TABLET | ORAL | Status: AC
  Filled 2016-03-05: qty 1

## 2016-03-05 MED ORDER — SUCCINYLCHOLINE CHLORIDE 20 MG/ML IJ SOLN
INTRAMUSCULAR | Status: DC | PRN
  Administered 2016-03-05: 100 mg via INTRAVENOUS

## 2016-03-05 MED ORDER — ONDANSETRON HCL 4 MG/2ML IJ SOLN
INTRAMUSCULAR | Status: DC | PRN
  Administered 2016-03-05 (×2): 4 mg via INTRAVENOUS

## 2016-03-05 MED ORDER — ONDANSETRON HCL 4 MG/2ML IJ SOLN
4.0000 mg | Freq: Four times a day (QID) | INTRAMUSCULAR | Status: DC | PRN
  Administered 2016-03-05 (×2): 4 mg via INTRAVENOUS
  Filled 2016-03-05 (×2): qty 2

## 2016-03-05 MED ORDER — VITAMIN D 1000 UNITS PO TABS
1000.0000 [IU] | ORAL_TABLET | Freq: Every day | ORAL | Status: DC
  Administered 2016-03-06 – 2016-03-07 (×2): 1000 [IU] via ORAL
  Filled 2016-03-05 (×3): qty 1

## 2016-03-05 MED ORDER — ENOXAPARIN SODIUM 40 MG/0.4ML ~~LOC~~ SOLN
40.0000 mg | SUBCUTANEOUS | Status: DC
  Administered 2016-03-06 – 2016-03-07 (×2): 40 mg via SUBCUTANEOUS
  Filled 2016-03-05 (×2): qty 0.4

## 2016-03-05 MED ORDER — METOPROLOL TARTRATE 50 MG PO TABS
100.0000 mg | ORAL_TABLET | Freq: Once | ORAL | Status: AC
  Administered 2016-03-05: 100 mg via ORAL

## 2016-03-05 MED ORDER — SUGAMMADEX SODIUM 200 MG/2ML IV SOLN
INTRAVENOUS | Status: DC | PRN
  Administered 2016-03-05: 155.2 mg via INTRAVENOUS

## 2016-03-05 MED ORDER — CEFAZOLIN SODIUM 1 G IJ SOLR
2.0000 g | INTRAMUSCULAR | Status: DC
  Filled 2016-03-05: qty 20

## 2016-03-05 MED ORDER — HYDROMORPHONE HCL 1 MG/ML IJ SOLN
INTRAMUSCULAR | Status: AC
  Filled 2016-03-05: qty 1

## 2016-03-05 MED ORDER — ACETAMINOPHEN 325 MG PO TABS
650.0000 mg | ORAL_TABLET | ORAL | Status: DC
  Administered 2016-03-05 – 2016-03-07 (×10): 650 mg via ORAL
  Filled 2016-03-05 (×11): qty 2

## 2016-03-05 MED ORDER — MORPHINE SULFATE (PF) 2 MG/ML IV SOLN
2.0000 mg | INTRAVENOUS | Status: DC | PRN
  Administered 2016-03-05: 2 mg via INTRAVENOUS
  Filled 2016-03-05: qty 1

## 2016-03-05 MED ORDER — LACTATED RINGERS IV SOLN
INTRAVENOUS | Status: DC
  Administered 2016-03-05 (×4): via INTRAVENOUS

## 2016-03-05 MED ORDER — ROCURONIUM BROMIDE 100 MG/10ML IV SOLN
INTRAVENOUS | Status: DC | PRN
  Administered 2016-03-05: 50 mg via INTRAVENOUS

## 2016-03-05 MED ORDER — ASPIRIN EC 81 MG PO TBEC
81.0000 mg | DELAYED_RELEASE_TABLET | Freq: Every day | ORAL | Status: DC
  Administered 2016-03-06 – 2016-03-07 (×2): 81 mg via ORAL
  Filled 2016-03-05 (×3): qty 1

## 2016-03-05 MED ORDER — ONDANSETRON HCL 4 MG/2ML IJ SOLN: 4.0000 mg | Freq: Once | INTRAMUSCULAR | Status: DC | PRN

## 2016-03-05 MED ORDER — METOPROLOL TARTRATE 50 MG PO TABS
ORAL_TABLET | ORAL | Status: AC
  Filled 2016-03-05: qty 2

## 2016-03-05 MED ORDER — EPHEDRINE SULFATE 50 MG/ML IJ SOLN
INTRAMUSCULAR | Status: DC | PRN
  Administered 2016-03-05: 10 mg via INTRAVENOUS

## 2016-03-05 MED ORDER — LOSARTAN POTASSIUM 50 MG PO TABS
50.0000 mg | ORAL_TABLET | Freq: Every day | ORAL | Status: DC
  Administered 2016-03-06 – 2016-03-07 (×2): 50 mg via ORAL
  Filled 2016-03-05 (×3): qty 1

## 2016-03-05 MED ORDER — DEXAMETHASONE SODIUM PHOSPHATE 10 MG/ML IJ SOLN
INTRAMUSCULAR | Status: DC | PRN
  Administered 2016-03-05: 10 mg via INTRAVENOUS

## 2016-03-05 SURGICAL SUPPLY — 49 items
BINDER ABDOMINAL 12 ML 46-62 (SOFTGOODS) ×3 IMPLANT
BLADE SURG 15 STRL SS SAFETY (BLADE) ×3 IMPLANT
BULB RESERV EVAC DRAIN JP 100C (MISCELLANEOUS) ×3 IMPLANT
CANISTER SUCT 1200ML W/VALVE (MISCELLANEOUS) ×3 IMPLANT
CATH TRAY 16F METER LATEX (MISCELLANEOUS) ×3 IMPLANT
CHLORAPREP W/TINT 26ML (MISCELLANEOUS) ×3 IMPLANT
CLOSURE WOUND 1/2 X4 (GAUZE/BANDAGES/DRESSINGS)
DRAIN CHANNEL JP 15F RND 16 (MISCELLANEOUS) ×3 IMPLANT
DRAPE CHEST BREAST 77X106 FENE (MISCELLANEOUS) IMPLANT
DRAPE LAPAROTOMY 100X77 ABD (DRAPES) ×3 IMPLANT
DRSG OPSITE POSTOP 4X12 (GAUZE/BANDAGES/DRESSINGS) ×3 IMPLANT
DRSG TEGADERM 4X4.75 (GAUZE/BANDAGES/DRESSINGS) IMPLANT
DRSG TELFA 3X8 NADH (GAUZE/BANDAGES/DRESSINGS) IMPLANT
ELECT REM PT RETURN 9FT ADLT (ELECTROSURGICAL) ×3
ELECTRODE REM PT RTRN 9FT ADLT (ELECTROSURGICAL) ×1 IMPLANT
GAUZE SPONGE 4X4 12PLY STRL (GAUZE/BANDAGES/DRESSINGS) ×3 IMPLANT
GLOVE BIO SURGEON STRL SZ7.5 (GLOVE) ×9 IMPLANT
GLOVE INDICATOR 8.0 STRL GRN (GLOVE) ×6 IMPLANT
GOWN STRL REUS W/ TWL LRG LVL3 (GOWN DISPOSABLE) ×2 IMPLANT
GOWN STRL REUS W/TWL LRG LVL3 (GOWN DISPOSABLE) ×4
KIT RM TURNOVER STRD PROC AR (KITS) ×3 IMPLANT
LABEL OR SOLS (LABEL) IMPLANT
MESH HERNIA 10X14 SHEET (Mesh General) ×3 IMPLANT
NDL SAFETY 22GX1.5 (NEEDLE) IMPLANT
NEEDLE HYPO 25X1 1.5 SAFETY (NEEDLE) IMPLANT
NS IRRIG 500ML POUR BTL (IV SOLUTION) ×3 IMPLANT
PACK BASIN MINOR ARMC (MISCELLANEOUS) ×3 IMPLANT
PAD ABD DERMACEA PRESS 5X9 (GAUZE/BANDAGES/DRESSINGS) ×6 IMPLANT
RETAINER VISCERA MED (MISCELLANEOUS) ×3 IMPLANT
SPONGE LAP 18X18 5 PK (GAUZE/BANDAGES/DRESSINGS) ×3 IMPLANT
STAPLER SKIN PROX 35W (STAPLE) ×3 IMPLANT
STRIP CLOSURE SKIN 1/2X4 (GAUZE/BANDAGES/DRESSINGS) IMPLANT
SUT ETHILON 3-0 KS 30 BLK (SUTURE) ×3 IMPLANT
SUT ETHILON 4-0 (SUTURE) ×2
SUT ETHILON 4-0 FS2 18XMFL BLK (SUTURE) ×1
SUT MAXON 0 T 12 3 (SUTURE) ×12 IMPLANT
SUT PROLENE 0 CT 1 30 (SUTURE) ×12 IMPLANT
SUT SURGILON 0 BLK (SUTURE) IMPLANT
SUT VIC AB 2-0 BRD 54 (SUTURE) IMPLANT
SUT VIC AB 2-0 CT1 (SUTURE) ×3 IMPLANT
SUT VIC AB 2-0 CT1 27 (SUTURE) ×8
SUT VIC AB 2-0 CT1 TAPERPNT 27 (SUTURE) ×4 IMPLANT
SUT VIC AB 3-0 SH 27 (SUTURE)
SUT VIC AB 3-0 SH 27X BRD (SUTURE) IMPLANT
SUT VIC AB 4-0 FS2 27 (SUTURE) IMPLANT
SUT VICRYL+ 3-0 144IN (SUTURE) ×3 IMPLANT
SUTURE ETHLN 4-0 FS2 18XMF BLK (SUTURE) ×1 IMPLANT
SYR 3ML LL SCALE MARK (SYRINGE) IMPLANT
SYR CONTROL 10ML (SYRINGE) IMPLANT

## 2016-03-05 NOTE — H&P (Signed)
No change in clinical history or exam.  

## 2016-03-05 NOTE — Anesthesia Procedure Notes (Signed)
Procedure Name: Intubation Date/Time: 03/05/2016 10:19 AM Performed by: Junious SilkNOLES, Istvan Behar Pre-anesthesia Checklist: Patient identified, Patient being monitored, Timeout performed, Emergency Drugs available and Suction available Patient Re-evaluated:Patient Re-evaluated prior to inductionOxygen Delivery Method: Circle system utilized Preoxygenation: Pre-oxygenation with 100% oxygen Intubation Type: IV induction Ventilation: Mask ventilation without difficulty Laryngoscope Size: Mac and 3 Grade View: Grade I Tube type: Oral Tube size: 7.0 mm Number of attempts: 1 Airway Equipment and Method: Stylet Placement Confirmation: ETT inserted through vocal cords under direct vision,  positive ETCO2 and breath sounds checked- equal and bilateral Secured at: 21 cm Tube secured with: Tape Dental Injury: Teeth and Oropharynx as per pre-operative assessment

## 2016-03-05 NOTE — Op Note (Signed)
Preoperative diagnosis: Ventral hernia, previous incarceration.  Postoperative diagnosis: Same.  Operative procedure: Repair of ventral hernia with retro-rectus placement of Bard polypropylene mesh, lot number [id number], 15 x 22 cm. Lysis of adhesions.  Operating surgeon: Donnalee CurryJeffrey Stanlee Roehrig, M.D.  Anesthesia: Gen. endotracheal.  Estimated blood loss: 50 mL.  Clinical note: This 75 year old woman underwent a previous open aortobifemoral bypass. She has 2 midline hernias with a minimum diameter of 5 cm each. She recently had an episode of incarceration and responded to nasogastric drainage while visiting family in [place]. She was admitted for elective repair.  Operative note: The patient received Kefzol prior the procedure. SCD stockings were  in place for DVT prevention. the abdomen was prepped with ChloraPrep and draped. The midline incision was opened throughout its length down to the level below the umbilicus. The skin was incised sharply and the remaining dissection completed with electrocautery. The 2 primary hernia were encountered. The sac for the lower area at the level of the umbilicus was excised with cautery. The fascia was cleared and the adhesion between the omentum and the anterior abdominal wall freed to the flank bilaterally. The retrorectus space was opened with cautery and the plane between the posterior rectus sheath and the rectus muscle established bilaterally to the edge of the rectus sheath and 5 cm superiorly and inferiorly. After this had been completed on both sides, with hemostasis achieved with 3-0 Vicryl ties as indicated the posterior rectus sheath was approximated with running 2-0 Vicryl in multiple segments. The anticipated space when the midline fascia was brought together was 22 cm in length and 15 cm in width. A piece of Bard mesh was trimmed to size and transfacial sutures were placed in the midline 5 cm above and below the hernia defects. The mesh was centered on the  wound and then the right and left central portions were anchored with trans fascial stitches of 0 Maxon making use of a Revierden needle passer. Superior and inferior sutures were placed laterally as well. The mesh was smoothed in the posterior rectus space. The midline fascia was easily approximately with interrupted 0 Prolene figure-of-eight sutures. Prior to fascial closure a 19-gauge J-P drain was placed below the fascia and brought out through a separate stab wound incision on the right side of the abdomen. This was anchored in place with 3-0 nylon. The adipose layer was approximated with a running 2-0 Vicryl suture. The skin was closed with staples. The transverse fascial suture sites were closed with benzoin and Steri-Strips except for the superior lesion in the right upper quadrant which required a 4-0 nylon stitch for hemostasis. A honeycomb dressing was applied followed by AVD pad and an abdominal binder.  The patient tolerated the procedure well and was taken to recovery in stable condition.

## 2016-03-05 NOTE — Anesthesia Preprocedure Evaluation (Addendum)
Anesthesia Evaluation  Patient identified by MRN, date of birth, ID band Patient awake    Reviewed: Allergy & Precautions, NPO status , Patient's Chart, lab work & pertinent test results, reviewed documented beta blocker date and time   Airway Mallampati: II  TM Distance: >3 FB     Dental  (+) Upper Dentures, Lower Dentures   Pulmonary former smoker,           Cardiovascular hypertension, Pt. on medications and Pt. on home beta blockers + Peripheral Vascular Disease  + dysrhythmias      Neuro/Psych    GI/Hepatic   Endo/Other  Hypothyroidism   Renal/GU Renal InsufficiencyRenal disease     Musculoskeletal   Abdominal   Peds  Hematology   Anesthesia Other Findings Hx of LBBB.  Subclavian artery stenosis not requiring surgery.  Lower extremity arterial bypass. No cardiac symptoms. Stress test OK last week.  Reproductive/Obstetrics                            Anesthesia Physical Anesthesia Plan  ASA: III  Anesthesia Plan: General   Post-op Pain Management:    Induction: Intravenous  Airway Management Planned: LMA  Additional Equipment:   Intra-op Plan:   Post-operative Plan:   Informed Consent: I have reviewed the patients History and Physical, chart, labs and discussed the procedure including the risks, benefits and alternatives for the proposed anesthesia with the patient or authorized representative who has indicated his/her understanding and acceptance.     Plan Discussed with: CRNA  Anesthesia Plan Comments:         Anesthesia Quick Evaluation

## 2016-03-05 NOTE — Transfer of Care (Signed)
Immediate Anesthesia Transfer of Care Note  Patient: Janice [name]  Procedure(s) Performed: Procedure(s): HERNIA REPAIR VENTRAL ADULT (N/A) INSERTION OF MESH (N/A)  Patient Location: PACU  Anesthesia Type:General  Level of Consciousness: awake and sedated  Airway & Oxygen Therapy: Patient Spontanous Breathing and Patient connected to face mask oxygen  Post-op Assessment: Report given to RN and Post -op Vital signs reviewed and stable  Post vital signs: Reviewed and stable  Last Vitals:  Filed Vitals:   03/05/16 0837 03/05/16 1213  BP: 164/62 108/67  Pulse: 73   Temp: 36.6 C 36.3 C  Resp: 16     Complications: No apparent anesthesia complications

## 2016-03-06 NOTE — Progress Notes (Signed)
AVSS. Poor tolerance of po last night. Lungs: Clear. Cardio: RR. ABD: Soft. Dressings: Dry and intact. Drain: 200 cc since OR (serosang). Extrem: Soft. Doing well. Needs to ambulate.  Home when tolerating po well.

## 2016-03-06 NOTE — Anesthesia Postprocedure Evaluation (Signed)
Anesthesia Post Note  Patient: Janice [name]  Procedure([name]) Performed: Procedure([name]) (LRB): HERNIA REPAIR VENTRAL ADULT (N/A) INSERTION OF MESH (N/A)  Patient location during evaluation: PACU Anesthesia Type: General Level of consciousness: awake and alert Pain management: pain level controlled Vital Signs Assessment: post-procedure vital signs reviewed and stable Respiratory status: spontaneous breathing, nonlabored ventilation, respiratory function stable and patient connected to nasal cannula oxygen Cardiovascular status: blood pressure returned to baseline and stable Postop Assessment: no signs of nausea or vomiting Anesthetic complications: no    Last Vitals:  Filed Vitals:   03/06/16 0937 03/06/16 1305  BP: 136/54 140/66  Pulse: 70 66  Temp:  36.9 C  Resp:  16    Last Pain:  Filed Vitals:   03/06/16 1306  PainSc: 3                  Janice [name]

## 2016-03-07 MED ORDER — HYDROCODONE-ACETAMINOPHEN 5-325 MG PO TABS: 1.0000 | ORAL_TABLET | ORAL | Status: DC | PRN

## 2016-03-07 NOTE — Care Management Important Message (Signed)
Important Message  Patient Details  Name: Janice [name] MRN: [id number] Date of Birth: May 25, 1941   Medicare Important Message Given:  Yes    Olegario MessierKathy A Rise Traeger 03/07/2016, 11:04 AM

## 2016-03-07 NOTE — Final Progress Note (Signed)
Tolerated diet advance. Drain removed without incident.

## 2016-03-07 NOTE — Progress Notes (Signed)
AVSS.  Reports tolerating liquids well (only 120 cc recorded as input). Anxious for food. Lungs: Clear. Inspirex at 1250. Cardio: RR. ABD: Soft. Drain: 40 cc/ 24 hours. IMP: Doing well. Plan: Advance diet, anticipate d/c after lunch.

## 2016-03-07 NOTE — Plan of Care (Signed)
Problem: Safety: Goal: Ability to remain free from injury will improve Outcome: Completed/Met Date Met:  03/07/16 Pt able to make needs known, remaining free from falls this shift.  Problem: Pain Managment: Goal: General experience of comfort will improve Outcome: Completed/Met Date Met:  03/07/16 Pt with minimal pain this shift. Pain control with scheduled tylenol.  Problem: Activity: Goal: Risk for activity intolerance will decrease Outcome: Completed/Met Date Met:  03/07/16 Up and ambulating without difficulty.  Problem: Nutrition: Goal: Adequate nutrition will be maintained Outcome: Progressing No complaints of nausea or vomiting.

## 2016-03-07 NOTE — Progress Notes (Signed)
Patient discharged home with family.  All discharge instructions reviewed and discharge paperwork given to patient.  Patient verbalized understanding.  IV removed in tact. Patient friend at bedside for transfer home.

## 2016-03-15 ENCOUNTER — Encounter: Payer: Self-pay | Admitting: General Surgery

## 2016-03-15 ENCOUNTER — Ambulatory Visit (INDEPENDENT_AMBULATORY_CARE_PROVIDER_SITE_OTHER): Payer: Medicare Other | Admitting: General Surgery

## 2016-03-15 VITALS — BP 154/72 | HR 58 | Resp 12 | Ht 62.0 in | Wt 171.0 lb

## 2016-03-15 DIAGNOSIS — K439 Ventral hernia without obstruction or gangrene: Secondary | ICD-10-CM

## 2016-03-15 NOTE — Patient Instructions (Signed)
The patient is aware to call back for any questions or concerns.  

## 2016-03-15 NOTE — Progress Notes (Signed)
Patient ID: Janice [name], female   DOB: March 29, 1941, 75 y.o.   MRN: [id number]  Chief Complaint  Patient presents with  . Routine Post Op    ventral hernia    HPI Janice [name] is a 75 y.o. female here today for her post op ventral hernia repair done on 03/05/16. Patient states she is doing well. Bowels moving regular. Denies pain. She required no narcotics after discharge. She has been using her incentive spirometer and walking frequently at home. She is wearing her abdominal binder.  I personally reviewed the patient's history.  HPI  Past Medical History  Diagnosis Date  . Hyperlipidemia   . Hypertension   . Subclavian arterial stenosis (HCC)   . Thyroid disease   . Hernia, ventral   . Chronic kidney disease     Past Surgical History  Procedure Laterality Date  . Cholecystectomy    . Artery bypass rechanneling  2010  . Ventral hernia repair N/A 03/05/2016    Procedure: HERNIA REPAIR VENTRAL ADULT;  Surgeon: [name], MD;  Location: [facility] ORS;  Service: General;  Laterality: N/A;  . Insertion of mesh N/A 03/05/2016    Procedure: INSERTION OF MESH;  Surgeon: [name], MD;  Location: [facility] ORS;  Service: General;  Laterality: N/A;  . Hernia repair  03/05/2016    15 x 22 cm retrorectus Atrium mesh repair.    Family History  Problem Relation Age of Onset  . Heart disease Mother   . Hypertension Mother   . Heart disease Father   . Hypertension Father   . Hypertension Sister   . Diabetes Sister   . Cancer Neg Hx   . COPD Neg Hx   . Stroke Neg Hx     Social History Social History  Substance Use Topics  . Smoking status: Former Smoker    Types: Cigarettes    Quit date: 12/03/2008  . Smokeless tobacco: Never Used  . Alcohol Use: No    No Known Allergies  Current Outpatient Prescriptions  Medication Sig Dispense Refill  . amLODipine (NORVASC) 10 MG tablet Take 10 mg by mouth daily.  6  . aspirin 81 MG tablet Take 81 mg by mouth daily.    .  Cholecalciferol (VITAMIN D3) 1000 UNITS CAPS Take 1,000 Int'l Units by mouth daily.    Marland Kitchen levothyroxine (SYNTHROID, LEVOTHROID) 112 MCG tablet 1 TABLET DAILY ORAL 90 tablet 2  . losartan (COZAAR) 50 MG tablet Take 50 mg by mouth daily.  3  . metoprolol-hydrochlorothiazide (LOPRESSOR HCT) 100-25 MG tablet TAKE 1/2 TABLET BY MOUTH TWICE A DAY 30 tablet 2  . rosuvastatin (CRESTOR) 10 MG tablet TAKE 1 TABLET (10 MG TOTAL) BY MOUTH AT BEDTIME. 30 tablet 6   No current facility-administered medications for this visit.    Review of Systems Review of Systems  Constitutional: Negative.   Respiratory: Negative.   Cardiovascular: Negative.     Blood pressure 154/72, pulse 58, resp. rate 12, height  (1.575 m), weight 171 lb (77.565 kg).  The patient's weight is down 3 pounds from her preoperative visit weight.  Physical Exam Physical Exam  Constitutional: She is oriented to person, place, and time. She appears well-developed and well-nourished.  Cardiovascular: Normal rate, regular rhythm and normal heart sounds.   1 + bilateral lower leg edema.  Pulmonary/Chest: Effort normal and breath sounds normal.  Abdominal: Soft. Normal appearance. There is no tenderness.    Incision well healed.  Neurological: She is alert  and oriented to person, place, and time.  Skin: Skin is warm and dry.  Psychiatric: Her behavior is normal.       Assessment    Doing well status post repair of ventral hernia with 15 x 22 Atrium retrorectus mesh repair.  Mild lower extremity edema, symmetrical, asymptomatic.    Plan     Midline staples and the single cutaneous suture in the upper right quadrant at the trans-fascial incision site removed by the nurse. Benzoin and Steri-Strips applied.  Proper lifting technique reviewed. Importance of continuing ambulation discussed.  The patient can dispense with wearing her abdominal binder, but may use it on a when necessary basis for comfort.     Follow up  in one month.  PCP:  Kerman PasseyLada, Melinda P This information has been scribed by Dorathy DaftMarsha Hatch RN, BSN,BC.     Earline MayotteByrnett, Joyel Chenette W 03/15/2016, 10:02 AM

## 2016-03-17 NOTE — Discharge Summary (Signed)
Physician Discharge Summary  Patient ID: Janice [name] MRN: [id number] DOB/AGE: [birth year] 77 y.o.  Admit date: 03/05/2016 Discharge date: 03/17/2016       Admission Diagnoses: Admission diagnosis ventral hernia.  Discharge Diagnoses:    Same.  Active Problems:   * No active hospital problems. *   Discharged Condition: good  Hospital Course: Clinical note: This 75 year old woman developed a ventral hernia status post aortic repair. She was admitted by hospital and taken to the operative on 03/05/2016. At that time she had a retrorectus Bard mesh measuring 15 x 22 cm placed in the retrorectus space. The patient was admitted for postoperative pain control. The patient was started on a liquid diet and advance to a soft diet which she tolerated well.  The patient was ambulated early and often. Compressive dressing applied for the first 48 hours. The drain was able to be removed at discharge based on decreasing volumes.  At the time of discharge the patient had a clear cardiopulmonary exam, was ambulating without assistance and felt to be candidate for outpatient management.  The patient will resume all her regular medications  Consults: None  Significant Diagnostic Studies: none  Treatments: IV hydration  Discharge Exam: Blood pressure 140/62, pulse 74, temperature 97.9 F (36.6 C), temperature source Oral, resp. rate 17, height  (1.575 m), weight 171 lb (77.565 kg), SpO2 97 %.   At the time of discharge the patient had a clear cardiopulmonary exam, was ambulating without assistance and felt to be candidate for outpatient management.  The abdominal wound was healing well without evident of induration or infection.  Extremities were soft without evidence of DVT.   Disposition: 01-Home or Self Care  Discharge Instructions    Diet - low sodium heart healthy    Complete by:  As directed      Discharge instructions    Complete by:  As directed   Wear binder day and night  (except during showers). No lifting over 10 pounds. No driving until pain free.  You may shower starting tomorrow. The plastic dressing on the abdomen may be removed on Saturday. Heating pad to abdomen for comfort. Tylenol: If needed for soreness. Norco (hydrocodone): If needed for pain. This medication may constipate. Laxative of choice if needed.     Increase activity slowly    Complete by:  As directed             Medication List    TAKE these medications        amLODipine 10 MG tablet  Commonly known as:  NORVASC  Take 10 mg by mouth daily.     aspirin 81 MG tablet  Take 81 mg by mouth daily.     levothyroxine 112 MCG tablet  Commonly known as:  SYNTHROID, LEVOTHROID  1 TABLET DAILY ORAL     losartan 50 MG tablet  Commonly known as:  COZAAR  Take 50 mg by mouth daily.     metoprolol-hydrochlorothiazide 100-25 MG tablet  Commonly known as:  LOPRESSOR HCT  TAKE 1/2 TABLET BY MOUTH TWICE A DAY     rosuvastatin 10 MG tablet  Commonly known as:  CRESTOR  TAKE 1 TABLET (10 MG TOTAL) BY MOUTH AT BEDTIME.     Vitamin D3 1000 units Caps  Take 1,000 Int'l Units by mouth daily.           Follow-up Information    Follow up with Lemar Livings Merrily Pew, MD On 03/15/2016.   Specialties:  General  Surgery, Radiology   Why:  @9 :15a   Contact information:   [address] [place] [address] [phone]       Signed: Earline MayotteByrnett, Mohid Furuya W 03/17/2016, 11:09 PM

## 2016-03-27 ENCOUNTER — Other Ambulatory Visit: Payer: Self-pay

## 2016-03-27 MED ORDER — METOPROLOL-HYDROCHLOROTHIAZIDE 100-25 MG PO TABS: 0.5000 | ORAL_TABLET | Freq: Two times a day (BID) | ORAL | Status: DC

## 2016-03-27 NOTE — Telephone Encounter (Signed)
Routing to provider, she is following you to [facility]. 

## 2016-03-27 NOTE — Telephone Encounter (Signed)
Rx approved

## 2016-04-17 ENCOUNTER — Ambulatory Visit (INDEPENDENT_AMBULATORY_CARE_PROVIDER_SITE_OTHER): Payer: Medicare Other | Admitting: General Surgery

## 2016-04-17 ENCOUNTER — Encounter: Payer: Self-pay | Admitting: General Surgery

## 2016-04-17 VITALS — BP 174/84 | HR 58 | Resp 18 | Ht 62.0 in | Wt 175.0 lb

## 2016-04-17 DIAGNOSIS — K439 Ventral hernia without obstruction or gangrene: Secondary | ICD-10-CM

## 2016-04-17 NOTE — Patient Instructions (Addendum)
The patient is aware to call back for any questions or concerns. Resume activities as tolerated. Proper lifting techniques reviewed 

## 2016-04-17 NOTE — Progress Notes (Signed)
Patient ID: Janice ChesterfieldLinda L Odden, female   DOB: [birth year], 75 y.o.   MRN: [id number]  Chief Complaint  Patient presents with  . Routine Post Op    HPI Janice [name] is a 75 y.o. female.  Here today for postoperative visit, hernia repair on 03-05-16. She states she is doing well. Minimal to no pain.  [place]. I personally reviewed the patient's history.  HPI  Past Medical History  Diagnosis Date  . Hyperlipidemia   . Hypertension   . Subclavian arterial stenosis (HCC)   . Thyroid disease   . Hernia, ventral   . Chronic kidney disease     Past Surgical History  Procedure Laterality Date  . Cholecystectomy    . Artery bypass rechanneling  2010  . Ventral hernia repair N/A 03/05/2016    Procedure: HERNIA REPAIR VENTRAL ADULT;  Surgeon: Earline MayotteJeffrey W Sharifah Champine, MD;  Location: [facility];  Service: General;  Laterality: N/A;  . Insertion of mesh N/A 03/05/2016    Procedure: INSERTION OF MESH;  Surgeon: Earline MayotteJeffrey W Yuki Purves, MD;  Location: [facility];  Service: General;  Laterality: N/A;  . Hernia repair  03/05/2016    15 x 22 cm retrorectus At[facility] mesh repair.    Family History  Problem Relation Age of Onset  . Heart disease Mother   . Hypertension Mother   . Heart disease Father   . Hypertension Father   . Hypertension Sister   . Diabetes Sister   . Cancer Neg Hx   . COPD Neg Hx   . Stroke Neg Hx     Social History Social History  Substance Use Topics  . Smoking status: Former Smoker    Types: Cigarettes    Quit date: 12/03/2008  . Smokeless tobacco: Never Used  . Alcohol Use: No    No Known Allergies  Current Outpatient Prescriptions  Medication Sig Dispense Refill  . amLODipine (NORVASC) 10 MG tablet Take 10 mg by mouth daily.  6  . aspirin 81 MG tablet Take 81 mg by mouth daily.    . Cholecalciferol (VITAMIN D3) 1000 UNITS CAPS Take 1,000 Int'l Units by mouth daily.    Marland Kitchen. levothyroxine (SYNTHROID, LEVOTHROID) 112 MCG tablet 1 TABLET DAILY ORAL  90 tablet 2  . losartan (COZAAR) 50 MG tablet Take 50 mg by mouth daily.  3  . metoprolol-hydrochlorothiazide (LOPRESSOR HCT) 100-25 MG tablet Take 0.5 tablets by mouth 2 (two) times daily. 30 tablet 5  . rosuvastatin (CRESTOR) 10 MG tablet TAKE 1 TABLET (10 MG TOTAL) BY MOUTH AT BEDTIME. 30 tablet 6   No current facility-administered medications for this visit.    Review of Systems Review of Systems  Constitutional: Negative.   Respiratory: Negative.   Cardiovascular: Negative.   Gastrointestinal: Negative for nausea, vomiting, diarrhea and constipation.    Blood pressure 174/84, pulse 58, resp. rate 18, height 5\' 2"  (1.575 m), weight 175 lb (79.379 kg).  Physical Exam Physical Exam  Constitutional: She is oriented to person, place, and time. She appears well-developed and well-nourished.  Pulmonary/Chest:  Repair intact.  Abdominal: Soft. There is no tenderness.  Neurological: She is alert and oriented to person, place, and time.  Skin: Skin is warm and dry.  Psychiatric: Her behavior is normal.      Assessment    Doing well status post retrorectus ventral hernia repair with prosthetic mesh.    Plan         Follow up December 2017  for final exam.   Resume activities as tolerated. Proper lifting techniques reviewed.   PCP:  Kerman Passey This information has been scribed by Dorathy Daft RN, BSN,BC.   [name] 04/18/2016, 8:01 PM

## 2016-05-07 DIAGNOSIS — N2581 Secondary hyperparathyroidism of renal origin: Secondary | ICD-10-CM | POA: Diagnosis not present

## 2016-05-07 DIAGNOSIS — R809 Proteinuria, unspecified: Secondary | ICD-10-CM | POA: Diagnosis not present

## 2016-05-07 DIAGNOSIS — N183 Chronic kidney disease, stage 3 (moderate): Secondary | ICD-10-CM | POA: Diagnosis not present

## 2016-05-07 DIAGNOSIS — I129 Hypertensive chronic kidney disease with stage 1 through stage 4 chronic kidney disease, or unspecified chronic kidney disease: Secondary | ICD-10-CM | POA: Diagnosis not present

## 2016-05-17 ENCOUNTER — Ambulatory Visit: Payer: Medicare Other | Admitting: Family Medicine

## 2016-05-18 ENCOUNTER — Other Ambulatory Visit: Payer: Self-pay

## 2016-05-18 MED ORDER — ROSUVASTATIN CALCIUM 10 MG PO TABS: 10.0000 mg | ORAL_TABLET | Freq: Every day | ORAL | Status: DC

## 2016-05-18 NOTE — Telephone Encounter (Signed)
She has an appt on 05/22/16 Last sgpt reviewed; Rx approved

## 2016-05-22 ENCOUNTER — Other Ambulatory Visit: Payer: Self-pay

## 2016-05-22 ENCOUNTER — Ambulatory Visit (INDEPENDENT_AMBULATORY_CARE_PROVIDER_SITE_OTHER): Payer: Medicare Other | Admitting: Family Medicine

## 2016-05-22 ENCOUNTER — Encounter: Payer: Self-pay | Admitting: Family Medicine

## 2016-05-22 VITALS — BP 140/62 | HR 50 | Temp 98.5°F | Resp 14 | Wt 174.0 lb

## 2016-05-22 DIAGNOSIS — R7301 Impaired fasting glucose: Secondary | ICD-10-CM | POA: Diagnosis not present

## 2016-05-22 DIAGNOSIS — E669 Obesity, unspecified: Secondary | ICD-10-CM

## 2016-05-22 DIAGNOSIS — Z5181 Encounter for therapeutic drug level monitoring: Secondary | ICD-10-CM

## 2016-05-22 DIAGNOSIS — E7800 Pure hypercholesterolemia, unspecified: Secondary | ICD-10-CM

## 2016-05-22 DIAGNOSIS — I10 Essential (primary) hypertension: Secondary | ICD-10-CM

## 2016-05-22 DIAGNOSIS — E039 Hypothyroidism, unspecified: Secondary | ICD-10-CM | POA: Diagnosis not present

## 2016-05-22 LAB — HEPATIC FUNCTION PANEL
ALT: 9 U/L (ref 6–29)
AST: 13 U/L (ref 10–35)
Albumin: 4.3 g/dL (ref 3.6–5.1)
Alkaline Phosphatase: 75 U/L (ref 33–130)
BILIRUBIN DIRECT: 0.1 mg/dL (ref <=0.2)
BILIRUBIN INDIRECT: 0.2 mg/dL (ref 0.2–1.2)
BILIRUBIN TOTAL: 0.3 mg/dL (ref 0.2–1.2)
Total Protein: 7.4 g/dL (ref 6.1–8.1)

## 2016-05-22 LAB — LIPID PANEL
CHOL/HDL RATIO: 2.7 ratio (ref <=5.0)
Cholesterol: 138 mg/dL (ref 125–200)
HDL: 51 mg/dL (ref >=46)
LDL CALC: 62 mg/dL (ref <130)
TRIGLYCERIDES: 123 mg/dL (ref <150)
VLDL: 25 mg/dL (ref <30)

## 2016-05-22 LAB — HEMOGLOBIN A1C
Hgb A1c MFr Bld: 5.8 % — ABNORMAL HIGH (ref <5.7)
Mean Plasma Glucose: 120 mg/dL

## 2016-05-22 MED ORDER — METOPROLOL TARTRATE 25 MG PO TABS: 25.0000 mg | ORAL_TABLET | Freq: Two times a day (BID) | ORAL | Status: DC

## 2016-05-22 MED ORDER — HYDROCHLOROTHIAZIDE 25 MG PO TABS: 25.0000 mg | ORAL_TABLET | Freq: Every day | ORAL | Status: DC

## 2016-05-22 MED ORDER — LOSARTAN POTASSIUM 50 MG PO TABS: 50.0000 mg | ORAL_TABLET | Freq: Every day | ORAL | Status: DC

## 2016-05-22 MED ORDER — ROSUVASTATIN CALCIUM 10 MG PO TABS: 10.0000 mg | ORAL_TABLET | Freq: Every day | ORAL | Status: DC

## 2016-05-22 MED ORDER — AMLODIPINE BESYLATE 10 MG PO TABS: 10.0000 mg | ORAL_TABLET | Freq: Every day | ORAL | Status: DC

## 2016-05-22 NOTE — Assessment & Plan Note (Signed)
Work on weight loss.

## 2016-05-22 NOTE — Patient Instructions (Addendum)
STOP the combination metoprolol/hctz pill START the plain HCTZ START plain metoprolol 25 mg twice a day Check your blood pressure daily over the next 3-4 weeks and call me if top number trending up over 140 We can increase the losartan from 50 mg to 100 mg if needed, but talk to me or Dr. Wynelle Link first We'll want to monitor your kidneys if we do make that increase  Your goal blood pressure is less than 150 mmHg on top. Try to follow the DASH guidelines (DASH stands for Dietary Approaches to Stop Hypertension) Try to limit the sodium in your diet.  Ideally, consume less than 1.5 grams (less than 1,500mg ) per day. Do not add salt when cooking or at the table.  Check the sodium amount on labels when shopping, and choose items lower in sodium when given a choice. Avoid or limit foods that already contain a lot of sodium. Eat a diet rich in fruits and vegetables and whole grains.  Check out the information at familydoctor.org entitled "Nutrition for Weight Loss: What You Need to Know about Fad Diets" Try to lose between 1-2 pounds per week by taking in fewer calories and burning off more calories You can succeed by limiting portions, limiting foods dense in calories and fat, becoming more active, and drinking 8 glasses of water a day (64 ounces) Don't skip meals, especially breakfast, as skipping meals may alter your metabolism Do not use over-the-counter weight loss pills or gimmicks that claim rapid weight loss A healthy BMI (or body mass index) is between 18.5 and 24.9 You can calculate your ideal BMI at the NIH website JobE[employer]ics.hu  We'll get labs today  DASH Eating Plan DASH stands for "Dietary Approaches to Stop Hypertension." The DASH eating plan is a healthy eating plan that has been shown to reduce high blood pressure (hypertension). Additional health benefits may include reducing the risk of type 2 diabetes mellitus, heart disease,  and stroke. The DASH eating plan may also help with weight loss. WHAT DO I NEED TO KNOW ABOUT THE DASH EATING PLAN? For the DASH eating plan, you will follow these general guidelines:  Choose foods with a percent daily value for sodium of less than 5% (as listed on the food label).  Use salt-free seasonings or herbs instead of table salt or sea salt.  Check with your health care provider or pharmacist before using salt substitutes.  Eat lower-sodium products, often labeled as "lower sodium" or "no salt added."  Eat fresh foods.  Eat more vegetables, fruits, and low-fat dairy products.  Choose whole grains. Look for the word "whole" as the first word in the ingredient list.  Choose fish and skinless chicken or [place] more often than red meat. Limit fish, poultry, and meat to 6 oz (170 g) each day.  Limit sweets, desserts, sugars, and sugary drinks.  Choose heart-healthy fats.  Limit cheese to 1 oz (28 g) per day.  Eat more home-cooked food and less restaurant, buffet, and fast food.  Limit fried foods.  Cook foods using methods other than frying.  Limit canned vegetables. If you do use them, rinse them well to decrease the sodium.  When eating at a restaurant, ask that your food be prepared with less salt, or no salt if possible. WHAT FOODS CAN I EAT? Seek help from a dietitian for individual calorie needs. Grains Whole grain or whole wheat bread. Brown rice. Whole grain or whole wheat pasta. Quinoa, bulgur, and whole grain cereals. Low-sodium cereals. Corn  or whole wheat flour tortillas. Whole grain cornbread. Whole grain crackers. Low-sodium crackers. Vegetables Fresh or frozen vegetables (raw, steamed, roasted, or grilled). Low-sodium or reduced-sodium tomato and vegetable juices. Low-sodium or reduced-sodium tomato sauce and paste. Low-sodium or reduced-sodium canned vegetables.  Fruits All fresh, canned (in natural juice), or frozen fruits. Meat and Other Protein  Products Ground beef (85% or leaner), grass-fed beef, or beef trimmed of fat. Skinless chicken or [place]. Ground chicken or [place]. Pork trimmed of fat. All fish and seafood. Eggs. Dried beans, peas, or lentils. Unsalted nuts and seeds. Unsalted canned beans. Dairy Low-fat dairy products, such as skim or 1% milk, 2% or reduced-fat cheeses, low-fat ricotta or cottage cheese, or plain low-fat yogurt. Low-sodium or reduced-sodium cheeses. Fats and Oils Tub margarines without trans fats. Light or reduced-fat mayonnaise and salad dressings (reduced sodium). Avocado. Safflower, olive, or canola oils. Natural peanut or almond butter. Other Unsalted popcorn and pretzels. The items listed above may not be a complete list of recommended foods or beverages. Contact your dietitian for more options. WHAT FOODS ARE NOT RECOMMENDED? Grains White bread. White pasta. White rice. Refined cornbread. Bagels and croissants. Crackers that contain trans fat. Vegetables Creamed or fried vegetables. Vegetables in a cheese sauce. Regular canned vegetables. Regular canned tomato sauce and paste. Regular tomato and vegetable juices. Fruits Dried fruits. Canned fruit in light or heavy syrup. Fruit juice. Meat and Other Protein Products Fatty cuts of meat. Ribs, chicken wings, bacon, sausage, bologna, salami, chitterlings, fatback, hot dogs, bratwurst, and packaged luncheon meats. Salted nuts and seeds. Canned beans with salt. Dairy Whole or 2% milk, cream, half-and-half, and cream cheese. Whole-fat or sweetened yogurt. Full-fat cheeses or blue cheese. Nondairy creamers and whipped toppings. Processed cheese, cheese spreads, or cheese curds. Condiments Onion and garlic salt, seasoned salt, table salt, and sea salt. Canned and packaged gravies. Worcestershire sauce. Tartar sauce. Barbecue sauce. Teriyaki sauce. Soy sauce, including reduced sodium. Steak sauce. Fish sauce. Oyster sauce. Cocktail sauce. Horseradish. Ketchup and  mustard. Meat flavorings and tenderizers. Bouillon cubes. Hot sauce. Tabasco sauce. Marinades. Taco seasonings. Relishes. Fats and Oils Butter, stick margarine, lard, shortening, ghee, and bacon fat. Coconut, palm kernel, or palm oils. Regular salad dressings. Other Pickles and olives. Salted popcorn and pretzels. The items listed above may not be a complete list of foods and beverages to avoid. Contact your dietitian for more information. WHERE CAN I FIND MORE INFORMATION? National Heart, Lung, and Blood Institute: CablePromo.itwww.nhlbi.nih.gov/health/health-topics/topics/dash/   This information is not intended to replace advice given to you by your health care provider. Make sure you discuss any questions you have with your health care provider.   Document Released: 11/08/2011 Document Revised: 12/10/2014 Document Reviewed: 09/23/2013 Elsevier Interactive Patient Education [employer]2016 Elsevier Inc.

## 2016-05-22 NOTE — Assessment & Plan Note (Signed)
Well-controlled today; however, heart rate only 50, so I'm reducing the beta-blocker; patient to monitor BP and work on weight loss and DASH guidelines; if systolic creeping up, then we'll increase the ARB

## 2016-05-22 NOTE — Assessment & Plan Note (Signed)
Check TSH; adjust if needed 

## 2016-05-22 NOTE — Assessment & Plan Note (Signed)
Continue Crestor; check lipids today and sgpt; work on weight and decrease saturated fats

## 2016-05-22 NOTE — Assessment & Plan Note (Signed)
Check A1c today; work on weight loss

## 2016-05-22 NOTE — Progress Notes (Signed)
BP 140/62 mmHg  Pulse 50  Temp(Src) 98.5 F (36.9 C) (Oral)  Resp 14  Wt 174 lb (78.926 kg)  SpO2 98%   Subjective:    Patient ID: Janice [name], female    DOB: [birth year], 75 y.o.   MRN: [id number]  HPI: Janice [name] is a 75 y.o. female  Chief Complaint  Patient presents with  . Follow-up    6 months   Patient had ventral surgery repair; did well, no fevers, no abd pain  HTN; taking losartan, beta-blocker, thiazide, CCB; can check BP at home  She has high cholesterol; on crestor; no terrible muscle aches; eats some cheese, not daily; no eggs; no milk; no processed meats  Dr. Wynelle Link did labs and checked urine; checked kidney function; in the 50's  Going on a road trip for a while; gone until December  Hypothyroidism; energy level is fair; no constipation  Prediabetes; not much white bread or rice; not much dry mouth; younger sister has diabetes  Depression screen [facility] 2/9 05/22/2016 11/21/2015  Decreased Interest 0 0  Down, Depressed, Hopeless 0 0  PHQ - 2 Score 0 0   Relevant past medical, surgical, family and social history reviewed Past Medical History  Diagnosis Date  . Hyperlipidemia   . Hypertension   . Subclavian arterial stenosis (HCC)   . Thyroid disease   . Hernia, ventral   . Chronic kidney disease    Past Surgical History  Procedure Laterality Date  . Cholecystectomy    . Artery bypass rechanneling  2010  . Ventral hernia repair N/A 03/05/2016    Procedure: HERNIA REPAIR VENTRAL ADULT;  Surgeon: [name], MD;  Location: [facility] ORS;  Service: General;  Laterality: N/A;  . Insertion of mesh N/A 03/05/2016    Procedure: INSERTION OF MESH;  Surgeon: [name], MD;  Location: [facility] ORS;  Service: General;  Laterality: N/A;  . Hernia repair  03/05/2016    15 x 22 cm retrorectus [facility] mesh repair.   Family History  Problem Relation Age of Onset  . Heart disease Mother   . Hypertension Mother   . Heart disease Father   . Hypertension  Father   . Hypertension Sister   . Diabetes Sister   . Cancer Neg Hx   . COPD Neg Hx   . Stroke Neg Hx    Social History  Substance Use Topics  . Smoking status: Former Smoker    Types: Cigarettes    Quit date: 12/03/2008  . Smokeless tobacco: Never Used  . Alcohol Use: 0.0 oz/week    0 Standard drinks or equivalent per week     Comment: wine occ.    Interim medical history since last visit reviewed. Allergies and medications reviewed  Review of Systems  Respiratory: Negative for wheezing.   Cardiovascular: Negative for chest pain and leg swelling.  Per HPI unless specifically indicated above     Objective:    BP 140/62 mmHg  Pulse 50  Temp(Src) 98.5 F (36.9 C) (Oral)  Resp 14  Wt 174 lb (78.926 kg)  SpO2 98%  Wt Readings from Last 3 Encounters:  05/22/16 174 lb (78.926 kg)  04/17/16 175 lb (79.379 kg)  03/15/16 171 lb (77.565 kg)   body mass index is 31.82 kg/(m^2).  Physical Exam  Constitutional: She appears well-developed and well-nourished. No distress.  HENT:  Head: Normocephalic and atraumatic.  Eyes: EOM are normal. No scleral icterus.  Neck: No thyromegaly present.  Cardiovascular:  Normal rate, regular rhythm and normal heart sounds.   No murmur heard. Pulmonary/Chest: Effort normal and breath sounds normal.  Abdominal: Soft. Bowel sounds are normal. She exhibits no distension.  Musculoskeletal: Normal range of motion. She exhibits no edema.  Neurological: She is alert. She exhibits normal muscle tone.  Skin: Skin is warm and dry. She is not diaphoretic. No pallor.  Psychiatric: She has a normal mood and affect. Her behavior is normal. Judgment and thought content normal.   Results for orders placed or performed during the hospital encounter of 03/05/16  CBC  Result Value Ref Range   WBC 16.2 (H) 3.6 - 11.0 K/uL   RBC 3.85 3.80 - 5.20 MIL/uL   Hemoglobin 11.0 (L) 12.0 - 16.0 g/dL   HCT 16.133.1 (L) 09.635.0 - 04.547.0 %   MCV 85.9 80.0 - 100.0 fL   MCH  28.6 26.0 - 34.0 pg   MCHC 33.3 32.0 - 36.0 g/dL   RDW 40.913.8 81.111.5 - 91.414.5 %   Platelets 217 150 - 440 K/uL  Creatinine, serum  Result Value Ref Range   Creatinine, Ser 1.03 (H) 0.44 - 1.00 mg/dL   GFR calc non Af Amer 51 (L) >60 mL/min   GFR calc Af Amer 60 (L) >60 mL/min      Assessment & Plan:   Problem List Items Addressed This Visit      Cardiovascular and Mediastinum   Benign hypertension - Primary    Well-controlled today; however, heart rate only 50, so I'm reducing the beta-blocker; patient to monitor BP and work on weight loss and DASH guidelines; if systolic creeping up, then we'll increase the ARB      Relevant Medications   hydrochlorothiazide (HYDRODIURIL) 25 MG tablet   metoprolol tartrate (LOPRESSOR) 25 MG tablet   losartan (COZAAR) 50 MG tablet   amLODipine (NORVASC) 10 MG tablet   rosuvastatin (CRESTOR) 10 MG tablet     Endocrine   Hypothyroidism    Check TSH; adjust if needed      Relevant Medications   metoprolol tartrate (LOPRESSOR) 25 MG tablet   IFG (impaired fasting glucose)    Check A1c today; work on weight loss      Relevant Orders   Hemoglobin A1C     Other   Hypercholesteremia    Continue Crestor; check lipids today and sgpt; work on weight and decrease saturated fats      Relevant Medications   hydrochlorothiazide (HYDRODIURIL) 25 MG tablet   metoprolol tartrate (LOPRESSOR) 25 MG tablet   losartan (COZAAR) 50 MG tablet   amLODipine (NORVASC) 10 MG tablet   rosuvastatin (CRESTOR) 10 MG tablet   Other Relevant Orders   Lipid panel   Medication monitoring encounter    Check sgpt today      Relevant Orders   Hepatic function panel   Obesity    Work on weight loss         Follow up plan: Return in about 6 months (around 11/21/2016) for fasting labs and visit.  An after-visit summary was printed and given to the patient at check-out.  Please see the patient instructions which may contain other information and recommendations  beyond what is mentioned above in the assessment and plan.  Meds ordered this encounter  Medications  . hydrochlorothiazide (HYDRODIURIL) 25 MG tablet    Sig: Take 1 tablet (25 mg total) by mouth daily.    Dispense:  90 tablet    Refill:  3    STOP the  combination hctz with metoprolol  . metoprolol tartrate (LOPRESSOR) 25 MG tablet    Sig: Take 1 tablet (25 mg total) by mouth 2 (two) times daily.    Dispense:  180 tablet    Refill:  3    STOP the combination metoprolol/hctz; I'm splitting them up  . losartan (COZAAR) 50 MG tablet    Sig: Take 1 tablet (50 mg total) by mouth daily.    Dispense:  90 tablet    Refill:  3  . amLODipine (NORVASC) 10 MG tablet    Sig: Take 1 tablet (10 mg total) by mouth daily.    Dispense:  90 tablet    Refill:  3  . rosuvastatin (CRESTOR) 10 MG tablet    Sig: Take 1 tablet (10 mg total) by mouth at bedtime.    Dispense:  90 tablet    Refill:  1   Orders Placed This Encounter  Procedures  . Hepatic function panel  . Lipid panel  . Hemoglobin A1C

## 2016-05-22 NOTE — Assessment & Plan Note (Signed)
Check sgpt today 

## 2016-05-23 ENCOUNTER — Ambulatory Visit: Payer: Medicare Other | Admitting: Family Medicine

## 2016-10-11 ENCOUNTER — Ambulatory Visit: Payer: Medicare Other | Admitting: Cardiology

## 2016-10-17 ENCOUNTER — Ambulatory Visit (INDEPENDENT_AMBULATORY_CARE_PROVIDER_SITE_OTHER): Payer: Medicare Other | Admitting: Cardiology

## 2016-10-17 ENCOUNTER — Encounter: Payer: Self-pay | Admitting: Cardiology

## 2016-10-17 VITALS — BP 150/96 | HR 58 | Ht 64.0 in | Wt 177.8 lb

## 2016-10-17 DIAGNOSIS — Z23 Encounter for immunization: Secondary | ICD-10-CM

## 2016-10-17 DIAGNOSIS — E785 Hyperlipidemia, unspecified: Secondary | ICD-10-CM | POA: Diagnosis not present

## 2016-10-17 DIAGNOSIS — I447 Left bundle-branch block, unspecified: Secondary | ICD-10-CM | POA: Diagnosis not present

## 2016-10-17 DIAGNOSIS — I10 Essential (primary) hypertension: Secondary | ICD-10-CM | POA: Diagnosis not present

## 2016-10-17 NOTE — Patient Instructions (Signed)
Follow-Up: Your physician recommends that you schedule a follow-up appointment as needed with Dr. Ingal.   It was a pleasure seeing you today here in the office. Please do not hesitate to give us a call back if you have any further questions. [phone]  Adajah Cocking A. RN, BSN     

## 2016-10-17 NOTE — Progress Notes (Signed)
Cardiology Office Note   Date:  10/17/2016   ID:  Janice [name], DOB 1940/12/16, MRN [id number]  Referring Doctor:  Baruch Gouty, MD   Cardiologist:   Almond Lint, MD   Reason for consultation:  Chief Complaint  Patient presents with  . other    5 month fu for left bundle branch block and preop cardiac evaluation. Pt states she is doing well. Reviewed meds with pt verbally.       History of Present Illness: Janice [name] is a 75 y.o. female who presents for follow-up Post ventral hernia surgery, history of hypertension  Patient does not report any chest pain, shortness of breath. No palpitations, PND, orthopnea, edema. She reports doing well after ventral hernia surgery. She reports dietary indiscretion comes to sodium intake. Also, not able to exercise routinely.  Regarding her hypertension, she reports that her blood pressure normally is in the 130s to 140s systolic. She says that the nephrologist is the one managing her antihypertensive medication.   ROS:  Please see the history of present illness. Aside from mentioned under HPI, all other systems are reviewed and negative.     Past Medical History:  Diagnosis Date  . Chronic kidney disease   . Hernia, ventral   . Hyperlipidemia   . Hypertension   . Subclavian arterial stenosis (HCC)   . Thyroid disease    Peripheral vascular disease  Past Surgical History:  Procedure Laterality Date  . artery bypass rechanneling  2010  . CHOLECYSTECTOMY    . HERNIA REPAIR  03/05/2016   15 x 22 cm retrorectus Atrium mesh repair.  . INSERTION OF MESH N/A 03/05/2016   Procedure: INSERTION OF MESH;  Surgeon: [name], MD;  Location: [facility] ORS;  Service: General;  Laterality: N/A;  . VENTRAL HERNIA REPAIR N/A 03/05/2016   Procedure: HERNIA REPAIR VENTRAL ADULT;  Surgeon: [name], MD;  Location: [facility] ORS;  Service: General;  Laterality: N/A;     reports that she quit smoking about 7 years ago. Her smoking use  included Cigarettes. She has never used smokeless tobacco. She reports that she does not drink alcohol or use drugs.   family history includes Diabetes in her sister; Heart disease in her father and mother; Hypertension in her father, mother, and sister.   Current Outpatient Prescriptions  Medication Sig Dispense Refill  . amLODipine (NORVASC) 10 MG tablet Take 1 tablet (10 mg total) by mouth daily. 90 tablet 3  . aspirin 81 MG tablet Take 81 mg by mouth daily.    . Cholecalciferol (VITAMIN D3) 1000 UNITS CAPS Take 1,000 Int'l Units by mouth daily.    . hydrochlorothiazide (HYDRODIURIL) 25 MG tablet Take 1 tablet (25 mg total) by mouth daily. 90 tablet 3  . levothyroxine (SYNTHROID, LEVOTHROID) 112 MCG tablet 1 TABLET DAILY ORAL 90 tablet 2  . losartan (COZAAR) 50 MG tablet Take 1 tablet (50 mg total) by mouth daily. 90 tablet 3  . metoprolol tartrate (LOPRESSOR) 25 MG tablet Take 1 tablet (25 mg total) by mouth 2 (two) times daily. 180 tablet 3  . rosuvastatin (CRESTOR) 10 MG tablet Take 1 tablet (10 mg total) by mouth at bedtime. 90 tablet 1   No current facility-administered medications for this visit.     Allergies: Patient has no known allergies.    PHYSICAL EXAM: VS:  BP (!) 150/96 (BP Location: Right Arm, Patient Position: Sitting, Cuff Size: Normal)   Pulse (!) 58   Ht  5\' 4"  (1.626 m)   Wt 177 lb 12 oz (80.6 kg)   BMI 30.51 kg/m  , Body mass index is 30.51 kg/m. Wt Readings from Last 3 Encounters:  10/17/16 177 lb 12 oz (80.6 kg)  05/22/16 174 lb (78.9 kg)  04/17/16 175 lb (79.4 kg)    GENERAL:  well developed, well nourished, obese, not in acute distress HEENT: normocephalic, pink conjunctivae, anicteric sclerae, no xanthelasma, normal dentition, oropharynx clear NECK:  no neck vein engorgement, JVP normal, no hepatojugular reflux, carotid upstroke brisk and symmetric, no bruit, no thyromegaly, no lymphadenopathy LUNGS:  good respiratory effort, clear to auscultation  bilaterally CV:  PMI not displaced, no thrills, no lifts, S1 and S2 within normal limits, no palpable S3 or S4, no murmurs, no rubs, no gallops ABD:  Soft, nontender, nondistended, normoactive bowel sounds, no abdominal aortic bruit, no hepatomegaly, no splenomegaly MS: nontender back, no kyphosis, no scoliosis, no joint deformities EXT:  2+ DP/PT pulses, no edema, no varicosities, no cyanosis, no clubbing SKIN: warm, nondiaphoretic, normal turgor, no ulcers NEUROPSYCH: alert, oriented to person, place, and time, sensory/motor grossly intact, normal mood, appropriate affect  Recent Labs: 11/21/2015: TSH 3.460 01/17/2016: BUN 22; Potassium 4.0; Sodium 140 03/05/2016: Creatinine, Ser 1.03; Hemoglobin 11.0; Platelets 217 05/22/2016: ALT 9   Lipid Panel 11/21/2015    Component Value Date/Time   CHOL 138 05/22/2016 1044   CHOL 147 11/21/2015 1001   TRIG 123 05/22/2016 1044   HDL 51 05/22/2016 1044   HDL 51 11/21/2015 1001   CHOLHDL 2.7 05/22/2016 1044   VLDL 25 05/22/2016 1044   LDLCALC 62 05/22/2016 1044   LDLCALC 71 11/21/2015 1001     Other studies Reviewed:  EKG:   The ekg ordered 02/01/2016 was personally reviewed by me and it reveals sinus bradycardia, 55 BPM. Left bundle branch block.  Additional studies/ records that were reviewed personally reviewed by me today include:  Echocardiogram 02/14/2016: Left ventricle: The cavity size was normal. Systolic function was  normal. The estimated ejection fraction was in the range of 55%  to 65%. Wall motion was normal; there were no regional wall  motion abnormalities. Left ventricular diastolic function  parameters were normal. - Mitral valve: There was mild regurgitation. - Left atrium: The atrium was normal in size. - Right ventricle: Systolic function was normal.   Pulmonary arteries: Systolic pressure was mildly elevated. PA  peak pressure: 38 mm Hg (S).  Pharmacologic nuclear stress test 02/10/2016: Pharmacological  myocardial perfusion imaging study with no significant ischemia Normal wall motion, EF estimated at 72% No EKG changes concerning for ischemia at peak stress or in recovery. Resting EKG with left bundle branch block Low risk scan   ASSESSMENT AND PLAN:  Left bundle branch block  hypertension  hyperlipidemia Obesity  In terms of her hypertension, blood pressure is minimally elevated today. According to the patient, it is much better at home. It usually is in the 130s to 140s systolic. Per patient, Nephrology is managing her antihypertensive regimen. Recommend a blood pressure log. Discussed in detail importance of sodium restriction in her diet as well as other lifestyle changes.  In terms of hyperlipidemia, because of a history of peripheral vascular disease recommended LDL goal is less than 70. Continue Crestor for now. PCP following labs.  In terms of obesity, Body mass index is 30.51 kg/m.Marland Kitchen. Recommend aggressive weight loss through diet and increased physical activity.   Current medicines are reviewed at length with the patient today.  The  patient does not have concerns regarding medicines.  Labs/ tests ordered today include:  Orders Placed This Encounter  Procedures  . Flu Vaccine QUAD 36+ mos IM    I had a lengthy and detailed discussion with the patient regarding diagnoses, prognosis, diagnostic options, treatment options.   I counseled the patient on importance of lifestyle modification including heart healthy diet, regular physical activity.  Disposition:   FU with undersigned prn  Signed, Almond LintAileen Taelon Bendorf, MD  10/17/2016 2:02 PM    [facility]

## 2016-11-01 ENCOUNTER — Encounter: Payer: Self-pay | Admitting: *Deleted

## 2016-11-07 ENCOUNTER — Ambulatory Visit (INDEPENDENT_AMBULATORY_CARE_PROVIDER_SITE_OTHER): Payer: Medicare Other | Admitting: General Surgery

## 2016-11-07 ENCOUNTER — Encounter: Payer: Self-pay | Admitting: General Surgery

## 2016-11-07 VITALS — BP 148/72 | HR 60 | Resp 18 | Ht 64.0 in | Wt 178.0 lb

## 2016-11-07 DIAGNOSIS — K439 Ventral hernia without obstruction or gangrene: Secondary | ICD-10-CM

## 2016-11-07 NOTE — Progress Notes (Signed)
Patient ID: Janice ChesterfieldLinda L [name], female   DOB: Jan 05, 1941, 75 y.o.   MRN: [id number]  Chief Complaint  Patient presents with  . Follow-up    HPI Janice [name] is a 75 y.o. female.  Here today for follow up from ventral hernia surgery completed in April. She states she is doing well. Denies any gastrointestinal issues.   HPI  Past Medical History:  Diagnosis Date  . Chronic kidney disease   . Hernia, ventral   . Hyperlipidemia   . Hypertension   . Subclavian arterial stenosis (HCC)   . Thyroid disease     Past Surgical History:  Procedure Laterality Date  . artery bypass rechanneling  2010  . CHOLECYSTECTOMY    . HERNIA REPAIR  03/05/2016   15 x 22 cm retrorectus Atrium mesh repair.  . INSERTION OF MESH N/A 03/05/2016   Procedure: INSERTION OF MESH;  Surgeon: Earline MayotteJeffrey W Randi College, MD;  Location: [facility] ORS;  Service: General;  Laterality: N/A;  . VENTRAL HERNIA REPAIR N/A 03/05/2016   Procedure: HERNIA REPAIR VENTRAL ADULT;  Surgeon: Earline MayotteJeffrey W Thailyn Khalid, MD;  Location: [facility] OR[facility];  Service: General;  Laterality: N/A;    Family History  Problem Relation Age of Onset  . Heart disease Mother   . Hypertension Mother   . Heart disease Father   . Hypertension Father   . Hypertension Sister   . Diabetes Sister   . Cancer Neg Hx   . COPD Neg Hx   . Stroke Neg Hx     Social History Social History  Substance Use Topics  . Smoking status: Former Smoker    Types: Cigarettes    Quit date: 12/03/2008  . Smokeless tobacco: Never Used  . Alcohol use No    No Known Allergies  Current Outpatient Prescriptions  Medication Sig Dispense Refill  . amLODipine (NORVASC) 10 MG tablet Take 1 tablet (10 mg total) by mouth daily. 90 tablet 3  . aspirin 81 MG tablet Take 81 mg by mouth daily.    . Cholecalciferol (VITAMIN D3) 1000 UNITS CAPS Take 1,000 Int'l Units by mouth daily.    . hydrochlorothiazide (HYDRODIURIL) 25 MG tablet Take 1 tablet (25 mg total) by mouth daily. 90 tablet 3  . levothyroxine  (SYNTHROID, LEVOTHROID) 112 MCG tablet 1 TABLET DAILY ORAL 90 tablet 2  . losartan (COZAAR) 50 MG tablet Take 1 tablet (50 mg total) by mouth daily. 90 tablet 3  . metoprolol tartrate (LOPRESSOR) 25 MG tablet Take 1 tablet (25 mg total) by mouth 2 (two) times daily. 180 tablet 3  . rosuvastatin (CRESTOR) 10 MG tablet Take 1 tablet (10 mg total) by mouth at bedtime. 90 tablet 1   No current facility-administered medications for this visit.     Review of Systems Review of Systems  Constitutional: Negative.   Respiratory: Negative.   Cardiovascular: Negative.     Blood pressure (!) 148/72, pulse 60, resp. rate 18, height 5\' 4"  (1.626 m), weight 178 lb (80.7 kg).  Physical Exam Physical Exam  Constitutional: She is oriented to person, place, and time. She appears well-developed and well-nourished.  Abdominal: Soft. There is no tenderness.    Neurological: She is alert and oriented to person, place, and time.  Skin: Skin is warm and dry.  Psychiatric: Her behavior is normal.    Assessment    No evidence of recurrent ventral hernia.    Plan    The patient will notify the office if she has any concerns.  Follow up as needed. The patient is aware to call back for any questions or concerns.  This information has been scribed by Dorathy DaftMarsha Hatch RN, BSN,BC.   Earline MayotteByrnett, Hurbert Duran W 11/08/2016, 3:37 PM

## 2016-11-07 NOTE — Patient Instructions (Signed)
The patient is aware to call back for any questions or concerns.  

## 2016-11-16 DIAGNOSIS — N2581 Secondary hyperparathyroidism of renal origin: Secondary | ICD-10-CM | POA: Diagnosis not present

## 2016-11-16 DIAGNOSIS — N183 Chronic kidney disease, stage 3 (moderate): Secondary | ICD-10-CM | POA: Diagnosis not present

## 2016-11-16 DIAGNOSIS — R809 Proteinuria, unspecified: Secondary | ICD-10-CM | POA: Diagnosis not present

## 2016-11-16 DIAGNOSIS — I129 Hypertensive chronic kidney disease with stage 1 through stage 4 chronic kidney disease, or unspecified chronic kidney disease: Secondary | ICD-10-CM | POA: Diagnosis not present

## 2016-11-21 ENCOUNTER — Encounter: Payer: Self-pay | Admitting: Family Medicine

## 2016-11-21 ENCOUNTER — Other Ambulatory Visit: Payer: Self-pay | Admitting: Family Medicine

## 2016-11-21 ENCOUNTER — Other Ambulatory Visit: Payer: Self-pay

## 2016-11-21 ENCOUNTER — Ambulatory Visit (INDEPENDENT_AMBULATORY_CARE_PROVIDER_SITE_OTHER): Payer: Medicare Other | Admitting: Family Medicine

## 2016-11-21 DIAGNOSIS — Z5181 Encounter for therapeutic drug level monitoring: Secondary | ICD-10-CM | POA: Diagnosis not present

## 2016-11-21 DIAGNOSIS — E7800 Pure hypercholesterolemia, unspecified: Secondary | ICD-10-CM

## 2016-11-21 DIAGNOSIS — Z6830 Body mass index (BMI) 30.0-30.9, adult: Secondary | ICD-10-CM | POA: Diagnosis not present

## 2016-11-21 DIAGNOSIS — E6609 Other obesity due to excess calories: Secondary | ICD-10-CM

## 2016-11-21 DIAGNOSIS — I10 Essential (primary) hypertension: Secondary | ICD-10-CM | POA: Diagnosis not present

## 2016-11-21 DIAGNOSIS — R7301 Impaired fasting glucose: Secondary | ICD-10-CM

## 2016-11-21 DIAGNOSIS — E039 Hypothyroidism, unspecified: Secondary | ICD-10-CM | POA: Diagnosis not present

## 2016-11-21 LAB — COMPLETE METABOLIC PANEL WITH GFR
ALBUMIN: 4.2 g/dL (ref 3.6–5.1)
ALK PHOS: 74 U/L (ref 33–130)
ALT: 12 U/L (ref 6–29)
AST: 17 U/L (ref 10–35)
BUN: 21 mg/dL (ref 7–25)
CO2: 24 mmol/L (ref 20–31)
Calcium: 9.6 mg/dL (ref 8.6–10.4)
Chloride: 103 mmol/L (ref 98–110)
Creat: 1.14 mg/dL — ABNORMAL HIGH (ref 0.60–0.93)
GFR, EST NON AFRICAN AMERICAN: 46 mL/min — AB (ref >=60)
GFR, Est African American: 54 mL/min — ABNORMAL LOW (ref >=60)
GLUCOSE: 118 mg/dL — AB (ref 65–99)
POTASSIUM: 5.3 mmol/L (ref 3.5–5.3)
SODIUM: 138 mmol/L (ref 135–146)
Total Bilirubin: 0.4 mg/dL (ref 0.2–1.2)
Total Protein: 7.2 g/dL (ref 6.1–8.1)

## 2016-11-21 LAB — LIPID PANEL
CHOL/HDL RATIO: 3.2 ratio (ref <5.0)
CHOLESTEROL: 165 mg/dL (ref <200)
HDL: 52 mg/dL (ref >50)
LDL Cholesterol: 79 mg/dL (ref <100)
Triglycerides: 168 mg/dL — ABNORMAL HIGH (ref <150)
VLDL: 34 mg/dL — ABNORMAL HIGH (ref <30)

## 2016-11-21 LAB — TSH: TSH: 3.77 mIU/L

## 2016-11-21 MED ORDER — ROSUVASTATIN CALCIUM 10 MG PO TABS: 10.0000 mg | ORAL_TABLET | Freq: Every day | ORAL | 1 refills | Status: DC

## 2016-11-21 MED ORDER — LEVOTHYROXINE SODIUM 112 MCG PO TABS: 112.0000 ug | ORAL_TABLET | Freq: Every day | ORAL | 3 refills | Status: DC

## 2016-11-21 NOTE — Assessment & Plan Note (Signed)
Avoid whites, check A1c; work on weight loss

## 2016-11-21 NOTE — Patient Instructions (Signed)
Your goal blood pressure is less than 150 mmHg on top. Try to follow the DASH guidelines (DASH stands for Dietary Approaches to Stop Hypertension) Try to limit the sodium in your diet.  Ideally, consume less than 1.5 grams (less than 1,500mg ) per day. Do not add salt when cooking or at the table.  Check the sodium amount on labels when shopping, and choose items lower in sodium when given a choice. Avoid or limit foods that already contain a lot of sodium. Eat a diet rich in fruits and vegetables and whole grains. Try to limit saturated fats in your diet (bologna, hot dogs, barbeque, cheeseburgers, hamburgers, steak, bacon, sausage, cheese, etc.) and get more fresh fruits, vegetables, and whole grains Check out the information at familydoctor.org entitled "Nutrition for Weight Loss: What You Need to Know about Fad Diets" Try to lose between 1-2 pounds per week by taking in fewer calories and burning off more calories You can succeed by limiting portions, limiting foods dense in calories and fat, becoming more active, and drinking 8 glasses of water a day (64 ounces) Don't skip meals, especially breakfast, as skipping meals may alter your metabolism Do not use over-the-counter weight loss pills or gimmicks that claim rapid weight loss A healthy BMI (or body mass index) is between 18.5 and 24.9 You can calculate your ideal BMI at the NIH website [employer].huhttp://www.nhlbi.nih.gov/health/educational/lose_wt/BMI/bmicalc.htm

## 2016-11-21 NOTE — Assessment & Plan Note (Signed)
Check liver and kidneys 

## 2016-11-21 NOTE — Assessment & Plan Note (Signed)
Check lipids; continue statin; limit saturated fats 

## 2016-11-21 NOTE — Assessment & Plan Note (Signed)
Well-controlled; continue same; follow DASH guidelines

## 2016-11-21 NOTE — Assessment & Plan Note (Signed)
Check TSH 

## 2016-11-21 NOTE — Assessment & Plan Note (Signed)
Work on weight loss, most important thing she can do to prevent development of diabetes down the road

## 2016-11-22 LAB — HEMOGLOBIN A1C
HEMOGLOBIN A1C: 5.8 % — AB (ref <5.7)
MEAN PLASMA GLUCOSE: 120 mg/dL

## 2016-12-04 MED ORDER — LEVOTHYROXINE SODIUM 112 MCG PO TABS: 112.0000 ug | ORAL_TABLET | Freq: Every day | ORAL | 0 refills | Status: DC

## 2016-12-04 NOTE — Telephone Encounter (Signed)
Glitch in computer system; I was not getting refill requests; addressing today ------------------------- Already refilled on 11/21/16 for one year, [place] there

## 2016-12-08 NOTE — Progress Notes (Signed)
BP 118/62   Pulse (!) 57   Temp 97.9 F (36.6 C) (Oral)   Resp 14   Wt 178 lb (80.7 kg)   SpO2 97%   BMI 30.55 kg/m    Subjective:    Patient ID: Janice [name], female    DOB: 06/08/1941, 76 y.o.   MRN: [id number]  HPI: Janice [name] is a 76 y.o. female  Chief Complaint  Patient presents with  . Follow-up   Computer issues during our visit; partially saved note; trying to recreate visit to complete visit  Here for 6 months; she has had HTN and monitors her BP at home; 122-146 on top; avoids excess salt She just saw the heart doctor, Dr. Alvino ChapelIngal; she had stress test in March She sees Dr. Wynelle LinkKolluru, nephrologist, and he checked her urine and says thinks are looking better; avoids NSAIDs, knows only tylenol; she is a good water drinker Prediabetes; last A1c 5.8; knows to avoid simple carbs; not many sugary drinks High cholesterol; knows to limit saturated fats but there is room for improvement Recent Labs       Lab Results  Component Value Date   CHOL 138 05/22/2016   HDL 51 05/22/2016   LDLCALC 62 05/22/2016   TRIG 123 05/22/2016   CHOLHDL 2.7 05/22/2016     Hypothyroidism; consistent dose; energy level is good and the same  PHQ questionnaire score of zero  Depression screen [facility] 2/9 11/21/2016 05/22/2016 11/21/2015  Decreased Interest 0 0 0  Down, Depressed, Hopeless 0 0 0  PHQ - 2 Score 0 0 0   Relevant past medical, surgical, family and social history reviewed Past Medical History:  Diagnosis Date  . Chronic kidney disease   . Hernia, ventral   . Hyperlipidemia   . Hypertension   . Subclavian arterial stenosis (HCC)   . Thyroid disease    Past Surgical History:  Procedure Laterality Date  . artery bypass rechanneling  2010  . CHOLECYSTECTOMY    . HERNIA REPAIR  03/05/2016   15 x 22 cm retrorectus Atrium mesh repair.  . INSERTION OF MESH N/A 03/05/2016   Procedure: INSERTION OF MESH;  Surgeon: Earline MayotteJeffrey W Byrnett, MD;  Location: [facility] ORS;  Service:  General;  Laterality: N/A;  . VENTRAL HERNIA REPAIR N/A 03/05/2016   Procedure: HERNIA REPAIR VENTRAL ADULT;  Surgeon: Earline MayotteJeffrey W Byrnett, MD;  Location: [facility] ORS;  Service: General;  Laterality: N/A;   Family History  Problem Relation Age of Onset  . Heart disease Mother   . Hypertension Mother   . Heart disease Father   . Hypertension Father   . Hypertension Sister   . Diabetes Sister   . Cancer Neg Hx   . COPD Neg Hx   . Stroke Neg Hx    Social History  Substance Use Topics  . Smoking status: Former Smoker    Types: Cigarettes    Quit date: 12/03/2008  . Smokeless tobacco: Never Used  . Alcohol use No    Interim medical history since last visit reviewed. Allergies and medications reviewed  Review of Systems Per HPI unless specifically indicated above     Objective:    BP 118/62   Pulse (!) 57   Temp 97.9 F (36.6 C) (Oral)   Resp 14   Wt 178 lb (80.7 kg)   SpO2 97%   BMI 30.55 kg/m   Wt Readings from Last 3 Encounters:  11/21/16 178 lb (80.7 kg)  11/07/16 178 lb (80.7  kg)  10/17/16 177 lb 12 oz (80.6 kg)    Physical Exam  Constitutional: She appears well-developed and well-nourished. No distress.  obese  HENT:  Head: Normocephalic and atraumatic.  Eyes: EOM are normal. No scleral icterus.  Neck: No thyromegaly present.  Cardiovascular: Normal rate, regular rhythm and normal heart sounds.   No murmur heard. Pulmonary/Chest: Effort normal and breath sounds normal.  Abdominal: Soft. Bowel sounds are normal. She exhibits no distension.  Musculoskeletal: Normal range of motion. She exhibits no edema.  Neurological: She is alert. She exhibits normal muscle tone.  Skin: Skin is warm and dry. She is not diaphoretic. No pallor.  Psychiatric: She has a normal mood and affect. Her behavior is normal. Judgment and thought content normal.      Assessment & Plan:   Problem List Items Addressed This Visit      Cardiovascular and Mediastinum   Benign hypertension      Well-controlled; continue same; follow DASH guidelines      Relevant Medications   rosuvastatin (CRESTOR) 10 MG tablet   Other Relevant Orders   COMPLETE METABOLIC PANEL WITH GFR (Completed)     Endocrine   IFG (impaired fasting glucose)    Avoid whites, check A1c; work on weight loss      Relevant Orders   Hemoglobin A1C (Completed)   Hypothyroidism    Check TSH      Relevant Orders   TSH (Completed)     Other   Obesity    Work on weight loss, most important thing she can do to prevent development of diabetes down the road      Relevant Orders   Hemoglobin A1C (Completed)   Lipid panel (Completed)   Medication monitoring encounter    Check liver and kidneys      Relevant Orders   COMPLETE METABOLIC PANEL WITH GFR (Completed)   Hypercholesteremia    Check lipids; continue statin; limit saturated fats      Relevant Medications   rosuvastatin (CRESTOR) 10 MG tablet   Other Relevant Orders   Lipid panel (Completed)       Follow up plan: Return in about 6 months (around 05/22/2017) for follow-up and fasting labs.  An after-visit summary was printed and given to the patient at check-out.  Please see the patient instructions which may contain other information and recommendations beyond what is mentioned above in the assessment and plan.  Meds ordered this encounter  Medications  . rosuvastatin (CRESTOR) 10 MG tablet    Sig: Take 1 tablet (10 mg total) by mouth at bedtime.    Dispense:  90 tablet    Refill:  1  . DISCONTD: levothyroxine (SYNTHROID, LEVOTHROID) 112 MCG tablet    Sig: Take 1 tablet (112 mcg total) by mouth daily before breakfast.    Dispense:  90 tablet    Refill:  3    Orders Placed This Encounter  Procedures  . Hemoglobin A1C  . TSH  . COMPLETE METABOLIC PANEL WITH GFR  . Lipid panel

## 2016-12-21 ENCOUNTER — Other Ambulatory Visit: Payer: Self-pay | Admitting: Family Medicine

## 2016-12-21 NOTE — Telephone Encounter (Signed)
Rx request for crestor; I just approved 6 months in late December; please resolve with pharmacy; thank you

## 2017-04-05 ENCOUNTER — Other Ambulatory Visit: Payer: Self-pay | Admitting: Family Medicine

## 2017-05-01 ENCOUNTER — Other Ambulatory Visit: Payer: Self-pay | Admitting: Family Medicine

## 2017-05-06 DIAGNOSIS — N2581 Secondary hyperparathyroidism of renal origin: Secondary | ICD-10-CM | POA: Diagnosis not present

## 2017-05-06 DIAGNOSIS — N183 Chronic kidney disease, stage 3 (moderate): Secondary | ICD-10-CM | POA: Diagnosis not present

## 2017-05-06 DIAGNOSIS — R809 Proteinuria, unspecified: Secondary | ICD-10-CM | POA: Diagnosis not present

## 2017-05-06 DIAGNOSIS — I129 Hypertensive chronic kidney disease with stage 1 through stage 4 chronic kidney disease, or unspecified chronic kidney disease: Secondary | ICD-10-CM | POA: Diagnosis not present

## 2017-05-09 ENCOUNTER — Ambulatory Visit (INDEPENDENT_AMBULATORY_CARE_PROVIDER_SITE_OTHER): Payer: Medicare Other | Admitting: Family Medicine

## 2017-05-09 ENCOUNTER — Encounter: Payer: Self-pay | Admitting: Family Medicine

## 2017-05-09 DIAGNOSIS — R7301 Impaired fasting glucose: Secondary | ICD-10-CM

## 2017-05-09 DIAGNOSIS — E7800 Pure hypercholesterolemia, unspecified: Secondary | ICD-10-CM

## 2017-05-09 DIAGNOSIS — E663 Overweight: Secondary | ICD-10-CM | POA: Diagnosis not present

## 2017-05-09 DIAGNOSIS — I10 Essential (primary) hypertension: Secondary | ICD-10-CM | POA: Diagnosis not present

## 2017-05-09 MED ORDER — LEVOTHYROXINE SODIUM 112 MCG PO TABS: 112.0000 ug | ORAL_TABLET | Freq: Every day | ORAL | 2 refills | Status: DC

## 2017-05-09 MED ORDER — ROSUVASTATIN CALCIUM 10 MG PO TABS: 10.0000 mg | ORAL_TABLET | Freq: Every day | ORAL | 1 refills | Status: DC

## 2017-05-09 MED ORDER — LOSARTAN POTASSIUM 50 MG PO TABS: 50.0000 mg | ORAL_TABLET | Freq: Every day | ORAL | 3 refills | Status: DC

## 2017-05-09 NOTE — Assessment & Plan Note (Signed)
Try to limit saturated fats; reviewed last lipid panel; will recheck upon her return in the fall

## 2017-05-09 NOTE — Assessment & Plan Note (Signed)
It is too early now to check A1c; patient politely requests that we wait until she returns in 3 months; I think that's reasonable; knows to avoid whites

## 2017-05-09 NOTE — Patient Instructions (Signed)
Try to limit saturated fats in your diet (bologna, hot dogs, barbeque, cheeseburgers, hamburgers, steak, bacon, sausage, cheese, etc.) and get more fresh fruits, vegetables, and whole grains Try to follow the DASH guidelines (DASH stands for Dietary Approaches to Stop Hypertension) Try to limit the sodium in your diet.  Ideally, consume less than 1.5 grams (less than 1,500mg) per day. Do not add salt when cooking or at the table.  Check the sodium amount on labels when shopping, and choose items lower in sodium when given a choice. Avoid or limit foods that already contain a lot of sodium. Eat a diet rich in fruits and vegetables and whole grains.  

## 2017-05-09 NOTE — Progress Notes (Signed)
BP (!) 128/58 (BP Location: Right Arm, Patient Position: Sitting, Cuff Size: Normal)   Pulse 64   Temp 98.2 F (36.8 C) (Oral)   Resp 16   Ht 5\' 4"  (1.626 m)   Wt 172 lb 14.4 oz (78.4 kg)   SpO2 98%   BMI 29.68 kg/m    Subjective:    Patient ID: Janice [name], female    DOB: [birth year], 76 y.o.   MRN: [id number]  HPI: KILANI JOFFE is a 76 y.o. female  Chief Complaint  Patient presents with  . Follow-up    6 months    HPI Patient is here for visit She is going to [place] The nephrologist just saw her and did blood and urine tests  Prediabetes; no blurred vision, no dry mouth;  Lab Results  Component Value Date   HGBA1C 5.8 (H) 11/21/2016   High cholesterol; tries to avoid fatty meats; no eggs, rarely; not much cheese  Hypothyroidism; last TSH normal; energy level is good  Depression screen [facility] 2/9 05/09/2017 11/21/2016 05/22/2016 11/21/2015  Decreased Interest 0 0 0 0  Down, Depressed, Hopeless 0 0 0 0  PHQ - 2 Score 0 0 0 0   Relevant past medical, surgical, family and social history reviewed Past Medical History:  Diagnosis Date  . Chronic kidney disease   . Hernia, ventral   . Hyperlipidemia   . Hypertension   . Subclavian arterial stenosis (HCC)   . Thyroid disease    Past Surgical History:  Procedure Laterality Date  . artery bypass rechanneling  2010  . CHOLECYSTECTOMY    . HERNIA REPAIR  03/05/2016   15 x 22 cm retrorectus Atrium mesh repair.  . INSERTION OF MESH N/A 03/05/2016   Procedure: INSERTION OF MESH;  Surgeon: [name], MD;  Location: [facility] ORS;  Service: General;  Laterality: N/A;  . VENTRAL HERNIA REPAIR N/A 03/05/2016   Procedure: HERNIA REPAIR VENTRAL ADULT;  Surgeon: [name], MD;  Location: [facility] ORS;  Service: General;  Laterality: N/A;   Family History  Problem Relation Age of Onset  . Heart disease Mother   . Hypertension Mother   . Heart disease Father   . Hypertension Father   . Hypertension Sister   .  Diabetes Sister   . Cancer Neg Hx   . COPD Neg Hx   . Stroke Neg Hx    Social History   Social History  . Marital status: Divorced    Spouse name: N/A  . Number of children: N/A  . Years of education: N/A   Occupational History  . Not on file.   Social History Main Topics  . Smoking status: Former Smoker    Types: Cigarettes    Quit date: 12/03/2008  . Smokeless tobacco: Never Used  . Alcohol use No  . Drug use: No  . Sexual activity: Not on file   Other Topics Concern  . Not on file   Social History Narrative  . No narrative on file    Interim medical history since last visit reviewed. Allergies and medications reviewed  Review of Systems Per HPI unless specifically indicated above     Objective:    BP (!) 128/58 (BP Location: Right Arm, Patient Position: Sitting, Cuff Size: Normal)   Pulse 64   Temp 98.2 F (36.8 C) (Oral)   Resp 16   Ht 5\' 4"  (1.626 m)   Wt 172 lb 14.4 oz (78.4 kg)   SpO2 98%  BMI 29.68 kg/m   Wt Readings from Last 3 Encounters:  05/09/17 172 lb 14.4 oz (78.4 kg)  11/21/16 178 lb (80.7 kg)  11/07/16 178 lb (80.7 kg)    Physical Exam  Constitutional: She appears well-developed and well-nourished. No distress.  obese  HENT:  Head: Atraumatic.  Eyes: EOM are normal. No scleral icterus.  Neck: No thyromegaly present.  Cardiovascular: Normal rate, regular rhythm and normal heart sounds.   No murmur heard. Pulmonary/Chest: Effort normal and breath sounds normal.  Abdominal: Soft. Bowel sounds are normal. She exhibits no distension.  Musculoskeletal: She exhibits no edema.  Neurological: She is alert. She exhibits normal muscle tone.  Skin: Skin is warm and dry. No pallor.  Psychiatric: She has a normal mood and affect. Her behavior is normal. Judgment and thought content normal.    Results for orders placed or performed in visit on 11/21/16  Lipid panel  Result Value Ref Range   Cholesterol 165 <200 mg/dL   Triglycerides 784168  (H) <150 mg/dL   HDL 52 >69>50 mg/dL   Total CHOL/HDL Ratio 3.2 <5.0 Ratio   VLDL 34 (H) <30 mg/dL   LDL Cholesterol 79 <629<100 mg/dL      Assessment & Plan:   Problem List Items Addressed This Visit      Cardiovascular and Mediastinum   Essential hypertension, benign    Well-controlled today; no lows; monitored by Dr. Wynelle LinkKolluru; try to follow DASH guidelines      Relevant Medications   losartan (COZAAR) 50 MG tablet   rosuvastatin (CRESTOR) 10 MG tablet     Endocrine   IFG (impaired fasting glucose)    It is too early now to check A1c; patient politely requests that we wait until she returns in 3 months; I think that's reasonable; knows to avoid whites        Other   Overweight (BMI 25.0-29.9)    Praise given to patient; encouragement for a little more weight loss      Hypercholesteremia    Try to limit saturated fats; reviewed last lipid panel; will recheck upon her return in the fall      Relevant Medications   losartan (COZAAR) 50 MG tablet   rosuvastatin (CRESTOR) 10 MG tablet       Follow up plan: Return for Medicare Wellness check and fasting labs in the fall.  An after-visit summary was printed and given to the patient at check-out.  Please see the patient instructions which may contain other information and recommendations beyond what is mentioned above in the assessment and plan.  Meds ordered this encounter  Medications  . levothyroxine (SYNTHROID, LEVOTHROID) 112 MCG tablet    Sig: Take 1 tablet (112 mcg total) by mouth daily before breakfast.    Dispense:  90 tablet    Refill:  2  . losartan (COZAAR) 50 MG tablet    Sig: Take 1 tablet (50 mg total) by mouth daily.    Dispense:  90 tablet    Refill:  3  . rosuvastatin (CRESTOR) 10 MG tablet    Sig: Take 1 tablet (10 mg total) by mouth at bedtime.    Dispense:  90 tablet    Refill:  1    No orders of the defined types were placed in this encounter.

## 2017-05-09 NOTE — Assessment & Plan Note (Signed)
Well-controlled today; no lows; monitored by Dr. Wynelle LinkKolluru; try to follow DASH guidelines

## 2017-05-09 NOTE — Assessment & Plan Note (Signed)
Praise given to patient; encouragement for a little more weight loss

## 2017-05-22 ENCOUNTER — Ambulatory Visit: Payer: Medicare Other | Admitting: Family Medicine

## 2017-06-21 ENCOUNTER — Other Ambulatory Visit: Payer: Self-pay | Admitting: Family Medicine

## 2017-06-21 MED ORDER — ROSUVASTATIN CALCIUM 10 MG PO TABS: 10.0000 mg | ORAL_TABLET | Freq: Every day | ORAL | 0 refills | Status: DC

## 2017-09-10 ENCOUNTER — Encounter: Payer: Medicare Other | Admitting: Family Medicine

## 2017-09-28 ENCOUNTER — Emergency Department
Admission: EM | Admit: 2017-09-28 | Discharge: 2017-09-28 | Disposition: A | Discharge status: Home / Self Care | Payer: Medicare Other | Attending: Emergency Medicine | Admitting: Emergency Medicine

## 2017-09-28 ENCOUNTER — Encounter: Payer: Self-pay | Admitting: Emergency Medicine

## 2017-09-28 ENCOUNTER — Emergency Department: Payer: Medicare Other

## 2017-09-28 DIAGNOSIS — N189 Chronic kidney disease, unspecified: Secondary | ICD-10-CM | POA: Diagnosis not present

## 2017-09-28 DIAGNOSIS — Z87891 Personal history of nicotine dependence: Secondary | ICD-10-CM | POA: Diagnosis not present

## 2017-09-28 DIAGNOSIS — S0990XA Unspecified injury of head, initial encounter: Secondary | ICD-10-CM | POA: Diagnosis not present

## 2017-09-28 DIAGNOSIS — I129 Hypertensive chronic kidney disease with stage 1 through stage 4 chronic kidney disease, or unspecified chronic kidney disease: Secondary | ICD-10-CM | POA: Diagnosis not present

## 2017-09-28 DIAGNOSIS — Y929 Unspecified place or not applicable: Secondary | ICD-10-CM | POA: Insufficient documentation

## 2017-09-28 DIAGNOSIS — Y999 Unspecified external cause status: Secondary | ICD-10-CM | POA: Diagnosis not present

## 2017-09-28 DIAGNOSIS — Z79899 Other long term (current) drug therapy: Secondary | ICD-10-CM | POA: Insufficient documentation

## 2017-09-28 DIAGNOSIS — Y939 Activity, unspecified: Secondary | ICD-10-CM | POA: Diagnosis not present

## 2017-09-28 DIAGNOSIS — E039 Hypothyroidism, unspecified: Secondary | ICD-10-CM | POA: Diagnosis not present

## 2017-09-28 DIAGNOSIS — S0993XA Unspecified injury of face, initial encounter: Secondary | ICD-10-CM | POA: Diagnosis not present

## 2017-09-28 DIAGNOSIS — W010XXA Fall on same level from slipping, tripping and stumbling without subsequent striking against object, initial encounter: Secondary | ICD-10-CM | POA: Insufficient documentation

## 2017-09-28 DIAGNOSIS — W19XXXA Unspecified fall, initial encounter: Secondary | ICD-10-CM | POA: Diagnosis not present

## 2017-09-28 DIAGNOSIS — S022XXA Fracture of nasal bones, initial encounter for closed fracture: Secondary | ICD-10-CM | POA: Insufficient documentation

## 2017-09-28 MED ORDER — OXYMETAZOLINE HCL 0.05 % NA SOLN: 1.0000 | Freq: Two times a day (BID) | NASAL | 0 refills | Status: AC

## 2017-09-28 MED ORDER — AMOXICILLIN-POT CLAVULANATE 875-125 MG PO TABS
1.0000 | ORAL_TABLET | Freq: Once | ORAL | Status: AC
  Administered 2017-09-28: 1 via ORAL
  Filled 2017-09-28: qty 1

## 2017-09-28 MED ORDER — AMOXICILLIN-POT CLAVULANATE 875-125 MG PO TABS: 1.0000 | ORAL_TABLET | Freq: Two times a day (BID) | ORAL | 0 refills | Status: AC

## 2017-09-28 MED ORDER — ACETAMINOPHEN 325 MG PO TABS
650.0000 mg | ORAL_TABLET | Freq: Once | ORAL | Status: AC
  Administered 2017-09-28: 650 mg via ORAL
  Filled 2017-09-28: qty 2

## 2017-09-28 MED ORDER — OXYMETAZOLINE HCL 0.05 % NA SOLN
1.0000 | Freq: Once | NASAL | Status: AC
  Administered 2017-09-28: 1 via NASAL
  Filled 2017-09-28: qty 15

## 2017-09-28 NOTE — ED Notes (Signed)
Pt. Going home with family. 

## 2017-09-28 NOTE — Discharge Instructions (Signed)
Follow up with the ENT in 1 week.  Return to the ER for new or worsening pain, bleeding, headache, confusion, weakness or any other symptoms that concern you.

## 2017-09-28 NOTE — ED Provider Notes (Signed)
[facility] Emergency Department Provider Note ____________________________________________   First MD Initiated Contact with Patient 09/28/17 1907     (approximate)  I have reviewed the triage vital signs and the nursing notes.   HISTORY  Chief Complaint Fall and Facial Injury    HPI Janice [name] is a 76 y.o. female past medical history as below (on 81 mg ASA per day) who presents with facial injury, acute onset after mechanical fall from standing height, associated with swelling around her nose, and not associated with LOC.  Patient denies severe headache or vomiting.  She denies any other injuries.  Past Medical History:  Diagnosis Date  . Chronic kidney disease   . Hernia, ventral   . Hyperlipidemia   . Hypertension   . Subclavian arterial stenosis (HCC)   . Thyroid disease     Patient Active Problem List   Diagnosis Date Noted  . Essential hypertension, benign 02/01/2016  . Peripheral vascular disease (HCC) 02/01/2016  . LBBB (left bundle branch block) 01/23/2016  . Hypothyroidism 11/21/2015  . Medication monitoring encounter 11/21/2015  . Atherosclerosis of arteries 05/23/2015  . IFG (impaired fasting glucose) 05/23/2015  . Benign hypertension 05/23/2015  . Hypercholesteremia 05/23/2015  . Overweight (BMI 25.0-29.9) 05/23/2015    Past Surgical History:  Procedure Laterality Date  . artery bypass rechanneling  2010  . CHOLECYSTECTOMY    . HERNIA REPAIR  03/05/2016   15 x 22 cm retrorectus Atrium mesh repair.  . INSERTION OF MESH N/A 03/05/2016   Procedure: INSERTION OF MESH;  Surgeon: [name], MD;  Location: [facility] ORS;  Service: General;  Laterality: N/A;  . VENTRAL HERNIA REPAIR N/A 03/05/2016   Procedure: HERNIA REPAIR VENTRAL ADULT;  Surgeon: [name], MD;  Location: [facility] ORS;  Service: General;  Laterality: N/A;    Prior to Admission medications   Medication Sig Start Date End Date Taking? Authorizing Provider    amLODipine (NORVASC) 10 MG tablet Take 1 tablet (10 mg total) by mouth daily. 05/22/16   Kerman Passey, MD  amoxicillin-clavulanate (AUGMENTIN) 875-125 MG tablet Take 1 tablet by mouth 2 (two) times daily. 09/29/17 10/06/17  Dionne Bucy, MD  aspirin 81 MG tablet Take 81 mg by mouth daily.    [provider]  Cholecalciferol (VITAMIN D3) 1000 UNITS CAPS Take 1,000 Int'l Units by mouth daily.    [provider]  hydrochlorothiazide (HYDRODIURIL) 25 MG tablet TAKE 1 TABLET (25 MG TOTAL) BY MOUTH DAILY. 04/05/17   Kerman Passey, MD  levothyroxine (SYNTHROID, LEVOTHROID) 112 MCG tablet Take 1 tablet (112 mcg total) by mouth daily before breakfast. 05/09/17   Lada, Janit Bern, MD  losartan (COZAAR) 50 MG tablet Take 1 tablet (50 mg total) by mouth daily. 05/09/17   Kerman Passey, MD  metoprolol tartrate (LOPRESSOR) 25 MG tablet TAKE 1 TABLET (25 MG TOTAL) BY MOUTH 2 (TWO) TIMES DAILY. 05/01/17   Kerman Passey, MD  oxymetazoline (AFRIN) 0.05 % nasal spray Place 1 spray into both nostrils 2 (two) times daily. 09/29/17 10/02/17  Dionne Bucy, MD  rosuvastatin (CRESTOR) 10 MG tablet Take 1 tablet (10 mg total) by mouth at bedtime. 06/21/17   Kerman Passey, MD    Allergies Patient has no known allergies.  Family History  Problem Relation Age of Onset  . Heart disease Mother   . Hypertension Mother   . Heart disease Father   . Hypertension Father   . Hypertension Sister   .  Diabetes Sister   . Cancer Neg Hx   . COPD Neg Hx   . Stroke Neg Hx     Social History Social History  Substance Use Topics  . Smoking status: Former Smoker    Types: Cigarettes    Quit date: 12/03/2008  . Smokeless tobacco: Never Used  . Alcohol use No    Review of Systems  Constitutional: No fever.  Eyes: No visual changes. ENT: No neck pain. Cardiovascular: Denies chest pain. Respiratory: Denies shortness of breath. Gastrointestinal: No abdominal pain. Genitourinary: No flank  pain.  Musculoskeletal: Negative for back pain. Skin: Positive for facial abrasions. Neurological: Negative for headache.   ____________________________________________   PHYSICAL EXAM:  VITAL SIGNS: ED Triage Vitals  Enc Vitals Group     BP 09/28/17 1849 (!) 162/67     Pulse Rate 09/28/17 1849 79     Resp 09/28/17 1849 18     Temp 09/28/17 1849 (!) 97.5 F (36.4 C)     Temp Source 09/28/17 1849 Oral     SpO2 09/28/17 1849 99 %     Weight 09/28/17 1850 170 lb (77.1 kg)     Height 09/28/17 1850 5\' 4"  (1.626 m)     Head Circumference --      Peak Flow --      Pain Score 09/28/17 1849 5     Pain Loc --      Pain Edu? --      Excl. in GC? --     Constitutional: Alert and oriented. Well appearing and in no acute distress. Eyes: Conjunctivae are normal. EOMI.  PERRLA.  Head: Swelling and deformity to nose; remainder of facial bones nontender.  Nose: No congestion/rhinnorhea.  Small amt blood from bilat nares.  No septal hematoma.  Mouth/Throat: Mucous membranes are moist.   Neck: Normal range of motion. Cervical spine nontender.  Cardiovascular: Good peripheral circulation. Respiratory: Normal respiratory effort.   Gastrointestinal: No distention.  Musculoskeletal: Extremities warm and well perfused.  Neurologic:  Normal speech and language. No gross focal neurologic deficits are appreciated. Motor and sensory intact in all extremities.  Normal coordination.  Skin:  Skin is warm and dry. No rash noted. Psychiatric: Mood and affect are normal. Speech and behavior are normal.  ____________________________________________   LABS (all labs ordered are listed, but only abnormal results are displayed)  Labs Reviewed - No data to display ____________________________________________  EKG   ____________________________________________  RADIOLOGY  CT head: no ICH  CT maxillofacial: Minimally displaced nasal  fracture  ____________________________________________   PROCEDURES  Procedure(s) performed: No    Critical Care performed: No ____________________________________________   INITIAL IMPRESSION / ASSESSMENT AND PLAN / ED COURSE  Pertinent labs & imaging results that were available during my care of the patient were reviewed by me and considered in my medical decision making (see chart for details).  76 year old female on daily aspirin presents with facial injury after mechanical fall from standing height.  No LOC.  Vital signs are normal, patient is relatively well-appearing, and there is significant swelling around the nose with small amount of blood coming from the nose but no evidence of other injury.  Plan for CT head and maxillofacial.  Suspect likely nasal fracture.    ----------------------------------------- 9:05 PM on 09/28/2017 -----------------------------------------  CT reveals minimally displaced nasal bone fracture but no other acute findings.  Will discharge with ENT follow-up, Afrin, and Augmentin.  ____________________________________________   FINAL CLINICAL IMPRESSION(S) / ED DIAGNOSES  Final diagnoses:  Closed  fracture of nasal bone, initial encounter      NEW MEDICATIONS STARTED DURING THIS VISIT:  New Prescriptions   AMOXICILLIN-CLAVULANATE (AUGMENTIN) 875-125 MG TABLET    Take 1 tablet by mouth 2 (two) times daily.   OXYMETAZOLINE (AFRIN) 0.05 % NASAL SPRAY    Place 1 spray into both nostrils 2 (two) times daily.     Note:  This document was prepared using Dragon voice recognition software and may include unintentional dictation errors.     Dionne BucySiadecki, Kilynn Fitzsimmons, MD 09/28/17 2105

## 2017-09-28 NOTE — ED Notes (Signed)
Pt. Returned from ct ice pack applied to bridge of nose.

## 2017-09-28 NOTE — ED Triage Notes (Signed)
[facility] [place] [facility] [place] [employer] [place] c/o mechanical fall with injury to face/nose. Pt states she tripped over a speed bump and fell forward onto her face. Swelling and deformity noted to nose. Small abrasion to forehead, bleeding controlled. Pt reports no other pain complaints besides nose. Arrives in towel "soft collar" from EMS. Denies LOC. On 81mg  ASA daily.

## 2017-09-28 NOTE — ED Notes (Signed)
Pt. States mechanical fall on a step/curb at [employer].  Pt. Denies LOC.  Pain/swelling/abrassion to nose and forehead.

## 2017-10-04 DIAGNOSIS — J342 Deviated nasal septum: Secondary | ICD-10-CM | POA: Diagnosis not present

## 2017-10-04 DIAGNOSIS — S022XXA Fracture of nasal bones, initial encounter for closed fracture: Secondary | ICD-10-CM | POA: Diagnosis not present

## 2017-10-08 ENCOUNTER — Ambulatory Visit: Payer: Medicare Other | Admitting: Family Medicine

## 2017-10-08 ENCOUNTER — Ambulatory Visit (INDEPENDENT_AMBULATORY_CARE_PROVIDER_SITE_OTHER): Payer: Medicare Other | Admitting: Family Medicine

## 2017-10-08 ENCOUNTER — Encounter: Payer: Self-pay | Admitting: Family Medicine

## 2017-10-08 ENCOUNTER — Ambulatory Visit: Payer: Medicare Other

## 2017-10-08 VITALS — BP 130/72 | HR 55 | Temp 98.2°F | Resp 14 | Ht 64.0 in | Wt 165.1 lb

## 2017-10-08 DIAGNOSIS — Z66 Do not resuscitate: Secondary | ICD-10-CM

## 2017-10-08 DIAGNOSIS — E7800 Pure hypercholesterolemia, unspecified: Secondary | ICD-10-CM | POA: Diagnosis not present

## 2017-10-08 DIAGNOSIS — I10 Essential (primary) hypertension: Secondary | ICD-10-CM

## 2017-10-08 DIAGNOSIS — E039 Hypothyroidism, unspecified: Secondary | ICD-10-CM | POA: Diagnosis not present

## 2017-10-08 DIAGNOSIS — E663 Overweight: Secondary | ICD-10-CM

## 2017-10-08 DIAGNOSIS — Z01818 Encounter for other preprocedural examination: Secondary | ICD-10-CM

## 2017-10-08 DIAGNOSIS — Z5181 Encounter for therapeutic drug level monitoring: Secondary | ICD-10-CM

## 2017-10-08 DIAGNOSIS — R7301 Impaired fasting glucose: Secondary | ICD-10-CM

## 2017-10-08 DIAGNOSIS — Z23 Encounter for immunization: Secondary | ICD-10-CM

## 2017-10-08 DIAGNOSIS — S022XXD Fracture of nasal bones, subsequent encounter for fracture with routine healing: Secondary | ICD-10-CM | POA: Diagnosis not present

## 2017-10-08 NOTE — Assessment & Plan Note (Signed)
Check liver, kidney function 

## 2017-10-08 NOTE — Assessment & Plan Note (Signed)
Controlled today; continue medicine 

## 2017-10-08 NOTE — Assessment & Plan Note (Signed)
Check TSH and adjust medicine if needed 

## 2017-10-08 NOTE — Progress Notes (Signed)
BP 130/72 (BP Location: Left Arm, Patient Position: Sitting, Cuff Size: Normal)   Pulse (!) 55   Temp 98.2 F (36.8 C) (Oral)   Resp 14   Ht 5\' 4"  (1.626 m )   Wt 165 lb 1.6 oz (74.9 kg)   SpO2 98%   BMI 28.34 kg/m    Subjective:    Patient ID: Janice [name], female    DOB: 11/18/1941, 76 y.o.   MRN: [id number]  HPI: Janice [name] is a 76 y.o. female  Chief Complaint  Patient presents with  . Pre-op Exam    HPI Patient fell and broke her nose; tripped, mechanical fall; she was evaluated in the ER on 09/28/17; note reviewed She was put on afrin, augmentin, and sent to ENT  She is going to have surgery on Thursday for a hairline fracture in her nasal bone CT scan done in the ER on 09/28/17 IMPRESSION: No acute intracranial findings.  Displaced mildly comminuted nasal bone fracture.  Mild opacification over the ethmoid sinus with subtle air-fluid level over the sphenoid sinus likely hemorrhagic debris.  Minimal soft tissue swelling over the midline frontal scalp.   Electronically Signed   By: Elberta Fortisaniel  Boyle M.D.   On: 09/28/2017 20:45   She had a nuclear medicine stress test done March 2017 which was read as no significant ischemia; normal wall motion; EF 72%; resting EKG showed LBBB; low risk scan, signed by Dr. Julien Nordmannimothy Gollan; she saw Dr. Alvino ChapelIngal on October 17, 2016, last visit reviewed; EKG then was heart rate of 55, sinus bradycardia; LBBB; she also had an echocardiogram done March 2017, and report was reviewed; mild MR, mildly elevated PA pressure (38 mmHg); prior to needing cardiac clearance for her hernia repair in 2017, she had had any heart problems, did not see heart doctor regularly; no fam hx of heart trouble  No hx of MRSA  No dysuria; no boils or sores on the skin; no cough; no fever She has been under general anesthesia in the past; no problems with anesthesia, no problems with waking up afterwards No hx of bleeding disorders; no easy bruising or  bleeding She is going to see the ENT today; still taking aspirin right now No chest pain whatsoever; no trouble breathing except for the swelling in the nose from the fracture No swelling in the legs like heart failure No blood in the stool or urine  Her medical conditions include prediabetes; last glucose was 118 in Dec; last A1c was 5.8 Lab Results  Component Value Date   HGBA1C 5.8 (H) 11/21/2016   Her last cholesterol was in December 2017 as well Lab Results  Component Value Date   CHOL 165 11/21/2016   HDL 52 11/21/2016   LDLCALC 79 11/21/2016   TRIG 168 (H) 11/21/2016   CHOLHDL 3.2 11/21/2016   Creatinine last check in December 2017 was slightly elevated at 1.14  She has not thought about code status   Depression screen [employer] 2/9 10/08/2017 05/09/2017 11/21/2016 05/22/2016 11/21/2015  Decreased Interest 0 0 0 0 0  Down, Depressed, Hopeless 0 0 0 0 0  PHQ - 2 Score 0 0 0 0 0    Relevant past medical, surgical, family and social history reviewed Past Medical History:  Diagnosis Date  . Chronic kidney disease   . Hernia, ventral   . Hyperlipidemia   . Hypertension   . Subclavian arterial stenosis (HCC)   . Thyroid disease    Past Surgical History:  Procedure  Laterality Date  . artery bypass rechanneling  2010  . CHOLECYSTECTOMY    . HERNIA REPAIR  03/05/2016   15 x 22 cm retrorectus Atrium mesh repair.   Family History  Problem Relation Age of Onset  . Heart disease Mother   . Hypertension Mother   . Heart disease Father   . Hypertension Father   . Hypertension Sister   . Diabetes Sister   . Cancer Neg Hx   . COPD Neg Hx   . Stroke Neg Hx    Social History   Socioeconomic History  . Marital status: Divorced    Spouse name: Not on file  . Number of children: Not on file  . Years of education: Not on file  . Highest education level: Not on file  Social Needs  . Financial resource strain: Not on file  . Food insecurity - worry: Not on file  . Food  insecurity - inability: Not on file  . Transportation needs - medical: Not on file  . Transportation needs - non-medical: Not on file  Occupational History  . Not on file  Tobacco Use  . Smoking status: Former Smoker    Types: Cigarettes    Last attempt to quit: 12/03/2008    Years since quitting: 8.8  . Smokeless tobacco: Never Used  Substance and Sexual Activity  . Alcohol use: No    Alcohol/week: 0.0 oz  . Drug use: No  . Sexual activity: Not on file  Other Topics Concern  . Not on file  Social History Narrative  . Not on file   Interim medical history since last visit reviewed. Allergies and medications reviewed  Review of Systems Per HPI unless specifically indicated above     Objective:    BP 130/72 (BP Location: Left Arm, Patient Position: Sitting, Cuff Size: Normal)   Pulse (!) 55   Temp 98.2 F (36.8 C) (Oral)   Resp 14   Ht 5\' 4"  (1.626 m)   Wt 165 lb 1.6 oz (74.9 kg)   SpO2 98%   BMI 28.34 kg/m   Wt Readings from Last 3 Encounters:  10/08/17 165 lb 1.6 oz (74.9 kg)  09/28/17 170 lb (77.1 kg)  05/09/17 172 lb 14.4 oz (78.4 kg)    Physical Exam  Constitutional: She appears well-developed and well-nourished. No distress.  HENT:  Head: Normocephalic. Head is with contusion.  Nose: Nasal deformity present. No rhinorrhea. No epistaxis.  Mouth/Throat: Oropharynx is clear and moist and mucous membranes are normal.  Whistling sound when breathing through her nose; deformity along the right side bridge of nose; no epistaxis; bruising over both upper eyelids, left jawline  Eyes: EOM are normal. No scleral icterus.  Neck: No thyromegaly present.  Cardiovascular: Regular rhythm and normal heart sounds.  No extrasystoles are present. Bradycardia present.  No murmur heard. Pulmonary/Chest: Effort normal and breath sounds normal. No respiratory distress. She has no wheezes.  Abdominal: Soft. Bowel sounds are normal. She exhibits no distension.  Musculoskeletal:  Normal range of motion. She exhibits no edema.  Neurological: She is alert. She exhibits normal muscle tone.  Skin: Skin is warm and dry. Bruising (left jawline, over both upper eyelids) noted. She is not diaphoretic. No pallor.  Psychiatric: She has a normal mood and affect. Her behavior is normal. Judgment and thought content normal. Her mood appears not anxious. She does not exhibit a depressed mood.    Results for orders placed or performed in visit on 11/21/16  Lipid  panel  Result Value Ref Range   Cholesterol 165 <200 mg/dL   Triglycerides 409 (H) <150 mg/dL   HDL 52 >81 mg/dL   Total CHOL/HDL Ratio 3.2 <5.0 Ratio   VLDL 34 (H) <30 mg/dL   LDL Cholesterol 79 <191 mg/dL      Assessment & Plan:   Problem List Items Addressed This Visit      Cardiovascular and Mediastinum   Essential hypertension, benign    Controlled today; continue medicine      Relevant Orders   Microalbumin / creatinine urine ratio     Endocrine   IFG (impaired fasting glucose)    Check glucose and A1c; work on weight loss      Relevant Orders   Microalbumin / creatinine urine ratio   Hemoglobin A1c   Hypothyroidism    Check TSH and adjust medicine if needed      Relevant Orders   TSH     Other   Overweight (BMI 25.0-29.9)    Encouraged modest weight loss      Medication monitoring encounter    Check liver, kidney function      Relevant Orders   COMPLETE METABOLIC PANEL WITH GFR   Hypercholesteremia    Check lipids; try to reduce fatty meats, saturated fats      Relevant Orders   Lipid panel   DNAR (do not attempt resuscitation)    Discussion about end of life status; she wishes to be DNAR; does not want defibrillation, chest compression, intubation, or mechanical venitlation      Relevant Orders   DNR (Do Not Resuscitate)    Other Visit Diagnoses    Pre-operative clearance    -  Primary   Relevant Orders   CBC with Differential/Platelet   Urinalysis w microscopic +  reflex cultur   Flu vaccine need       Relevant Orders   Flu vaccine HIGH DOSE PF (Fluzone High dose) (Completed)      Follow up plan: Return in about 6 months (around 04/07/2018) for twenty minute follow-up with fasting labs; Medicare wellness when due.  An after-visit summary was printed and given to the patient at check-out.  Please see the patient instructions which may contain other information and recommendations beyond what is mentioned above in the assessment and plan.  No orders of the defined types were placed in this encounter.   Orders Placed This Encounter  Procedures  . Flu vaccine HIGH DOSE PF (Fluzone High dose)  . Microalbumin / creatinine urine ratio  . Lipid panel  . Hemoglobin A1c  . COMPLETE METABOLIC PANEL WITH GFR  . CBC with Differential/Platelet  . Urinalysis w microscopic + reflex cultur  . TSH  . DNR (Do Not Resuscitate)

## 2017-10-08 NOTE — Assessment & Plan Note (Signed)
Encouraged modest weight loss 

## 2017-10-08 NOTE — Patient Instructions (Addendum)
We'll get labs today We'll see what those show I fully anticipate clearing you for surgery once those are back, obviously as long as nothing is out of the ordinary  Try to follow the DASH guidelines (DASH stands for Dietary Approaches to Stop Hypertension) Try to limit the sodium in your diet.  Ideally, consume less than 1.5 grams (less than 1,500mg ) per day. Do not add salt when cooking or at the table.  Check the sodium amount on labels when shopping, and choose items lower in sodium when given a choice. Avoid or limit foods that already contain a lot of sodium. Eat a diet rich in fruits and vegetables and whole grains.  Check out the information at familydoctor.org entitled "Nutrition for Weight Loss: What You Need to Know about Fad Diets" Try to lose between 1-2 pounds per week by taking in fewer calories and burning off more calories You can succeed by limiting portions, limiting foods dense in calories and fat, becoming more active, and drinking 8 glasses of water a day (64 ounces) Don't skip meals, especially breakfast, as skipping meals may alter your metabolism Do not use over-the-counter weight loss pills or gimmicks that claim rapid weight loss A healthy BMI (or body mass index) is between 18.5 and 24.9 You can calculate your ideal BMI at the NIH website [employer]huhttp://www.nhlbi.nih.gov/health/educational/lose_wt/BMI/bmicalc.htm

## 2017-10-08 NOTE — Assessment & Plan Note (Signed)
Check glucose and A1c; work on weight loss 

## 2017-10-08 NOTE — Assessment & Plan Note (Signed)
Discussion about end of life status; she wishes to be DNAR; does not want defibrillation, chest compression, intubation, or mechanical venitlation

## 2017-10-08 NOTE — Assessment & Plan Note (Signed)
Check lipids; try to reduce fatty meats, saturated fats

## 2017-10-09 ENCOUNTER — Other Ambulatory Visit: Payer: Self-pay | Admitting: Family Medicine

## 2017-10-09 ENCOUNTER — Other Ambulatory Visit: Payer: Self-pay

## 2017-10-09 DIAGNOSIS — N179 Acute kidney failure, unspecified: Secondary | ICD-10-CM | POA: Insufficient documentation

## 2017-10-09 DIAGNOSIS — N183 Chronic kidney disease, stage 3 unspecified: Secondary | ICD-10-CM | POA: Insufficient documentation

## 2017-10-09 DIAGNOSIS — R809 Proteinuria, unspecified: Secondary | ICD-10-CM

## 2017-10-09 LAB — COMPLETE METABOLIC PANEL WITH GFR
AG RATIO: 1.4 (calc) (ref 1.0–2.5)
ALT: 13 U/L (ref 6–29)
AST: 19 U/L (ref 10–35)
Albumin: 4.3 g/dL (ref 3.6–5.1)
Alkaline phosphatase (APISO): 67 U/L (ref 33–130)
BILIRUBIN TOTAL: 0.4 mg/dL (ref 0.2–1.2)
BUN/Creatinine Ratio: 18 (calc) (ref 6–22)
BUN: 20 mg/dL (ref 7–25)
CALCIUM: 10.2 mg/dL (ref 8.6–10.4)
CHLORIDE: 103 mmol/L (ref 98–110)
CO2: 27 mmol/L (ref 20–32)
Creat: 1.12 mg/dL — ABNORMAL HIGH (ref 0.60–0.93)
GFR, EST AFRICAN AMERICAN: 54 mL/min/{1.73_m2} — AB (ref >=60)
GFR, EST NON AFRICAN AMERICAN: 47 mL/min/{1.73_m2} — AB (ref >=60)
GLOBULIN: 3.1 g/dL (ref 1.9–3.7)
Glucose, Bld: 85 mg/dL (ref 65–99)
POTASSIUM: 4.6 mmol/L (ref 3.5–5.3)
SODIUM: 137 mmol/L (ref 135–146)
TOTAL PROTEIN: 7.4 g/dL (ref 6.1–8.1)

## 2017-10-09 LAB — CBC WITH DIFFERENTIAL/PLATELET
BASOS ABS: 97 {cells}/uL (ref 0–200)
Basophils Relative: 1.2 %
EOS ABS: 113 {cells}/uL (ref 15–500)
Eosinophils Relative: 1.4 %
HCT: 34.8 % — ABNORMAL LOW (ref 35.0–45.0)
Hemoglobin: 11.5 g/dL — ABNORMAL LOW (ref 11.7–15.5)
Lymphs Abs: 1353 cells/uL (ref 850–3900)
MCH: 29.1 pg (ref 27.0–33.0)
MCHC: 33 g/dL (ref 32.0–36.0)
MCV: 88.1 fL (ref 80.0–100.0)
MONOS PCT: 7.7 %
MPV: 11.2 fL (ref 7.5–12.5)
NEUTROS PCT: 73 %
Neutro Abs: 5913 cells/uL (ref 1500–7800)
Platelets: 274 10*3/uL (ref 140–400)
RBC: 3.95 10*6/uL (ref 3.80–5.10)
RDW: 12.6 % (ref 11.0–15.0)
TOTAL LYMPHOCYTE: 16.7 %
WBC mixed population: 624 cells/uL (ref 200–950)
WBC: 8.1 10*3/uL (ref 3.8–10.8)

## 2017-10-09 LAB — LIPID PANEL
CHOL/HDL RATIO: 3 (calc) (ref <5.0)
CHOLESTEROL: 150 mg/dL (ref <200)
HDL: 50 mg/dL — AB (ref >50)
LDL Cholesterol (Calc): 73 mg/dL (calc)
Non-HDL Cholesterol (Calc): 100 mg/dL (calc) (ref <130)
TRIGLYCERIDES: 163 mg/dL — AB (ref <150)

## 2017-10-09 LAB — URINALYSIS W MICROSCOPIC + REFLEX CULTURE
BILIRUBIN URINE: NEGATIVE
Bacteria, UA: NONE SEEN /HPF
GLUCOSE, UA: NEGATIVE
HGB URINE DIPSTICK: NEGATIVE
Hyaline Cast: NONE SEEN /LPF
Ketones, ur: NEGATIVE
LEUKOCYTE ESTERASE: NEGATIVE
NITRITES URINE, INITIAL: NEGATIVE
RBC / HPF: NONE SEEN /HPF (ref 0–2)
Specific Gravity, Urine: 1.004 (ref 1.001–1.03)
Squamous Epithelial / LPF: NONE SEEN /HPF (ref <=5)
WBC UA: NONE SEEN /HPF (ref 0–5)
pH: 6.5 (ref 5.0–8.0)

## 2017-10-09 LAB — HEMOGLOBIN A1C
Hgb A1c MFr Bld: 5.5 % of total Hgb (ref <5.7)
Mean Plasma Glucose: 111 (calc)
eAG (mmol/L): 6.2 (calc)

## 2017-10-09 LAB — TSH: TSH: 2.56 mIU/L (ref 0.40–4.50)

## 2017-10-09 LAB — NO CULTURE INDICATED

## 2017-10-09 LAB — MICROALBUMIN / CREATININE URINE RATIO
Creatinine, Urine: 23 mg/dL (ref 20–275)
Microalb Creat Ratio: 578 mcg/mg creat — ABNORMAL HIGH (ref <30)
Microalb, Ur: 13.3 mg/dL

## 2017-10-09 MED ORDER — HYDROCHLOROTHIAZIDE 25 MG PO TABS: 25.0000 mg | ORAL_TABLET | Freq: Every day | ORAL | 1 refills | Status: DC

## 2017-10-09 NOTE — Assessment & Plan Note (Signed)
Refer to nephro 

## 2017-10-09 NOTE — Progress Notes (Signed)
Refer to nephro 

## 2017-10-10 ENCOUNTER — Telehealth: Payer: Self-pay

## 2017-10-10 ENCOUNTER — Telehealth: Payer: Self-pay | Admitting: Family Medicine

## 2017-10-10 ENCOUNTER — Ambulatory Visit: Payer: Medicare Other | Admitting: Anesthesiology

## 2017-10-10 ENCOUNTER — Encounter: Payer: Self-pay | Admitting: Family Medicine

## 2017-10-10 ENCOUNTER — Encounter
Admission: RE | Disposition: A | Discharge status: Home / Self Care | Payer: Self-pay | Source: Ambulatory Visit | Attending: Otolaryngology

## 2017-10-10 ENCOUNTER — Ambulatory Visit
Admission: RE | Admit: 2017-10-10 | Discharge: 2017-10-10 | Disposition: A | Discharge status: Home / Self Care | Payer: Medicare Other | Source: Ambulatory Visit | Attending: Otolaryngology | Admitting: Otolaryngology

## 2017-10-10 DIAGNOSIS — X58XXXA Exposure to other specified factors, initial encounter: Secondary | ICD-10-CM | POA: Diagnosis not present

## 2017-10-10 DIAGNOSIS — Y9289 Other specified places as the place of occurrence of the external cause: Secondary | ICD-10-CM | POA: Insufficient documentation

## 2017-10-10 DIAGNOSIS — J3489 Other specified disorders of nose and nasal sinuses: Secondary | ICD-10-CM | POA: Diagnosis not present

## 2017-10-10 DIAGNOSIS — J342 Deviated nasal septum: Secondary | ICD-10-CM | POA: Diagnosis not present

## 2017-10-10 DIAGNOSIS — I739 Peripheral vascular disease, unspecified: Secondary | ICD-10-CM | POA: Insufficient documentation

## 2017-10-10 DIAGNOSIS — S022XXA Fracture of nasal bones, initial encounter for closed fracture: Secondary | ICD-10-CM | POA: Insufficient documentation

## 2017-10-10 DIAGNOSIS — E039 Hypothyroidism, unspecified: Secondary | ICD-10-CM | POA: Diagnosis not present

## 2017-10-10 DIAGNOSIS — I10 Essential (primary) hypertension: Secondary | ICD-10-CM | POA: Diagnosis not present

## 2017-10-10 DIAGNOSIS — Z79899 Other long term (current) drug therapy: Secondary | ICD-10-CM | POA: Diagnosis not present

## 2017-10-10 HISTORY — PX: CLOSED REDUCTION NASAL FRACTURE: SHX5365

## 2017-10-10 SURGERY — CLOSED REDUCTION, FRACTURE, NASAL BONE
Anesthesia: General

## 2017-10-10 MED ORDER — PROPOFOL 10 MG/ML IV BOLUS
INTRAVENOUS | Status: DC | PRN
  Administered 2017-10-10: 150 mg via INTRAVENOUS
  Administered 2017-10-10: 50 mg via INTRAVENOUS

## 2017-10-10 MED ORDER — ONDANSETRON HCL 4 MG/2ML IJ SOLN
INTRAMUSCULAR | Status: AC
  Filled 2017-10-10: qty 2

## 2017-10-10 MED ORDER — EPHEDRINE SULFATE 50 MG/ML IJ SOLN
INTRAMUSCULAR | Status: DC | PRN
  Administered 2017-10-10 (×3): 10 mg via INTRAVENOUS

## 2017-10-10 MED ORDER — PROPOFOL 10 MG/ML IV BOLUS
INTRAVENOUS | Status: AC
  Filled 2017-10-10: qty 40

## 2017-10-10 MED ORDER — EPHEDRINE SULFATE 50 MG/ML IJ SOLN
INTRAMUSCULAR | Status: AC
  Filled 2017-10-10: qty 1

## 2017-10-10 MED ORDER — FAMOTIDINE 20 MG PO TABS
20.0000 mg | ORAL_TABLET | Freq: Once | ORAL | Status: AC
  Administered 2017-10-10: 20 mg via ORAL

## 2017-10-10 MED ORDER — LACTATED RINGERS IV SOLN
INTRAVENOUS | Status: DC
  Administered 2017-10-10 (×2): via INTRAVENOUS

## 2017-10-10 MED ORDER — DEXAMETHASONE SODIUM PHOSPHATE 10 MG/ML IJ SOLN
INTRAMUSCULAR | Status: AC
  Filled 2017-10-10: qty 1

## 2017-10-10 MED ORDER — FENTANYL CITRATE (PF) 100 MCG/2ML IJ SOLN
INTRAMUSCULAR | Status: AC
  Filled 2017-10-10: qty 2

## 2017-10-10 MED ORDER — ONDANSETRON HCL 4 MG PO TABS: 4.0000 mg | ORAL_TABLET | Freq: Three times a day (TID) | ORAL | 0 refills | Status: DC | PRN

## 2017-10-10 MED ORDER — FENTANYL CITRATE (PF) 100 MCG/2ML IJ SOLN: 25.0000 ug | INTRAMUSCULAR | Status: DC | PRN

## 2017-10-10 MED ORDER — GLYCOPYRROLATE 0.2 MG/ML IJ SOLN
INTRAMUSCULAR | Status: DC | PRN
  Administered 2017-10-10: 0.2 mg via INTRAVENOUS

## 2017-10-10 MED ORDER — FAMOTIDINE 20 MG PO TABS
ORAL_TABLET | ORAL | Status: AC
  Filled 2017-10-10: qty 1

## 2017-10-10 MED ORDER — GLYCOPYRROLATE 0.2 MG/ML IJ SOLN
INTRAMUSCULAR | Status: AC
  Filled 2017-10-10: qty 1

## 2017-10-10 MED ORDER — FENTANYL CITRATE (PF) 100 MCG/2ML IJ SOLN
INTRAMUSCULAR | Status: DC | PRN
  Administered 2017-10-10: 50 ug via INTRAVENOUS

## 2017-10-10 MED ORDER — MIDAZOLAM HCL 2 MG/2ML IJ SOLN
INTRAMUSCULAR | Status: DC | PRN
  Administered 2017-10-10: 1 mg via INTRAVENOUS

## 2017-10-10 MED ORDER — MIDAZOLAM HCL 2 MG/2ML IJ SOLN
INTRAMUSCULAR | Status: AC
  Filled 2017-10-10: qty 2

## 2017-10-10 MED ORDER — OXYMETAZOLINE HCL 0.05 % NA SOLN
NASAL | Status: DC | PRN
  Administered 2017-10-10: 1 via TOPICAL

## 2017-10-10 MED ORDER — LIDOCAINE HCL (CARDIAC) 20 MG/ML IV SOLN
INTRAVENOUS | Status: DC | PRN
  Administered 2017-10-10: 100 mg via INTRAVENOUS

## 2017-10-10 MED ORDER — ONDANSETRON HCL 4 MG/2ML IJ SOLN: 4.0000 mg | Freq: Once | INTRAMUSCULAR | Status: DC | PRN

## 2017-10-10 MED ORDER — DEXAMETHASONE SODIUM PHOSPHATE 10 MG/ML IJ SOLN
INTRAMUSCULAR | Status: DC | PRN
  Administered 2017-10-10: 10 mg via INTRAVENOUS

## 2017-10-10 MED ORDER — OXYMETAZOLINE HCL 0.05 % NA SOLN
NASAL | Status: AC
  Filled 2017-10-10: qty 15

## 2017-10-10 MED ORDER — ONDANSETRON HCL 4 MG/2ML IJ SOLN
INTRAMUSCULAR | Status: DC | PRN
Start: 2017-10-10 — End: 2017-10-10
  Administered 2017-10-10: 4 mg via INTRAVENOUS

## 2017-10-10 SURGICAL SUPPLY — 20 items
ADHESIVE MASTISOL STRL (MISCELLANEOUS) ×3 IMPLANT
CANISTER SUCT 1200ML W/VALVE (MISCELLANEOUS) ×3 IMPLANT
CLOSURE WOUND 1/2 X4 (GAUZE/BANDAGES/DRESSINGS) ×1
CLOSURE WOUND 1/4X4 (GAUZE/BANDAGES/DRESSINGS)
CNTNR SPEC 2.5X3XGRAD LEK (MISCELLANEOUS) ×1
COAG SUCT 10F 3.5MM HAND CTRL (MISCELLANEOUS) ×3 IMPLANT
CONT SPEC 4OZ STER OR WHT (MISCELLANEOUS) ×2
CONTAINER SPEC 2.5X3XGRAD LEK (MISCELLANEOUS) ×1 IMPLANT
CUP MEDICINE 2OZ PLAST GRAD ST (MISCELLANEOUS) ×3 IMPLANT
ELECT REM PT RETURN 9FT ADLT (ELECTROSURGICAL) ×3
ELECTRODE REM PT RTRN 9FT ADLT (ELECTROSURGICAL) ×1 IMPLANT
GAUZE SPONGE 4X4 12PLY STRL (GAUZE/BANDAGES/DRESSINGS) ×3 IMPLANT
GLOVE BIO SURGEON STRL SZ7.5 (GLOVE) ×3 IMPLANT
GOWN STRL REUS W/ TWL LRG LVL3 (GOWN DISPOSABLE) ×2 IMPLANT
GOWN STRL REUS W/TWL LRG LVL3 (GOWN DISPOSABLE) ×4
SPONGE NEURO XRAY DETECT 1X3 (DISPOSABLE) ×3 IMPLANT
STRIP CLOSURE SKIN 1/2X4 (GAUZE/BANDAGES/DRESSINGS) ×2 IMPLANT
STRIP CLOSURE SKIN 1/4X4 (GAUZE/BANDAGES/DRESSINGS) IMPLANT
TUBING CONNECTING 10 (TUBING) ×2 IMPLANT
TUBING CONNECTING 10' (TUBING) ×1

## 2017-10-10 NOTE — Telephone Encounter (Signed)
Copied from CRM [phone]. Topic: General - Other >> Oct 10, 2017  4:13 PM Cipriano [name], Janice S wrote: Reason for CRM:  returning call - lab results - no crm please call her back.

## 2017-10-10 NOTE — Discharge Instructions (Signed)

## 2017-10-10 NOTE — Anesthesia Postprocedure Evaluation (Signed)
Anesthesia Post Note  Patient: Janice [name]  Procedure(s) Performed: CLOSED REDUCTION NASAL BONE FRACTURE (N/A )  Patient location during evaluation: PACU Anesthesia Type: General Level of consciousness: awake and alert Pain management: pain level controlled Vital Signs Assessment: post-procedure vital signs reviewed and stable Respiratory status: spontaneous breathing and respiratory function stable Cardiovascular status: stable Anesthetic complications: no     Last Vitals:  Vitals:   10/10/17 0935 10/10/17 0938  BP: (!) 88/55 105/65  Pulse: 91 92  Resp: 12 13  Temp: 36.5 C   SpO2: 100% 100%    Last Pain:  Vitals:   10/10/17 0731  TempSrc: Temporal                 KEPHART,WILLIAM K

## 2017-10-10 NOTE — Anesthesia Procedure Notes (Signed)
Procedure Name: LMA Insertion Date/Time: 10/10/2017 8:57 AM Performed by: Michaele OfferSavage, Breona Cherubin, CRNA Pre-anesthesia Checklist: Patient identified, Emergency Drugs available, Suction available, Patient being monitored and Timeout performed Patient Re-evaluated:Patient Re-evaluated prior to induction Oxygen Delivery Method: Circle system utilized Preoxygenation: Pre-oxygenation with 100% oxygen Induction Type: IV induction Ventilation: Mask ventilation without difficulty LMA: LMA inserted LMA Size: 4.0 Number of attempts: 1 Placement Confirmation: positive ETCO2 and breath sounds checked- equal and bilateral Tube secured with: Tape Dental Injury: Teeth and Oropharynx as per pre-operative assessment

## 2017-10-10 NOTE — Anesthesia Post-op Follow-up Note (Signed)
Anesthesia QCDR form completed.        

## 2017-10-10 NOTE — Telephone Encounter (Signed)
Janice [name], also let pt know that she is spilling protein through her kidneys so we want her to see a kidney specialist; this will not keep her from having surgery, but it is something that needs to be evaluated and looked at; her 3 month blood sugar average is better; thank you     Called pt no answer. Unable to leave message as line dropped. Will call again.

## 2017-10-10 NOTE — Transfer of Care (Signed)
Immediate Anesthesia Transfer of Care Note  Patient: Janice ChesterfieldLinda L [name]  Procedure(s) Performed: CLOSED REDUCTION NASAL BONE FRACTURE (N/A )  Patient Location: PACU  Anesthesia Type:General  Level of Consciousness: awake, oriented and patient cooperative  Airway & Oxygen Therapy: Patient Spontanous Breathing and Patient connected to nasal cannula oxygen  Post-op Assessment: Report given to RN, Post -op Vital signs reviewed and stable and Patient moving all extremities X 4  Post vital signs: Reviewed and stable  Last Vitals:  Vitals:   10/10/17 0731 10/10/17 0935  BP: (!) 165/53 (!) 88/55  Pulse: (!) 55 91  Resp: 14 12  Temp: 36.6 C 36.5 C  SpO2: 100% 100%    Last Pain:  Vitals:   10/10/17 0731  TempSrc: Temporal         Complications: No apparent anesthesia complications

## 2017-10-10 NOTE — Op Note (Signed)
..  10/10/2017  9:23 AM    Janice [name], Janice [name]  [id number]   Pre-Op Dx:  NASAL FRACTURE,NASAL OBSTRUCTION  Post-op Dx: NASAL FRACTURE,NASAL OBSTRUCTION  Proc:Closed reduction of nasal bone fracture  Surg: [facility]  Anes:  General by LMA  EBL:  None  Comp:  None  Findings:  Rightward deviation of nasal septum with convexity on right and concavity on left.  S-shaped septal deviation due to comminuted nasal bone fracture.  Procedure: With the patient in a comfortable supine position, general laryngeal mask anesthesia was administered.  At an appropriate level, afrin was placed into the patient's nasal cavity.  Evaluation of the patient's nose revealed rightward deviation of the nasal bridge and a concavity of the left nasal sidewall.  Using an elevator, the nasal bones were moved to the left.  This demonstrated in improvement but continued convexity of the right nasal side wall.  Using a small punch, the convexity was reduced.  Evaluation of the nasal cavity revealed continued septal deviation but improvement of nasal airway.  With the nasal bridge now midline, steri-strips were placed along with a fashioned thermaplast splint.    Following this  The patient was returned to anesthesia, awakened, and transferred to recovery in stable condition.  Dispo:  PACU to home  Plan: Recheck my office in one week.   Janice [name] 9:23 AM 10/10/2017

## 2017-10-10 NOTE — Anesthesia Preprocedure Evaluation (Signed)
Anesthesia Evaluation  Patient identified by MRN, date of birth, ID band Patient awake    Reviewed: Allergy & Precautions, NPO status , Patient's Chart, lab work & pertinent test results  History of Anesthesia Complications Negative for: history of anesthetic complications  Airway Mallampati: III       Dental  (+) Lower Dentures, Upper Dentures   Pulmonary neg sleep apnea, neg COPD, former smoker,           Cardiovascular hypertension, Pt. on medications + Peripheral Vascular Disease  (-) Past MI and (-) CHF (-) dysrhythmias (-) Valvular Problems/Murmurs     Neuro/Psych neg Seizures    GI/Hepatic Neg liver ROS, neg GERD  ,  Endo/Other  neg diabetesHypothyroidism   Renal/GU Renal InsufficiencyRenal disease     Musculoskeletal   Abdominal   Peds  Hematology   Anesthesia Other Findings   Reproductive/Obstetrics                             Anesthesia Physical Anesthesia Plan  ASA: III  Anesthesia Plan: General   Post-op Pain Management:    Induction: Intravenous  PONV Risk Score and Plan: 3 and Dexamethasone, Ondansetron, Midazolam and Treatment may vary due to age or medical condition  Airway Management Planned: Oral ETT  Additional Equipment:   Intra-op Plan:   Post-operative Plan:   Informed Consent: I have reviewed the patients History and Physical, chart, labs and discussed the procedure including the risks, benefits and alternatives for the proposed anesthesia with the patient or authorized representative who has indicated his/her understanding and acceptance.     Plan Discussed with:   Anesthesia Plan Comments:         Anesthesia Quick Evaluation

## 2017-10-10 NOTE — H&P (Signed)
..  History and Physical paper copy reviewed and updated date of procedure and will be scanned into system.  Patient seen and examined.  

## 2017-10-10 NOTE — Telephone Encounter (Signed)
-----   Message from Kerman PasseyMelinda P Lada, MD sent at 10/08/2017  6:37 PM EST ----- Idamae Schullerkie, please let patient know that her LDL is very close to ideal; we'd like it under 70, and her level is 73; I think if she lost just 5-8 pounds and ate a little better, she would get there on her own without adjusting medicines; her kidney function is stable; she has mild anemia which appears stable; her kidney function is not as good as it was when she was 76 years old, but it's also pretty stable; avoid NSAIDs and stay well-hydrated; thank you

## 2017-10-11 NOTE — Telephone Encounter (Signed)
Patient notified of labs, but wants you to know she is seeing a kidney specialist Dr. Wynelle LinkKolluru already

## 2017-10-14 NOTE — Telephone Encounter (Signed)
Called pt informed her of all recent lab results. Pt gave verbal understanding. Pt states she has appt already scheduled w/ kidney specialist.

## 2017-10-24 ENCOUNTER — Other Ambulatory Visit: Payer: Self-pay | Admitting: Family Medicine

## 2017-11-01 ENCOUNTER — Telehealth: Payer: Self-pay | Admitting: Family Medicine

## 2017-11-01 DIAGNOSIS — E039 Hypothyroidism, unspecified: Secondary | ICD-10-CM

## 2017-11-01 NOTE — Telephone Encounter (Signed)
Copied from CRM [phone]. Topic: Quick Communication - See Telephone Encounter >> Nov 01, 2017  4:08 PM Cipriano BunkerLambe, Annette S wrote: CRM for notification. See Telephone encounter for:  Prescription Levothyroxine, pharmacist is asking if can do a different manufacture. [place] brand they have, [facility] is on backorder [facility] [phone]  11/01/17.

## 2017-11-04 NOTE — Telephone Encounter (Signed)
Left voicemail with pharmacy.  Pt notified

## 2017-11-04 NOTE — Telephone Encounter (Signed)
Pharmacist asking if a different manufacture could be used for Levothyroxine due to [employer] being on backorder. They currently have the [place] brand.

## 2017-11-04 NOTE — Telephone Encounter (Signed)
I really appreciate the pharmacy letting me know they have to switch manufacturer They may use the new manufacturer's medicine, and we'll ask the patient to come get her TSH rechecked 6-8 weeks after she starts the new medicine Thank you

## 2017-11-17 ENCOUNTER — Other Ambulatory Visit: Payer: Self-pay | Admitting: Family Medicine

## 2017-11-18 NOTE — Telephone Encounter (Signed)
Nov 2018 SGPT and lipids reviewed Rx approved

## 2018-01-16 NOTE — Progress Notes (Signed)
Closing out scanned document encounter

## 2018-01-16 NOTE — Telephone Encounter (Signed)
Closing out this message which was sent to CMA over a year ago  Routing History   Priority Sent On From To Message Type   12/21/2016 1:26 PM Lada, Janit BernMelinda P, MD Kipp LaurenceParrish, Amber [place]Nichole, CMA    12/21/2016 12:37 AM Interface, Surescripts Out P MELINDA LADA RX REFILL     Signing off

## 2018-01-16 NOTE — Progress Notes (Signed)
Closing out lab/order note open since:  Dec 20. 2017

## 2018-01-16 NOTE — Progress Notes (Signed)
Closing out lab/order note open since:  June 2017 

## 2018-01-27 ENCOUNTER — Other Ambulatory Visit: Payer: Self-pay

## 2018-01-27 DIAGNOSIS — E039 Hypothyroidism, unspecified: Secondary | ICD-10-CM

## 2018-01-29 ENCOUNTER — Other Ambulatory Visit: Payer: Self-pay

## 2018-01-29 DIAGNOSIS — E039 Hypothyroidism, unspecified: Secondary | ICD-10-CM | POA: Diagnosis not present

## 2018-01-29 MED ORDER — LEVOTHYROXINE SODIUM 112 MCG PO TABS: 112.0000 ug | ORAL_TABLET | Freq: Every day | ORAL | 2 refills | Status: DC

## 2018-01-29 NOTE — Telephone Encounter (Signed)
90 day

## 2018-01-29 NOTE — Telephone Encounter (Signed)
Lab Results  Component Value Date   TSH 2.56 10/08/2017   Rx approved

## 2018-01-30 LAB — TSH: TSH: 2.86 m[IU]/L (ref 0.40–4.50)

## 2018-03-01 ENCOUNTER — Other Ambulatory Visit: Payer: Self-pay | Admitting: Family Medicine

## 2018-04-02 ENCOUNTER — Other Ambulatory Visit: Payer: Self-pay | Admitting: Family Medicine

## 2018-04-03 ENCOUNTER — Telehealth: Payer: Self-pay

## 2018-04-03 NOTE — Telephone Encounter (Signed)
Does not appear pt has completed an AWV. Called to schedule appt. LVM requesting returned call. 

## 2018-04-07 ENCOUNTER — Encounter: Payer: Self-pay | Admitting: Family Medicine

## 2018-04-07 ENCOUNTER — Ambulatory Visit (INDEPENDENT_AMBULATORY_CARE_PROVIDER_SITE_OTHER): Payer: Medicare Other | Admitting: Family Medicine

## 2018-04-07 VITALS — BP 126/82 | HR 66 | Temp 98.2°F | Resp 16 | Ht 64.0 in | Wt 172.6 lb

## 2018-04-07 DIAGNOSIS — R7301 Impaired fasting glucose: Secondary | ICD-10-CM

## 2018-04-07 DIAGNOSIS — Z5181 Encounter for therapeutic drug level monitoring: Secondary | ICD-10-CM

## 2018-04-07 DIAGNOSIS — E7800 Pure hypercholesterolemia, unspecified: Secondary | ICD-10-CM | POA: Diagnosis not present

## 2018-04-07 DIAGNOSIS — N183 Chronic kidney disease, stage 3 unspecified: Secondary | ICD-10-CM

## 2018-04-07 DIAGNOSIS — I10 Essential (primary) hypertension: Secondary | ICD-10-CM

## 2018-04-07 DIAGNOSIS — E039 Hypothyroidism, unspecified: Secondary | ICD-10-CM

## 2018-04-07 DIAGNOSIS — B353 Tinea pedis: Secondary | ICD-10-CM | POA: Diagnosis not present

## 2018-04-07 DIAGNOSIS — D649 Anemia, unspecified: Secondary | ICD-10-CM | POA: Diagnosis not present

## 2018-04-07 DIAGNOSIS — E663 Overweight: Secondary | ICD-10-CM

## 2018-04-07 DIAGNOSIS — I739 Peripheral vascular disease, unspecified: Secondary | ICD-10-CM

## 2018-04-07 NOTE — Assessment & Plan Note (Signed)
Stable on thyroid medicine; next TSH due Feb 2020

## 2018-04-07 NOTE — Patient Instructions (Addendum)
Try the tolnaftate cream over-the-counter If not improving significantly in a week or two, let me know and we'll get you to a dermatologist Return the stool cards when you can We'll get labs today If you have not heard anything from my staff in a week about any orders/referrals/studies from today, please contact us here to follow-up [phone] I'll recommend vitamin B12 500 mcg daily

## 2018-04-07 NOTE — Assessment & Plan Note (Signed)
Check CBC, ferritin, B12, etc; stool cards given for patient to return; she refuses colonoscopy

## 2018-04-07 NOTE — Assessment & Plan Note (Signed)
Patient will work on this over the nicer warmer months

## 2018-04-07 NOTE — Assessment & Plan Note (Signed)
Check liver and kidneys 

## 2018-04-07 NOTE — Progress Notes (Signed)
BP 126/82   Pulse 66   Temp 98.2 F (36.8 C) (Oral)   Resp 16   Ht  (1.626 m)   Wt 172 lb 9.6 oz (78.3 kg)   SpO2 97%   BMI 29.63 kg/m    Subjective:    Patient ID: Janice [name], female    DOB: 1941-04-05, 77 y.o.   MRN: [id number]  HPI: Janice [name] is a 77 y.o. female  Chief Complaint  Patient presents with  . Follow-up    6 months    HPI Here for 6 months f/u; no excitement since last visit Hypertension; well-controlled on CCB, beta-blocker, ARB, thiazide Numbers go up and down when she checks; not eating much salty food; tries to get low sodium or no added salt; her grandmother had HTN and never cooked with salt  High cholesterol; on statin; not eating bacon at all; does love cheese; does eat oatmeal for breakfast; no myalgias on the statin  Hypothyroidism; been on thyroid medicine for a long time; weight up a few pounds over the winter; energy level is stable; no constipation, no hair loss; last TSH normal Jan 29, 2018 2.86, next due Feb 2020  On aspirin; no bleeding  Mild anemia; eats some kale, once in a while eats spinach; no lentils; no blood in the stool  Hx of prediabetes; last A1c was 5.5; tries to avoid sweets  PVD; saw vascular doctor; had intervention; does not want to go back; no aching in the legs with walking; feet do not get cold or blue  CKD stage 3; avoiding NSAID, "I only take Tylenol when I have to"; seeing kidney doctor next month  Depression screen [facility] 2/9 04/07/2018 10/08/2017 05/09/2017 11/21/2016 05/22/2016  Decreased Interest 0 0 0 0 0  Down, Depressed, Hopeless 0 0 0 0 0  PHQ - 2 Score 0 0 0 0 0    Relevant past medical, surgical, family and social history reviewed Past Medical History:  Diagnosis Date  . Chronic kidney disease   . Hernia, ventral   . Hyperlipidemia   . Hypertension   . Subclavian arterial stenosis (HCC)   . Thyroid disease    Past Surgical History:  Procedure Laterality Date  . artery bypass rechanneling   2010  . CHOLECYSTECTOMY    . CLOSED REDUCTION NASAL FRACTURE N/A 10/10/2017   Procedure: CLOSED REDUCTION NASAL BONE FRACTURE;  Surgeon: [name], MD;  Location: [facility] ORS;  Service: ENT;  Laterality: N/A;  . HERNIA REPAIR  03/05/2016   15 x 22 cm retrorectus [facility] mesh repair.  . INSERTION OF MESH N/A 03/05/2016   Procedure: INSERTION OF MESH;  Surgeon: [name], MD;  Location: [facility] ORS;  Service: General;  Laterality: N/A;  . VENTRAL HERNIA REPAIR N/A 03/05/2016   Procedure: HERNIA REPAIR VENTRAL ADULT;  Surgeon: [name], MD;  Location: [facility] ORS;  Service: General;  Laterality: N/A;   Family History  Problem Relation Age of Onset  . Heart disease Mother   . Hypertension Mother   . Heart disease Father   . Hypertension Father   . Hypertension Sister   . Diabetes Sister   . Cancer Neg Hx   . COPD Neg Hx   . Stroke Neg Hx    Social History   Tobacco Use  . Smoking status: Former Smoker    Types: Cigarettes    Last attempt to quit: 12/03/2008    Years since quitting: 9.3  . Smokeless tobacco:  Never Used  Substance Use Topics  . Alcohol use: No    Alcohol/week: 0.0 oz  . Drug use: No    Interim medical history since last visit reviewed. Allergies and medications reviewed  Review of Systems Per HPI unless specifically indicated above     Objective:    BP 126/82   Pulse 66   Temp 98.2 F (36.8 C) (Oral)   Resp 16   Ht  (1.626 m)   Wt 172 lb 9.6 oz (78.3 kg)   SpO2 97%   BMI 29.63 kg/m   Wt Readings from Last 3 Encounters:  04/07/18 172 lb 9.6 oz (78.3 kg)  10/10/17 165 lb (74.8 kg)  10/08/17 165 lb 1.6 oz (74.9 kg)    Physical Exam  Constitutional: She appears well-developed and well-nourished. No distress.  HENT:  Head: Normocephalic and atraumatic.  Eyes: EOM are normal. No scleral icterus.  Neck: No thyromegaly present.  Cardiovascular: Normal rate, regular rhythm and normal heart sounds.  No murmur heard. Pulmonary/Chest:  Effort normal and breath sounds normal. No respiratory distress. She has no wheezes.  Abdominal: Soft. Bowel sounds are normal. She exhibits no distension.  Musculoskeletal: Normal range of motion. She exhibits no edema.  Neurological: She is alert. She exhibits normal muscle tone.  Skin: Skin is warm and dry. Rash (distal feet, erythematous with scaling; thick toenails) noted. She is not diaphoretic. No pallor.  Palmar erythema  Psychiatric: She has a normal mood and affect. Her behavior is normal. Judgment and thought content normal. Her mood appears not anxious. She does not exhibit a depressed mood.    Results for orders placed or performed in visit on 01/27/18  TSH  Result Value Ref Range   TSH 2.86 0.40 - 4.50 mIU/L      Assessment & Plan:   Problem List Items Addressed This Visit      Cardiovascular and Mediastinum   Peripheral vascular disease (HCC)    Was seeing vascular doctor in [place]; pt politely declined referral; check lipids today      Essential hypertension, benign - Primary    Excellent control; continue medicines; glad she is not cooking with salt        Endocrine   IFG (impaired fasting glucose)    Check A1c; glad to hear that she is not eating a lot of sweets; she'll work on weight loss      Relevant Orders   Hemoglobin A1c   Hypothyroidism    Stable on thyroid medicine; next TSH due Feb 2020        Genitourinary   CKD (chronic kidney disease) stage 3, GFR 30-59 ml/min (HCC)    Seeing nephrologist next month; avoiding NSAIDs; hydrate      Relevant Orders   COMPLETE METABOLIC PANEL WITH GFR     Other   Overweight (BMI 25.0-29.9)    Patient will work on this over the nicer warmer months      Medication monitoring encounter    Check liver and kidneys      Relevant Orders   COMPLETE METABOLIC PANEL WITH GFR   Hypercholesteremia    Limit cheese and other saturated fats; check lipids today; goal LDL is less than 70      Relevant  Orders   Lipid panel   Anemia    Check CBC, ferritin, B12, etc; stool cards given for patient to return; she refuses colonoscopy      Relevant Orders   CBC with Differential/Platelet  Fe+TIBC+Fer    Other Visit Diagnoses    Tinea pedis of both feet       trial of tolnaftate OTC; if not significantly improving in one to two weeks, let me know and we'll have her see derm       Follow up plan: Return in about 6 months (around 10/08/2018) for follow-up visit with Dr. Sherie Don; Medicare Wellness visit with [facility].  An after-visit summary was printed and given to the patient at check-out.  Please see the patient instructions which may contain other information and recommendations beyond what is mentioned above in the assessment and plan.  No orders of the defined types were placed in this encounter.   Orders Placed This Encounter  Procedures  . Hemoglobin A1c  . Lipid panel  . COMPLETE METABOLIC PANEL WITH GFR  . CBC with Differential/Platelet  . Fe+TIBC+Fer

## 2018-04-07 NOTE — Assessment & Plan Note (Signed)
Seeing nephrologist next month; avoiding NSAIDs; hydrate

## 2018-04-07 NOTE — Assessment & Plan Note (Signed)
Check A1c; glad to hear that she is not eating a lot of sweets; she'll work on weight loss

## 2018-04-07 NOTE — Assessment & Plan Note (Signed)
Was seeing vascular doctor in [place]; pt politely declined referral; check lipids today

## 2018-04-07 NOTE — Assessment & Plan Note (Signed)
Limit cheese and other saturated fats; check lipids today; goal LDL is less than 70

## 2018-04-07 NOTE — Assessment & Plan Note (Signed)
Excellent control; continue medicines; glad she is not cooking with salt

## 2018-04-08 ENCOUNTER — Telehealth: Payer: Self-pay

## 2018-04-08 ENCOUNTER — Other Ambulatory Visit: Payer: Self-pay | Admitting: Family Medicine

## 2018-04-08 DIAGNOSIS — E7800 Pure hypercholesterolemia, unspecified: Secondary | ICD-10-CM

## 2018-04-08 LAB — IRON,TIBC AND FERRITIN PANEL
%SAT: 21 % (calc) (ref 11–50)
Ferritin: 57 ng/mL (ref 20–288)
IRON: 77 ug/dL (ref 45–160)
TIBC: 363 mcg/dL (calc) (ref 250–450)

## 2018-04-08 LAB — CBC WITH DIFFERENTIAL/PLATELET
BASOS ABS: 90 {cells}/uL (ref 0–200)
Basophils Relative: 1.1 %
Eosinophils Absolute: 98 cells/uL (ref 15–500)
Eosinophils Relative: 1.2 %
HCT: 36.6 % (ref 35.0–45.0)
HEMOGLOBIN: 12.4 g/dL (ref 11.7–15.5)
Lymphs Abs: 1689 cells/uL (ref 850–3900)
MCH: 29.5 pg (ref 27.0–33.0)
MCHC: 33.9 g/dL (ref 32.0–36.0)
MCV: 87.1 fL (ref 80.0–100.0)
MONOS PCT: 6.7 %
MPV: 11.4 fL (ref 7.5–12.5)
NEUTROS ABS: 5773 {cells}/uL (ref 1500–7800)
Neutrophils Relative %: 70.4 %
PLATELETS: 220 10*3/uL (ref 140–400)
RBC: 4.2 10*6/uL (ref 3.80–5.10)
RDW: 12.9 % (ref 11.0–15.0)
TOTAL LYMPHOCYTE: 20.6 %
WBC mixed population: 549 cells/uL (ref 200–950)
WBC: 8.2 10*3/uL (ref 3.8–10.8)

## 2018-04-08 LAB — COMPLETE METABOLIC PANEL WITH GFR
AG RATIO: 1.5 (calc) (ref 1.0–2.5)
ALBUMIN MSPROF: 4.4 g/dL (ref 3.6–5.1)
ALKALINE PHOSPHATASE (APISO): 73 U/L (ref 33–130)
ALT: 11 U/L (ref 6–29)
AST: 18 U/L (ref 10–35)
BILIRUBIN TOTAL: 0.5 mg/dL (ref 0.2–1.2)
BUN / CREAT RATIO: 23 (calc) — AB (ref 6–22)
BUN: 25 mg/dL (ref 7–25)
CO2: 27 mmol/L (ref 20–32)
CREATININE: 1.08 mg/dL — AB (ref 0.60–0.93)
Calcium: 10.2 mg/dL (ref 8.6–10.4)
Chloride: 100 mmol/L (ref 98–110)
GFR, Est African American: 57 mL/min/{1.73_m2} — ABNORMAL LOW (ref >=60)
GFR, Est Non African American: 49 mL/min/{1.73_m2} — ABNORMAL LOW (ref >=60)
GLOBULIN: 3 g/dL (ref 1.9–3.7)
Glucose, Bld: 97 mg/dL (ref 65–99)
Potassium: 4.6 mmol/L (ref 3.5–5.3)
SODIUM: 136 mmol/L (ref 135–146)
Total Protein: 7.4 g/dL (ref 6.1–8.1)

## 2018-04-08 LAB — LIPID PANEL
CHOLESTEROL: 162 mg/dL (ref <200)
HDL: 51 mg/dL (ref >50)
LDL CHOLESTEROL (CALC): 83 mg/dL
Non-HDL Cholesterol (Calc): 111 mg/dL (calc) (ref <130)
TRIGLYCERIDES: 186 mg/dL — AB (ref <150)
Total CHOL/HDL Ratio: 3.2 (calc) (ref <5.0)

## 2018-04-08 LAB — HEMOGLOBIN A1C
EAG (MMOL/L): 6.5 (calc)
Hgb A1c MFr Bld: 5.7 % of total Hgb — ABNORMAL HIGH (ref <5.7)
Mean Plasma Glucose: 117 (calc)

## 2018-04-08 MED ORDER — ROSUVASTATIN CALCIUM 20 MG PO TABS: 20.0000 mg | ORAL_TABLET | Freq: Every day | ORAL | 1 refills | Status: DC

## 2018-04-08 NOTE — Progress Notes (Signed)
Increase statin

## 2018-04-08 NOTE — Telephone Encounter (Signed)
-----   Message from Kerman Passey, MD sent at 04/08/2018 12:27 PM EDT ----- Asher Muir, please let the patient know that her A1c is in the prediabetes range; weight loss will be the single most important thing she can do to help prevent developing diabetes in the future; her LDL went up, so I'll recommend that we increase her Crestor from 10 mg to 20 mg (new Rx sent already); let's recheck her fasting lipids in 6-8 weeks (please ORDER); encourage hydration; thank you

## 2018-04-25 ENCOUNTER — Other Ambulatory Visit: Payer: Self-pay | Admitting: Family Medicine

## 2018-04-26 ENCOUNTER — Other Ambulatory Visit: Payer: Self-pay | Admitting: Family Medicine

## 2018-05-14 DIAGNOSIS — R809 Proteinuria, unspecified: Secondary | ICD-10-CM | POA: Diagnosis not present

## 2018-05-14 DIAGNOSIS — N183 Chronic kidney disease, stage 3 (moderate): Secondary | ICD-10-CM | POA: Diagnosis not present

## 2018-05-14 DIAGNOSIS — I129 Hypertensive chronic kidney disease with stage 1 through stage 4 chronic kidney disease, or unspecified chronic kidney disease: Secondary | ICD-10-CM | POA: Diagnosis not present

## 2018-05-14 DIAGNOSIS — N2581 Secondary hyperparathyroidism of renal origin: Secondary | ICD-10-CM | POA: Diagnosis not present

## 2018-05-20 ENCOUNTER — Encounter: Payer: Self-pay | Admitting: Family Medicine

## 2018-05-20 DIAGNOSIS — I129 Hypertensive chronic kidney disease with stage 1 through stage 4 chronic kidney disease, or unspecified chronic kidney disease: Secondary | ICD-10-CM | POA: Insufficient documentation

## 2018-05-20 DIAGNOSIS — N189 Chronic kidney disease, unspecified: Secondary | ICD-10-CM

## 2018-05-20 DIAGNOSIS — D631 Anemia in chronic kidney disease: Secondary | ICD-10-CM | POA: Insufficient documentation

## 2018-05-20 DIAGNOSIS — N2581 Secondary hyperparathyroidism of renal origin: Secondary | ICD-10-CM | POA: Insufficient documentation

## 2018-06-18 ENCOUNTER — Other Ambulatory Visit: Payer: Self-pay

## 2018-06-18 DIAGNOSIS — E7800 Pure hypercholesterolemia, unspecified: Secondary | ICD-10-CM

## 2018-06-18 LAB — LIPID PANEL
Cholesterol: 135 mg/dL (ref <200)
HDL: 43 mg/dL — AB (ref >50)
LDL Cholesterol (Calc): 71 mg/dL (calc)
NON-HDL CHOLESTEROL (CALC): 92 mg/dL (ref <130)
Total CHOL/HDL Ratio: 3.1 (calc) (ref <5.0)
Triglycerides: 129 mg/dL (ref <150)

## 2018-07-25 ENCOUNTER — Other Ambulatory Visit: Payer: Self-pay | Admitting: Family Medicine

## 2018-08-15 ENCOUNTER — Ambulatory Visit (INDEPENDENT_AMBULATORY_CARE_PROVIDER_SITE_OTHER): Payer: Medicare Other

## 2018-08-15 VITALS — BP 118/78 | HR 65 | Temp 97.5°F | Resp 12 | Ht 66.0 in | Wt 166.7 lb

## 2018-08-15 DIAGNOSIS — Z23 Encounter for immunization: Secondary | ICD-10-CM

## 2018-08-15 DIAGNOSIS — Z0000 Encounter for general adult medical examination without abnormal findings: Secondary | ICD-10-CM

## 2018-08-15 NOTE — Patient Instructions (Signed)
Janice [name] , Thank you for taking time to come for your Medicare Wellness Visit. I appreciate your ongoing commitment to your health goals. Please review the following plan we discussed and let me know if I can assist you in the future.   Screening recommendations/referrals: Colorectal Screening: No longer required Mammogram: No longer required Bone Density: Declined  Vision and Dental Exams: Recommended annual ophthalmology exams for early detection of glaucoma and other disorders of the eye Recommended annual dental exams for proper oral hygiene  Vaccinations: Influenza vaccine: Completed today Pneumococcal vaccine: Up to date Tdap vaccine: Up to date Shingles vaccine: Please call your insurance company to determine your out of pocket expense for the Shingrix vaccine. You may receive this vaccine at your local pharmacy.  Advanced directives: Please bring a copy of your POA (Power of Attorney) and/or Living Will to your next appointment.  Goals: Recommend eating 3 small healthy meals and at least 2 healthy snacks per day.  Next appointment: Please schedule your Annual Wellness Visit with your Nurse Health Advisor in one year.  Preventive Care 5365 Years and Older, Female Preventive care refers to lifestyle choices and visits with your health care provider that can promote health and wellness. What does preventive care include?  A yearly physical exam. This is also called an annual well check.  Dental exams once or twice a year.  Routine eye exams. Ask your health care provider how often you should have your eyes checked.  Personal lifestyle choices, including:  Daily care of your teeth and gums.  Regular physical activity.  Eating a healthy diet.  Avoiding tobacco and drug use.  Limiting alcohol use.  Practicing safe sex.  Taking low-dose aspirin every day.  Taking vitamin and mineral supplements as recommended by your health care provider. What happens during an  annual well check? The services and screenings done by your health care provider during your annual well check will depend on your age, overall health, lifestyle risk factors, and family history of disease. Counseling  Your health care provider may ask you questions about your:  Alcohol use.  Tobacco use.  Drug use.  Emotional well-being.  Home and relationship well-being.  Sexual activity.  Eating habits.  History of falls.  Memory and ability to understand (cognition).  Work and work Astronomerenvironment.  Reproductive health. Screening  You may have the following tests or measurements:  Height, weight, and BMI.  Blood pressure.  Lipid and cholesterol levels. These may be checked every 5 years, or more frequently if you are over 79 years old.  Skin check.  Lung cancer screening. You may have this screening every year starting at age 79 if you have a 30-pack-year history of smoking and currently smoke or have quit within the past 15 years.  Fecal occult blood test (FOBT) of the stool. You may have this test every year starting at age 79.  Flexible sigmoidoscopy or colonoscopy. You may have a sigmoidoscopy every 5 years or a colonoscopy every 10 years starting at age 79.  Hepatitis C blood test.  Hepatitis B blood test.  Sexually transmitted disease (STD) testing.  Diabetes screening. This is done by checking your blood sugar (glucose) after you have not eaten for a while (fasting). You may have this done every 1-3 years.  Bone density scan. This is done to screen for osteoporosis. You may have this done starting at age 79.  Mammogram. This may be done every 1-2 years. Talk to your health care provider  about how often you should have regular mammograms. Talk with your health care provider about your test results, treatment options, and if necessary, the need for more tests. Vaccines  Your health care provider may recommend certain vaccines, such as:  Influenza  vaccine. This is recommended every year.  Tetanus, diphtheria, and acellular pertussis (Tdap, Td) vaccine. You may need a Td booster every 10 years.  Zoster vaccine. You may need this after age 78.  Pneumococcal 13-valent conjugate (PCV13) vaccine. One dose is recommended after age 78.  Pneumococcal polysaccharide (PPSV23) vaccine. One dose is recommended after age 102. Talk to your health care provider about which screenings and vaccines you need and how often you need them. This information is not intended to replace advice given to you by your health care provider. Make sure you discuss any questions you have with your health care provider. Document Released: 12/16/2015 Document Revised: 08/08/2016 Document Reviewed: 09/20/2015 Elsevier Interactive Patient Education  2017 [place] Prevention in the Home Falls can cause injuries. They can happen to people of all ages. There are many things you can do to make your home safe and to help prevent falls. What can I do on the outside of my home?  Regularly fix the edges of walkways and driveways and fix any cracks.  Remove anything that might make you trip as you walk through a door, such as a raised step or threshold.  Trim any bushes or trees on the path to your home.  Use bright outdoor lighting.  Clear any walking paths of anything that might make someone trip, such as rocks or tools.  Regularly check to see if handrails are loose or broken. Make sure that both sides of any steps have handrails.  Any raised decks and porches should have guardrails on the edges.  Have any leaves, snow, or ice cleared regularly.  Use sand or salt on walking paths during winter.  Clean up any spills in your garage right away. This includes oil or grease spills. What can I do in the bathroom?  Use night lights.  Install grab bars by the toilet and in the tub and shower. Do not use towel bars as grab bars.  Use non-skid mats or decals  in the tub or shower.  If you need to sit down in the shower, use a plastic, non-slip stool.  Keep the floor dry. Clean up any water that spills on the floor as soon as it happens.  Remove soap buildup in the tub or shower regularly.  Attach bath mats securely with double-sided non-slip rug tape.  Do not have throw rugs and other things on the floor that can make you trip. What can I do in the bedroom?  Use night lights.  Make sure that you have a light by your bed that is easy to reach.  Do not use any sheets or blankets that are too big for your bed. They should not hang down onto the floor.  Have a firm chair that has side arms. You can use this for support while you get dressed.  Do not have throw rugs and other things on the floor that can make you trip. What can I do in the kitchen?  Clean up any spills right away.  Avoid walking on wet floors.  Keep items that you use a lot in easy-to-reach places.  If you need to reach something above you, use a strong step stool that has a grab bar.  Keep electrical cords out of the way.  Do not use floor polish or wax that makes floors slippery. If you must use wax, use non-skid floor wax.  Do not have throw rugs and other things on the floor that can make you trip. What can I do with my stairs?  Do not leave any items on the stairs.  Make sure that there are handrails on both sides of the stairs and use them. Fix handrails that are broken or loose. Make sure that handrails are as long as the stairways.  Check any carpeting to make sure that it is firmly attached to the stairs. Fix any carpet that is loose or worn.  Avoid having throw rugs at the top or bottom of the stairs. If you do have throw rugs, attach them to the floor with carpet tape.  Make sure that you have a light switch at the top of the stairs and the bottom of the stairs. If you do not have them, ask someone to add them for you. What else can I do to help  prevent falls?  Wear shoes that:  Do not have high heels.  Have rubber bottoms.  Are comfortable and fit you well.  Are closed at the toe. Do not wear sandals.  If you use a stepladder:  Make sure that it is fully opened. Do not climb a closed stepladder.  Make sure that both sides of the stepladder are locked into place.  Ask someone to hold it for you, if possible.  Clearly mark and make sure that you can see:  Any grab bars or handrails.  First and last steps.  Where the edge of each step is.  Use tools that help you move around (mobility aids) if they are needed. These include:  Canes.  Walkers.  Scooters.  Crutches.  Turn on the lights when you go into a dark area. Replace any light bulbs as soon as they burn out.  Set up your furniture so you have a clear path. Avoid moving your furniture around.  If any of your floors are uneven, fix them.  If there are any pets around you, be aware of where they are.  Review your medicines with your doctor. Some medicines can make you feel dizzy. This can increase your chance of falling. Ask your doctor what other things that you can do to help prevent falls. This information is not intended to replace advice given to you by your health care provider. Make sure you discuss any questions you have with your health care provider. Document Released: 09/15/2009 Document Revised: 04/26/2016 Document Reviewed: 12/24/2014 Elsevier Interactive Patient Education  2017 [employer].

## 2018-08-15 NOTE — Progress Notes (Signed)
Subjective:   Janice [name] is a 77 y.o. female who presents for Medicare Annual (Subsequent) preventive examination.  Review of Systems:  N/A Cardiac Risk Factors include: advanced age (>72men, >19 women);dyslipidemia;sedentary lifestyle;hypertension     Objective:     Vitals: BP 118/78 (BP Location: Right Arm, Patient Position: Sitting, Cuff Size: Large)   Pulse 65   Temp (!) 97.5 F (36.4 C) (Oral)   Resp 12   Ht 5\' 6"  (1.676 m)   Wt 166 lb 11.2 oz (75.6 kg)   SpO2 97%   BMI 26.91 kg/m   Body mass index is 26.91 kg/m.  Advanced Directives 08/15/2018 10/10/2017 09/28/2017 05/09/2017 11/21/2016 05/22/2016 03/05/2016  Does Patient Have a Medical Advance Directive? Yes No No No No No No  Type of Estate agent of [place];Living will - - - - - -  Copy of Healthcare Power of Attorney in Chart? No - copy requested - - - - - -  Would patient like information on creating a medical advance directive? - No - Patient declined No - Patient declined - - No - patient declined information No - patient declined information    Tobacco Social History   Tobacco Use  Smoking Status Former Smoker  . Packs/day: 2.00  . Years: 50.00  . Pack years: 100.00  . Types: Cigarettes  . Last attempt to quit: 12/03/2008  . Years since quitting: 9.7  Smokeless Tobacco Never Used  Tobacco Comment   smoking cessation materials not required     Counseling given: No Comment: smoking cessation materials not required  Clinical Intake:  Pre-visit preparation completed: Yes  Pain : No/denies pain   BMI - recorded: 26.91 Nutritional Status: BMI 25 -29 Overweight Nutritional Risks: None Diabetes: No  How often do you need to have someone help you when you read instructions, pamphlets, or other written materials from your doctor or pharmacy?: 1 - Never  Interpreter Needed?: No  Information entered by :: AEversole, LPN  Past Medical History:  Diagnosis Date  . Chronic kidney  disease   . Hernia, ventral   . Hyperlipidemia   . Hypertension   . Subclavian arterial stenosis (HCC)   . Thyroid disease    Past Surgical History:  Procedure Laterality Date  . artery bypass rechanneling  2010  . CHOLECYSTECTOMY    . CLOSED REDUCTION NASAL FRACTURE N/A 10/10/2017   Procedure: CLOSED REDUCTION NASAL BONE FRACTURE;  Surgeon: [name], MD;  Location: [facility] ORS;  Service: ENT;  Laterality: N/A;  . HERNIA REPAIR  03/05/2016   15 x 22 cm retrorectus [facility] mesh repair.  . INSERTION OF MESH N/A 03/05/2016   Procedure: INSERTION OF MESH;  Surgeon: [name], MD;  Location: [facility] ORS;  Service: General;  Laterality: N/A;  . VENTRAL HERNIA REPAIR N/A 03/05/2016   Procedure: HERNIA REPAIR VENTRAL ADULT;  Surgeon: [name], MD;  Location: [facility] ORS;  Service: General;  Laterality: N/A;   Family History  Problem Relation Age of Onset  . Heart disease Mother   . Hypertension Mother   . Heart disease Father   . Hypertension Father   . Hypertension Sister   . Diabetes Sister   . Cancer Sister        lung  . COPD Neg Hx   . Stroke Neg Hx    Social History   Socioeconomic History  . Marital status: Widowed    Spouse name: Not on file  . Number of children:  4  . Years of education: some college  . Highest education level: 12th grade  Occupational History  . Occupation: Retired  Engineer, production  . Financial resource strain: Not hard at all  . Food insecurity:    Worry: Never true    Inability: Never true  . Transportation needs:    Medical: No    Non-medical: No  Tobacco Use  . Smoking status: Former Smoker    Packs/day: 2.00    Years: 50.00    Pack years: 100.00    Types: Cigarettes    Last attempt to quit: 12/03/2008    Years since quitting: 9.7  . Smokeless tobacco: Never Used  . Tobacco comment: smoking cessation materials not required  Substance and Sexual Activity  . Alcohol use: No    Alcohol/week: 0.0 standard drinks  . Drug use: No    . Sexual activity: Not Currently  Lifestyle  . Physical activity:    Days per week: 3 days    Minutes per session: 20 min  . Stress: Not at all  Relationships  . Social connections:    Talks on phone: Patient refused    Gets together: Patient refused    Attends religious service: Patient refused    Active member of club or organization: Patient refused    Attends meetings of clubs or organizations: Patient refused    Relationship status: Divorced  Other Topics Concern  . Not on file  Social History Narrative  . Not on file    Outpatient Encounter Medications as of 08/15/2018  Medication Sig  . amLODipine (NORVASC) 10 MG tablet Take 1 tablet (10 mg total) by mouth daily.  Marland Kitchen aspirin EC 81 MG tablet Take 81 mg daily by mouth.  . Cholecalciferol (VITAMIN D3) 1000 UNITS CAPS Take 1,000 Int'l Units by mouth daily.  . hydrochlorothiazide (HYDRODIURIL) 25 MG tablet TAKE 1 TABLET BY MOUTH EVERY DAY  . levothyroxine (SYNTHROID, LEVOTHROID) 112 MCG tablet Take 1 tablet (112 mcg total) by mouth daily before breakfast.  . losartan (COZAAR) 50 MG tablet Take 1 tablet (50 mg total) by mouth daily.  . metoprolol tartrate (LOPRESSOR) 25 MG tablet TAKE 1 TABLET (25 MG TOTAL) BY MOUTH 2 (TWO) TIMES DAILY.  . rosuvastatin (CRESTOR) 20 MG tablet Take 1 tablet (20 mg total) by mouth at bedtime.   No facility-administered encounter medications on file as of 08/15/2018.     Activities of Daily Living In your present state of health, do you have any difficulty performing the following activities: 08/15/2018 04/07/2018  Hearing? N N  Comment denies hearing aids -  Vision? N N  Comment denies eyeglasses -  Difficulty concentrating or making decisions? N N  Walking or climbing stairs? N N  Dressing or bathing? N N  Doing errands, shopping? N N  Preparing Food and eating ? N -  Comment full set upper and lower dentures -  Using the Toilet? N -  In the past six months, have you accidently leaked urine?  N -  Do you have problems with loss of bowel control? N -  Managing your Medications? N -  Managing your Finances? N -  Housekeeping or managing your Housekeeping? N -  Some recent data might be hidden    Patient Care Team: Lada, Janit Bern, MD as PCP - General (Family Medicine) Lada, Janit Bern, MD as PCP - Family Medicine (Family Medicine) Lemar Livings, Merrily Pew, MD as Consulting Physician (General Surgery) Lamont Dowdy, MD as Consulting Physician (Internal  Medicine)    Assessment:   This is a routine wellness examination for [place].  Exercise Activities and Dietary recommendations Current Exercise Habits: Home exercise routine, Type of exercise: walking, Time (Minutes): 20, Frequency (Times/Week): 3, Weekly Exercise (Minutes/Week): 60, Intensity: Mild, Exercise limited by: None identified  Goals    . diet     Recommend eating 3 small healthy meals and at least 2 healthy snacks per day       Fall Risk Fall Risk  08/15/2018 04/07/2018 10/08/2017 05/09/2017 11/21/2016  Falls in the past year? No No Yes No No  Number falls in past yr: - - 1 - -  Injury with Fall? - - Yes - -  Risk for fall due to : History of fall(s) - - - -  Risk for fall due to: Comment tripped over a speed bump - - - -   FALL RISK PREVENTION PERTAINING TO HOME: Is your home free of loose throw rugs in walkways, pet beds, electrical cords, etc? Yes Is there adequate lighting in your home to reduce risk of falls?  Yes Are there stairs in or around your home WITH handrails? Yes  ASSISTIVE DEVICES UTILIZED TO PREVENT FALLS: Use of a cane, walker or w/c? No Grab bars in the bathroom? No  Shower chair or a place to sit while bathing? No An elevated toilet seat or a handicapped toilet? No  Timed Get Up and Go Performed: Yes. Pt ambulated 10 feet within 22 sec. Gait slow, steady and without the use of an assistive device. No intervention required at this time. Fall risk prevention has been discussed.  Community  Resource Referral:  Pt declined my offer to send [employer] Corporation[employer] Resource Referral to Care Guide for installation of grab bars in the shower, shower chair or an elevated toilet seat.  Depression Screen PHQ 2/9 Scores 08/15/2018 04/07/2018 10/08/2017 05/09/2017  PHQ - 2 Score 0 0 0 0  PHQ- 9 Score 0 - - -     Cognitive Function     6CIT Screen 08/15/2018  What Year? 0 points  What month? 0 points  What time? 3 points  Count back from 20 0 points  Months in reverse 0 points  Repeat phrase 4 points  Total Score 7    Immunization History  Administered Date(s) Administered  . Influenza, High Dose Seasonal PF 10/08/2017, 08/15/2018  . Influenza,inj,Quad PF,6+ Mos 10/17/2016  . Influenza-Unspecified 11/15/2014  . Pneumococcal Conjugate-13 11/15/2014  . Td 12/03/2008    Qualifies for Shingles Vaccine? Yes. Due for Shingrix. Education has been provided regarding the importance of this vaccine. Pt has been advised to call insurance company to determine out of pocket expense. Advised may also receive vaccine at local pharmacy or Health Dept. Verbalized acceptance and understanding.  Screening Tests Health Maintenance  Topic Date Due  . DEXA SCAN  04/08/2019 (Originally 09/18/2004)  . TETANUS/TDAP  12/03/2018  . INFLUENZA VACCINE  Completed  . PNA vac Low Risk Adult  Completed    Cancer Screenings: Lung: Low Dose CT Chest recommended if Age 86-80 years, 30 pack-year currently smoking OR have quit w/in 15years. Patient does not qualify. Breast Screening: No longer required Up to date of Bone Density/Dexa? No. Declined my offer to order this screening. Education provided re: importance of this screening but still declined. Colorectal: No longer required  Additional Screenings: Hepatitis C Screening: Does not qualify    Plan:  I have personally reviewed and addressed the Medicare Annual Wellness questionnaire and have  noted the following in the patient's chart:  A. Medical and social  history B. Use of alcohol, tobacco or illicit drugs  C. Current medications and supplements D. Functional ability and status E.  Nutritional status F.  Physical activity G. Advance directives H. List of other physicians I.  Hospitalizations, surgeries, and ER visits in previous 12 months J.  Vitals K. Screenings such as hearing and vision if needed, cognitive and depression L. Referrals and appointments  In addition, I have reviewed and discussed with patient certain preventive protocols, quality metrics, and best practice recommendations. A written personalized care plan for preventive services as well as general preventive health recommendations were provided to patient.  See attached scanned questionnaire for additional information.   Signed,  Deon Pilling, LPN Nurse Health Advisor

## 2018-09-23 ENCOUNTER — Other Ambulatory Visit: Payer: Self-pay | Admitting: Family Medicine

## 2018-10-10 ENCOUNTER — Ambulatory Visit: Payer: Medicare Other | Admitting: Family Medicine

## 2018-10-17 ENCOUNTER — Other Ambulatory Visit: Payer: Self-pay | Admitting: Family Medicine

## 2018-10-17 NOTE — Telephone Encounter (Signed)
Patient has an appt next week

## 2018-10-21 ENCOUNTER — Ambulatory Visit (INDEPENDENT_AMBULATORY_CARE_PROVIDER_SITE_OTHER): Payer: Medicare Other | Admitting: Family Medicine

## 2018-10-21 ENCOUNTER — Encounter: Payer: Self-pay | Admitting: Family Medicine

## 2018-10-21 VITALS — BP 112/74 | HR 63 | Temp 97.7°F | Ht 61.75 in | Wt 161.2 lb

## 2018-10-21 DIAGNOSIS — D631 Anemia in chronic kidney disease: Secondary | ICD-10-CM

## 2018-10-21 DIAGNOSIS — N183 Chronic kidney disease, stage 3 unspecified: Secondary | ICD-10-CM

## 2018-10-21 DIAGNOSIS — I129 Hypertensive chronic kidney disease with stage 1 through stage 4 chronic kidney disease, or unspecified chronic kidney disease: Secondary | ICD-10-CM | POA: Diagnosis not present

## 2018-10-21 DIAGNOSIS — E663 Overweight: Secondary | ICD-10-CM | POA: Diagnosis not present

## 2018-10-21 DIAGNOSIS — E7800 Pure hypercholesterolemia, unspecified: Secondary | ICD-10-CM | POA: Diagnosis not present

## 2018-10-21 DIAGNOSIS — E039 Hypothyroidism, unspecified: Secondary | ICD-10-CM

## 2018-10-21 DIAGNOSIS — N2581 Secondary hyperparathyroidism of renal origin: Secondary | ICD-10-CM | POA: Diagnosis not present

## 2018-10-21 DIAGNOSIS — R7301 Impaired fasting glucose: Secondary | ICD-10-CM

## 2018-10-21 NOTE — Assessment & Plan Note (Signed)
Managed by Dr. Wynelle LinkKolluru; asymptomatic

## 2018-10-21 NOTE — Progress Notes (Signed)
BP 112/74   Pulse 63   Temp 97.7 F (36.5 C) (Oral)   Ht 5' 1.75" (1.568 m)   Wt 161 lb 3.2 oz (73.1 kg)   SpO2 96%   BMI 29.72 kg/m    Height rechecked today; not 5'6" but 5'1.75" BMI changed from 26.02 to 29.72  Subjective:    Patient ID: Janice [name], female    DOB: [birth year], 77 y.o.   MRN: [id number]  HPI: Janice [name] is a 77 y.o. female  Chief Complaint  Patient presents with  . Follow-up    HPI Here for f/u No medical excitement since last visit She saw nephrologist, Dr. 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  High cholesterol; taking statin; no muscle aches; watching diet more closely  Overweight; patient has been working hard; trying to lose weight; eating better; no extra activity  Prediabetes; last A1c 5.7 in May; no dry mouth; sister has diabetes, but she weighs 240 pounds Hypothyroidism; bowels are moving okay; energy okay; no hair loss  Lab Results  Component Value Date   TSH 2.86 01/29/2018     Depression screen [facility] 2/9 10/21/2018 08/15/2018 04/07/2018 10/08/2017 05/09/2017  Decreased Interest 0 0 0 0 0  Down, Depressed, Hopeless 0 0 0 0 0  PHQ - 2 Score 0 0 0 0 0  Altered sleeping 0 0 - - -  Tired, decreased energy 0 0 - - -  Change in appetite 0 0 - - -  Feeling bad or failure about yourself  0 0 - - -  Trouble concentrating 0 0 - - -  Moving slowly or fidgety/restless 0 0 - - -  Suicidal thoughts 0 0 - - -  PHQ-9 Score 0 0 - - -  Difficult doing work/chores Not difficult at all Not difficult at all - - -   Fall Risk  10/21/2018 08/15/2018 04/07/2018 10/08/2017 05/09/2017  Falls in the past year? 0 No No Yes No  Number falls in past yr: 0 - - 1 -  Injury with Fall? - - - Yes -  Risk for fall due to : - History of fall(s) - - -  Risk for  fall due to: Comment - tripped over a speed bump - - -    Relevant past medical, surgical, family and social history reviewed Past Medical History:  Diagnosis Date  . Chronic kidney disease   . Hernia, ventral   . Hyperlipidemia   . Hypertension   . Subclavian arterial stenosis (HCC)   . Thyroid disease    Past Surgical History:  Procedure Laterality Date  . artery bypass rechanneling  2010  . CHOLECYSTECTOMY    . CLOSED REDUCTION NASAL FRACTURE N/A 10/10/2017   Procedure: CLOSED REDUCTION NASAL BONE FRACTURE;  Surgeon: [name], MD;  Location: [facility] ORS;  Service: ENT;  Laterality: N/A;  . HERNIA REPAIR  03/05/2016   15 x 22 cm retrorectus [facility] mesh repair.  . INSERTION OF MESH N/A 03/05/2016   Procedure: INSERTION OF MESH;  Surgeon: [name], MD;  Location: [facility] ORS;  Service: General;  Laterality: N/A;  . VENTRAL HERNIA REPAIR N/A 03/05/2016   Procedure: HERNIA REPAIR VENTRAL ADULT;  Surgeon: [name], MD;  Location: [facility] ORS;  Service: General;  Laterality: N/A;   Family  History  Problem Relation Age of Onset  . Heart disease Mother   . Hypertension Mother   . Heart disease Father   . Hypertension Father   . Hypertension Sister   . Diabetes Sister   . Cancer Sister        lung  . COPD Neg Hx   . Stroke Neg Hx    Social History   Tobacco Use  . Smoking status: Former Smoker    Packs/day: 2.00    Years: 50.00    Pack years: 100.00    Types: Cigarettes    Last attempt to quit: 12/03/2008    Years since quitting: 9.9  . Smokeless tobacco: Never Used  . Tobacco comment: smoking cessation materials not required  Substance Use Topics  . Alcohol use: No    Alcohol/week: 0.0 standard drinks  . Drug use: No     [facility] 10/21/2018 [facility]  AUDIT-C Score  1      Interim medical history since last visit reviewed. Allergies and medications reviewed  Review of Systems Per HPI unless specifically indicated  above     Objective:    BP 112/74   Pulse 63   Temp 97.7 F (36.5 C) (Oral)   Ht 5' 1.75" (1.568 m)   Wt 161 lb 3.2 oz (73.1 kg)   SpO2 96%   BMI 29.72 kg/m   Wt Readings from Last 3 Encounters:  10/21/18 161 lb 3.2 oz (73.1 kg)  08/15/18 166 lb 11.2 oz (75.6 kg)  04/07/18 172 lb 9.6 oz (78.3 kg)    Physical Exam  Constitutional: She appears well-developed and well-nourished. No distress.  HENT:  Head: Normocephalic and atraumatic.  Eyes: EOM are normal. No scleral icterus.  Neck: No thyromegaly present.  Cardiovascular: Normal rate, regular rhythm and normal heart sounds.  No murmur heard. Pulmonary/Chest: Effort normal and breath sounds normal. No respiratory distress. She has no wheezes.  Abdominal: Soft. Bowel sounds are normal. She exhibits no distension.  Musculoskeletal: She exhibits no edema.  Neurological: She is alert.  Skin: Skin is warm and dry. She is not diaphoretic. No pallor.  Psychiatric: She has a normal mood and affect. Her behavior is normal. Judgment and thought content normal.      Assessment & Plan:   Problem List Items Addressed This Visit      Endocrine   IFG (impaired fasting glucose) - Primary   Relevant Orders   Hemoglobin A1c (Completed)   Hypothyroidism    Stable, continue dose      Hyperparathyroidism, secondary renal (HCC)    Managed by nephrologist        Genitourinary   Hypertensive renal disease    Well-controlled BP today      CKD (chronic kidney disease) stage 3, GFR 30-59 ml/min (HCC)    Managed by Dr. Wynelle Link; avoiding NSAIDs; tylenol only PRN; encouraged hydration      Anemia of chronic renal failure    Managed by Dr. Wynelle Link; asymptomatic        Other   Overweight (BMI 25.0-29.9)    Praise given for weight loss      Hypercholesteremia    Check lipids; continue to watch diet and lose weight      Relevant Orders   Lipid panel (Completed)       Follow up plan: Return in about 6 months (around  04/21/2019) for follow-up visit with Dr. Sherie Don (or just after).  An after-visit summary was printed  and given to the patient at check-out.  Please see the patient instructions which may contain other information and recommendations beyond what is mentioned above in the assessment and plan.  No orders of the defined types were placed in this encounter.   Orders Placed This Encounter  Procedures  . Lipid panel  . Hemoglobin A1c

## 2018-10-21 NOTE — Assessment & Plan Note (Signed)
Stable, continue dose

## 2018-10-21 NOTE — Assessment & Plan Note (Signed)
Managed by nephrologist 

## 2018-10-21 NOTE — Assessment & Plan Note (Signed)
Check lipids; continue to watch diet and lose weight

## 2018-10-21 NOTE — Assessment & Plan Note (Signed)
Well controlled BP today

## 2018-10-21 NOTE — Patient Instructions (Signed)
Keep up the great job with your healthier eating and weight loss Try to limit saturated fats in your diet (bologna, hot dogs, barbeque, cheeseburgers, hamburgers, steak, bacon, sausage, cheese, etc.) and get more fresh fruits, vegetables, and whole grains Try to follow the DASH guidelines (DASH stands for Dietary Approaches to Stop Hypertension). Try to limit the sodium in your diet to no more than 1,500mg  of sodium per day. Certainly try to not exceed 2,000 mg per day at the very most. Do not add salt when cooking or at the table.  Check the sodium amount on labels when shopping, and choose items lower in sodium when given a choice. Avoid or limit foods that already contain a lot of sodium. Eat a diet rich in fruits and vegetables and whole grains, and try to lose weight if overweight or obese We'll get labs today If you have not heard anything from my staff in a week about any orders/referrals/studies from today, please contact us here to follow-up [phone]

## 2018-10-21 NOTE — Assessment & Plan Note (Signed)
Managed by Dr. Wynelle LinkKolluru; avoiding NSAIDs; tylenol only PRN; encouraged hydration

## 2018-10-21 NOTE — Assessment & Plan Note (Signed)
Praise given for weight loss 

## 2018-10-22 ENCOUNTER — Other Ambulatory Visit: Payer: Self-pay | Admitting: Family Medicine

## 2018-10-22 LAB — HEMOGLOBIN A1C
EAG (MMOL/L): 6.3 (calc)
HEMOGLOBIN A1C: 5.6 %{Hb} (ref <5.7)
Mean Plasma Glucose: 114 (calc)

## 2018-10-22 LAB — LIPID PANEL
Cholesterol: 135 mg/dL (ref <200)
HDL: 48 mg/dL — ABNORMAL LOW (ref >50)
LDL CHOLESTEROL (CALC): 69 mg/dL
Non-HDL Cholesterol (Calc): 87 mg/dL (calc) (ref <130)
Total CHOL/HDL Ratio: 2.8 (calc) (ref <5.0)
Triglycerides: 96 mg/dL (ref <150)

## 2018-10-26 ENCOUNTER — Other Ambulatory Visit: Payer: Self-pay | Admitting: Family Medicine

## 2018-10-27 NOTE — Telephone Encounter (Signed)
Lab Results  Component Value Date   TSH 2.86 01/29/2018

## 2019-01-22 ENCOUNTER — Other Ambulatory Visit: Payer: Self-pay | Admitting: Family Medicine

## 2019-01-25 ENCOUNTER — Other Ambulatory Visit: Payer: Self-pay | Admitting: Family Medicine

## 2019-01-25 DIAGNOSIS — E039 Hypothyroidism, unspecified: Secondary | ICD-10-CM

## 2019-01-25 DIAGNOSIS — Z5181 Encounter for therapeutic drug level monitoring: Secondary | ICD-10-CM

## 2019-01-25 NOTE — Telephone Encounter (Signed)
Lab Results  Component Value Date   TSH 2.86 01/29/2018   Janice [name], please let the patient know that her last thyroid check was right at one year ago, so it's time to get that checked again (I'll order) I sent in a 30 day supply We'll also check liver and kidney function (I'll order) Too soon for lipid and A1c; those will be due on or just after Apr 22, 2019

## 2019-01-26 NOTE — Telephone Encounter (Signed)
Left detailed VM, CRM created.  

## 2019-02-04 ENCOUNTER — Other Ambulatory Visit: Payer: Self-pay

## 2019-02-04 DIAGNOSIS — E039 Hypothyroidism, unspecified: Secondary | ICD-10-CM | POA: Diagnosis not present

## 2019-02-04 DIAGNOSIS — Z5181 Encounter for therapeutic drug level monitoring: Secondary | ICD-10-CM | POA: Diagnosis not present

## 2019-02-04 LAB — TSH: TSH: 0.29 mIU/L — ABNORMAL LOW (ref 0.40–4.50)

## 2019-02-04 LAB — CBC
HEMATOCRIT: 34.1 % — AB (ref 35.0–45.0)
Hemoglobin: 11.6 g/dL — ABNORMAL LOW (ref 11.7–15.5)
MCH: 29.4 pg (ref 27.0–33.0)
MCHC: 34 g/dL (ref 32.0–36.0)
MCV: 86.5 fL (ref 80.0–100.0)
MPV: 11.5 fL (ref 7.5–12.5)
PLATELETS: 235 10*3/uL (ref 140–400)
RBC: 3.94 10*6/uL (ref 3.80–5.10)
RDW: 12.3 % (ref 11.0–15.0)
WBC: 7.1 10*3/uL (ref 3.8–10.8)

## 2019-02-04 LAB — COMPLETE METABOLIC PANEL WITH GFR
AG Ratio: 1.4 (calc) (ref 1.0–2.5)
ALKALINE PHOSPHATASE (APISO): 65 U/L (ref 37–153)
ALT: 7 U/L (ref 6–29)
AST: 14 U/L (ref 10–35)
Albumin: 3.9 g/dL (ref 3.6–5.1)
BUN/Creatinine Ratio: 29 (calc) — ABNORMAL HIGH (ref 6–22)
BUN: 32 mg/dL — ABNORMAL HIGH (ref 7–25)
CO2: 28 mmol/L (ref 20–32)
CREATININE: 1.1 mg/dL — AB (ref 0.60–0.93)
Calcium: 9.8 mg/dL (ref 8.6–10.4)
Chloride: 94 mmol/L — ABNORMAL LOW (ref 98–110)
GFR, Est African American: 55 mL/min/{1.73_m2} — ABNORMAL LOW (ref >=60)
GFR, Est Non African American: 48 mL/min/{1.73_m2} — ABNORMAL LOW (ref >=60)
GLOBULIN: 2.7 g/dL (ref 1.9–3.7)
Glucose, Bld: 107 mg/dL — ABNORMAL HIGH (ref 65–99)
POTASSIUM: 4 mmol/L (ref 3.5–5.3)
SODIUM: 130 mmol/L — AB (ref 135–146)
Total Bilirubin: 0.4 mg/dL (ref 0.2–1.2)
Total Protein: 6.6 g/dL (ref 6.1–8.1)

## 2019-02-06 ENCOUNTER — Other Ambulatory Visit: Payer: Self-pay | Admitting: Family Medicine

## 2019-02-06 MED ORDER — LOSARTAN POTASSIUM 50 MG PO TABS: 100.0000 mg | ORAL_TABLET | Freq: Every day | ORAL | Status: DC

## 2019-02-07 ENCOUNTER — Other Ambulatory Visit: Payer: Self-pay | Admitting: Family Medicine

## 2019-02-07 DIAGNOSIS — E039 Hypothyroidism, unspecified: Secondary | ICD-10-CM

## 2019-02-07 MED ORDER — LEVOTHYROXINE SODIUM 100 MCG PO TABS: 100.0000 ug | ORAL_TABLET | Freq: Every day | ORAL | 1 refills | Status: DC

## 2019-02-07 NOTE — Progress Notes (Signed)
Patient is on a diet, has been drinking a lot of green tea; food restrictions Decrease green tea; make sure to get at least 1500 mg NaCl daily Recheck labs on Tuesday

## 2019-02-10 ENCOUNTER — Encounter: Payer: Self-pay | Admitting: Family Medicine

## 2019-02-10 ENCOUNTER — Ambulatory Visit (INDEPENDENT_AMBULATORY_CARE_PROVIDER_SITE_OTHER): Payer: Medicare Other | Admitting: Family Medicine

## 2019-02-10 VITALS — BP 110/72 | HR 55 | Temp 98.0°F | Resp 15 | Ht 62.0 in | Wt 152.5 lb

## 2019-02-10 DIAGNOSIS — R7301 Impaired fasting glucose: Secondary | ICD-10-CM

## 2019-02-10 DIAGNOSIS — N183 Chronic kidney disease, stage 3 unspecified: Secondary | ICD-10-CM

## 2019-02-10 DIAGNOSIS — E663 Overweight: Secondary | ICD-10-CM

## 2019-02-10 DIAGNOSIS — E871 Hypo-osmolality and hyponatremia: Secondary | ICD-10-CM

## 2019-02-10 DIAGNOSIS — E039 Hypothyroidism, unspecified: Secondary | ICD-10-CM | POA: Diagnosis not present

## 2019-02-10 DIAGNOSIS — Z66 Do not resuscitate: Secondary | ICD-10-CM | POA: Diagnosis not present

## 2019-02-10 DIAGNOSIS — D649 Anemia, unspecified: Secondary | ICD-10-CM | POA: Diagnosis not present

## 2019-02-10 LAB — BASIC METABOLIC PANEL WITH GFR
BUN/Creatinine Ratio: 27 (calc) — ABNORMAL HIGH (ref 6–22)
BUN: 32 mg/dL — AB (ref 7–25)
CO2: 26 mmol/L (ref 20–32)
Calcium: 10.2 mg/dL (ref 8.6–10.4)
Chloride: 100 mmol/L (ref 98–110)
Creat: 1.17 mg/dL — ABNORMAL HIGH (ref 0.60–0.93)
GFR, Est African American: 51 mL/min/{1.73_m2} — ABNORMAL LOW (ref >=60)
GFR, Est Non African American: 44 mL/min/{1.73_m2} — ABNORMAL LOW (ref >=60)
Glucose, Bld: 112 mg/dL — ABNORMAL HIGH (ref 65–99)
Potassium: 4.8 mmol/L (ref 3.5–5.3)
Sodium: 135 mmol/L (ref 135–146)

## 2019-02-10 MED ORDER — LOSARTAN POTASSIUM 100 MG PO TABS: 100.0000 mg | ORAL_TABLET | Freq: Every day | ORAL | 3 refills | Status: DC

## 2019-02-10 NOTE — Assessment & Plan Note (Signed)
Avoiding NSAIDs; see nephrologist once a year

## 2019-02-10 NOTE — Assessment & Plan Note (Signed)
Confirmed again today that patient wishes to be DNR; ordered

## 2019-02-10 NOTE — Patient Instructions (Signed)
We'll get labs today Return in 2 weeks for just a BP check and a BMP for the losartan follow-up Return on or just after April 22nd for the thyroid labs See Dr. Sherie Don in 3 months

## 2019-02-10 NOTE — Assessment & Plan Note (Signed)
Check thyroid function in 6 weeks after new dose of medicine

## 2019-02-10 NOTE — Assessment & Plan Note (Signed)
Anemia of CKD per Dr. Alice Rieger last note

## 2019-02-10 NOTE — Assessment & Plan Note (Signed)
Last A1c reviewed; not due for A1c yet; encouragement given for healthy eating and weight management

## 2019-02-10 NOTE — Assessment & Plan Note (Signed)
Praise given for weight loss; decreased the green tea intake; try to walk a little but start slow and build up gradually

## 2019-02-10 NOTE — Progress Notes (Signed)
BP 110/72   Pulse (!) 55   Temp 98 F (36.7 C)   Resp 15   Ht 5\' 2"  (1.575 m)   Wt 152 lb 8 oz (69.2 kg)   SpO2 98%   BMI 27.89 kg/m    Subjective:    Patient ID: Janice [name], female    DOB: [birth year], 78 y.o.   MRN: [id number]  HPI: Janice [name] is a 78 y.o. female  Chief Complaint  Patient presents with  . Follow-up    HPI Here for f/u  Hypertension; now on the 100 mg losartan (taking two of the 50 mg pills); that was Thursday or Friday  Obesity; she has been on a green diet for 6 months and has lost 14 pounds; drinking more fluids and also; giving up lots of carbs; has red meat a few times a week; changes out fish and chicken; lots of healthy veggies, lean green diet; daughter is doing the diet with her; oatmeal is zero sodium  Low sodium; not sure if related to over abundance of fluid or the big change in her diet; low sodium diet, watches the sodium in the labels  Hypothyroidism; started the new lower dose of thyroid medicine just today  Seeing Dr. Wynelle Link for CKD stage 3; uses only tylenol occasionally if needed; no NSAIDs; quit smoking in 2010  Mild anemia; Dr. Wynelle Link says this is anemia of CKD   Depression screen [facility] 2/9 02/10/2019 10/21/2018 08/15/2018 04/07/2018 10/08/2017  Decreased Interest 0 0 0 0 0  Down, Depressed, Hopeless 0 0 0 0 0  PHQ - 2 Score 0 0 0 0 0  Altered sleeping 0 0 0 - -  Tired, decreased energy 0 0 0 - -  Change in appetite 0 0 0 - -  Feeling bad or failure about yourself  0 0 0 - -  Trouble concentrating 0 0 0 - -  Moving slowly or fidgety/restless 0 0 0 - -  Suicidal thoughts 0 0 0 - -  PHQ-9 Score 0 0 0 - -  Difficult doing work/chores Not difficult at all Not difficult at all Not difficult at all - -   Fall Risk  02/10/2019 10/21/2018 08/15/2018 04/07/2018 10/08/2017  Falls in the past year? 0 0 No No Yes  Number falls in past yr: - 0 - - 1  Injury with Fall? - - - - Yes  Risk for fall due to : - - History of fall(s) - -  Risk  for fall due to: Comment - - tripped over a speed bump - -    Relevant past medical, surgical, family and social history reviewed Past Medical History:  Diagnosis Date  . Chronic kidney disease   . Hernia, ventral   . Hyperlipidemia   . Hypertension   . Subclavian arterial stenosis (HCC)   . Thyroid disease    Past Surgical History:  Procedure Laterality Date  . artery bypass rechanneling  2010  . CHOLECYSTECTOMY    . CLOSED REDUCTION NASAL FRACTURE N/A 10/10/2017   Procedure: CLOSED REDUCTION NASAL BONE FRACTURE;  Surgeon: [name], MD;  Location: [facility] ORS;  Service: ENT;  Laterality: N/A;  . HERNIA REPAIR  03/05/2016   15 x 22 cm retrorectus [facility] mesh repair.  . INSERTION OF MESH N/A 03/05/2016   Procedure: INSERTION OF MESH;  Surgeon: [name], MD;  Location: [facility] ORS;  Service: General;  Laterality: N/A;  . VENTRAL HERNIA REPAIR N/A 03/05/2016  Procedure: HERNIA REPAIR VENTRAL ADULT;  Surgeon: [name], MD;  Location: [facility] ORS;  Service: General;  Laterality: N/A;   Family History  Problem Relation Age of Onset  . Heart disease Mother   . Hypertension Mother   . Heart disease Father   . Hypertension Father   . Hypertension Sister   . Diabetes Sister   . Cancer Sister        lung  . COPD Neg Hx   . Stroke Neg Hx    Social History   Tobacco Use  . Smoking status: Former Smoker    Packs/day: 2.00    Years: 50.00    Pack years: 100.00    Types: Cigarettes    Last attempt to quit: 12/03/2008    Years since quitting: 10.1  . Smokeless tobacco: Never Used  . Tobacco comment: smoking cessation materials not required  Substance Use Topics  . Alcohol use: No    Alcohol/week: 0.0 standard drinks  . Drug use: No     Office Visit from 02/10/2019 in [facility]  AUDIT-C Score  0      Interim medical history since last visit reviewed. Allergies and medications reviewed  Review of Systems Per HPI unless specifically  indicated above     Objective:    BP 110/72   Pulse (!) 55   Temp 98 F (36.7 C)   Resp 15   Ht  (1.575 m)   Wt 152 lb 8 oz (69.2 kg)   SpO2 98%   BMI 27.89 kg/m   Wt Readings from Last 3 Encounters:  02/10/19 152 lb 8 oz (69.2 kg)  10/21/18 161 lb 3.2 oz (73.1 kg)  08/15/18 166 lb 11.2 oz (75.6 kg)    Physical Exam Constitutional:      Appearance: She is well-developed.  Eyes:     General: No scleral icterus. Cardiovascular:     Rate and Rhythm: Normal rate and regular rhythm.  Pulmonary:     Effort: Pulmonary effort is normal.     Breath sounds: Normal breath sounds.  Musculoskeletal:     Right lower leg: No edema.     Left lower leg: No edema.  Neurological:     Mental Status: She is alert.  Psychiatric:        Behavior: Behavior normal.     Results for orders placed or performed in visit on 02/04/19  COMPLETE METABOLIC PANEL WITH GFR  Result Value Ref Range   Glucose, Bld 107 (H) 65 - 99 mg/dL   BUN 32 (H) 7 - 25 mg/dL   Creat 7.82 (H) 9.56 - 0.93 mg/dL   GFR, Est Non African American 48 (L) > OR = 60 mL/min/1.38m2   GFR, Est African American 55 (L) > OR = 60 mL/min/1.52m2   BUN/Creatinine Ratio 29 (H) 6 - 22 (calc)   Sodium 130 (L) 135 - 146 mmol/L   Potassium 4.0 3.5 - 5.3 mmol/L   Chloride 94 (L) 98 - 110 mmol/L   CO2 28 20 - 32 mmol/L   Calcium 9.8 8.6 - 10.4 mg/dL   Total Protein 6.6 6.1 - 8.1 g/dL   Albumin 3.9 3.6 - 5.1 g/dL   Globulin 2.7 1.9 - 3.7 g/dL (calc)   AG Ratio 1.4 1.0 - 2.5 (calc)   Total Bilirubin 0.4 0.2 - 1.2 mg/dL   Alkaline phosphatase (APISO) 65 37 - 153 U/L   AST 14 10 - 35 U/L   ALT 7  6 - 29 U/L  CBC  Result Value Ref Range   WBC 7.1 3.8 - 10.8 Thousand/uL   RBC 3.94 3.80 - 5.10 Million/uL   Hemoglobin 11.6 (L) 11.7 - 15.5 g/dL   HCT 27.7 (L) 41.2 - 87.8 %   MCV 86.5 80.0 - 100.0 fL   MCH 29.4 27.0 - 33.0 pg   MCHC 34.0 32.0 - 36.0 g/dL   RDW 67.6 72.0 - 94.7 %   Platelets 235 140 - 400 Thousand/uL   MPV 11.5  7.5 - 12.5 fL  TSH  Result Value Ref Range   TSH 0.29 (L) 0.40 - 4.50 mIU/L      Assessment & Plan:   Problem List Items Addressed This Visit      Endocrine   IFG (impaired fasting glucose)    Last A1c reviewed; not due for A1c yet; encouragement given for healthy eating and weight management      Hypothyroidism    Check thyroid function in 6 weeks after new dose of medicine        Genitourinary   CKD (chronic kidney disease) stage 3, GFR 30-59 ml/min (HCC)    Avoiding NSAIDs; see nephrologist once a year        Other   Overweight (BMI 25.0-29.9)    Praise given for weight loss; decreased the green tea intake; try to walk a little but start slow and build up gradually      DNAR (do not attempt resuscitation)    Confirmed again today that patient wishes to be DNR; ordered      Relevant Orders   DNR (Do Not Resuscitate)   Anemia    Anemia of CKD per Dr. Alice Rieger last note       Other Visit Diagnoses    Hyponatremia    -  Primary   Relevant Orders   BASIC METABOLIC PANEL WITH GFR       Follow up plan: Return in about 6 weeks (around 03/25/2019) for labs only; Dr. Sherie Don in 3 months.  An after-visit summary was printed and given to the patient at check-out.  Please see the patient instructions which may contain other information and recommendations beyond what is mentioned above in the assessment and plan.  Meds ordered this encounter  Medications  . losartan (COZAAR) 100 MG tablet    Sig: Take 1 tablet (100 mg total) by mouth daily.    Dispense:  90 tablet    Refill:  3    Increasing the dose    Orders Placed This Encounter  Procedures  . BASIC METABOLIC PANEL WITH GFR  . DNR (Do Not Resuscitate)

## 2019-02-11 NOTE — Progress Notes (Signed)
Janice [name], please let the patient know that her sodium and chloride levels are back to normal. Please fax results to her nephrologist.

## 2019-02-20 ENCOUNTER — Ambulatory Visit: Payer: Self-pay | Admitting: Family Medicine

## 2019-02-22 ENCOUNTER — Other Ambulatory Visit: Payer: Self-pay | Admitting: Family Medicine

## 2019-02-22 NOTE — Telephone Encounter (Signed)
Lab Results  Component Value Date   TSH 0.29 (L) 02/04/2019   Synthroid 112 mcg requested DENIED She should be on 100 mcg and that was sent after abnormal lab above was reviewed  levothyroxine (SYNTHROID, LEVOTHROID) 100 MCG tablet 30 tablet 1 02/07/2019    Sig - Route: Take 1 tablet (100 mcg total) by mouth daily. - Oral   Sent to pharmacy as: levothyroxine (SYNTHROID, LEVOTHROID) 100 MCG tablet   Notes to Pharmacy: New dose   E-Prescribing Status: Receipt confirmed by pharmacy (02/07/2019 6:55 PM EST)

## 2019-02-24 ENCOUNTER — Ambulatory Visit: Payer: Medicare Other

## 2019-02-24 ENCOUNTER — Other Ambulatory Visit: Payer: Self-pay

## 2019-02-24 VITALS — BP 142/72 | HR 61

## 2019-02-24 DIAGNOSIS — Z5181 Encounter for therapeutic drug level monitoring: Secondary | ICD-10-CM

## 2019-02-24 DIAGNOSIS — I10 Essential (primary) hypertension: Secondary | ICD-10-CM

## 2019-02-24 NOTE — Progress Notes (Signed)
Patient here for blood pressure recheck and BMP since uping dosage on Losartan. Blood pressure today is 142/72 and pulse 61.  Patient denies any side effects.  Janice [name] was consulted and she was told to continue current regimen.

## 2019-02-25 LAB — BASIC METABOLIC PANEL WITH GFR
BUN / CREAT RATIO: 17 (calc) (ref 6–22)
BUN: 20 mg/dL (ref 7–25)
CO2: 26 mmol/L (ref 20–32)
Calcium: 9.9 mg/dL (ref 8.6–10.4)
Chloride: 106 mmol/L (ref 98–110)
Creat: 1.2 mg/dL — ABNORMAL HIGH (ref 0.60–0.93)
GFR, Est African American: 50 mL/min/{1.73_m2} — ABNORMAL LOW (ref >=60)
GFR, Est Non African American: 43 mL/min/{1.73_m2} — ABNORMAL LOW (ref >=60)
GLUCOSE: 109 mg/dL — AB (ref 65–99)
Potassium: 4.9 mmol/L (ref 3.5–5.3)
Sodium: 138 mmol/L (ref 135–146)

## 2019-03-25 ENCOUNTER — Other Ambulatory Visit: Payer: Self-pay

## 2019-03-25 DIAGNOSIS — E039 Hypothyroidism, unspecified: Secondary | ICD-10-CM | POA: Diagnosis not present

## 2019-03-25 LAB — TSH: TSH: 4.12 mIU/L (ref 0.40–4.50)

## 2019-03-26 ENCOUNTER — Other Ambulatory Visit: Payer: Self-pay | Admitting: Family Medicine

## 2019-03-26 MED ORDER — LEVOTHYROXINE SODIUM 100 MCG PO TABS: 100.0000 ug | ORAL_TABLET | Freq: Every day | ORAL | 3 refills | Status: DC

## 2019-03-26 NOTE — Progress Notes (Signed)
Refills sent

## 2019-03-26 NOTE — Telephone Encounter (Signed)
Lab Results  Component Value Date   ALT 7 02/04/2019   Lab Results  Component Value Date   CHOL 135 10/21/2018   HDL 48 (L) 10/21/2018   LDLCALC 69 10/21/2018   TRIG 96 10/21/2018   CHOLHDL 2.8 10/21/2018

## 2019-04-17 ENCOUNTER — Other Ambulatory Visit: Payer: Self-pay | Admitting: Family Medicine

## 2019-04-21 ENCOUNTER — Ambulatory Visit: Payer: Medicare Other | Admitting: Family Medicine

## 2019-04-24 ENCOUNTER — Other Ambulatory Visit: Payer: Self-pay | Admitting: Family Medicine

## 2019-05-07 DIAGNOSIS — N183 Chronic kidney disease, stage 3 (moderate): Secondary | ICD-10-CM | POA: Diagnosis not present

## 2019-05-07 DIAGNOSIS — I129 Hypertensive chronic kidney disease with stage 1 through stage 4 chronic kidney disease, or unspecified chronic kidney disease: Secondary | ICD-10-CM | POA: Diagnosis not present

## 2019-05-07 DIAGNOSIS — D631 Anemia in chronic kidney disease: Secondary | ICD-10-CM | POA: Diagnosis not present

## 2019-05-07 DIAGNOSIS — N2581 Secondary hyperparathyroidism of renal origin: Secondary | ICD-10-CM | POA: Diagnosis not present

## 2019-05-07 DIAGNOSIS — I10 Essential (primary) hypertension: Secondary | ICD-10-CM | POA: Diagnosis not present

## 2019-05-11 DIAGNOSIS — I129 Hypertensive chronic kidney disease with stage 1 through stage 4 chronic kidney disease, or unspecified chronic kidney disease: Secondary | ICD-10-CM | POA: Diagnosis not present

## 2019-05-11 DIAGNOSIS — N183 Chronic kidney disease, stage 3 (moderate): Secondary | ICD-10-CM | POA: Diagnosis not present

## 2019-05-11 DIAGNOSIS — R809 Proteinuria, unspecified: Secondary | ICD-10-CM | POA: Diagnosis not present

## 2019-05-11 DIAGNOSIS — N2581 Secondary hyperparathyroidism of renal origin: Secondary | ICD-10-CM | POA: Diagnosis not present

## 2019-05-25 ENCOUNTER — Ambulatory Visit: Payer: Medicare Other | Admitting: Family Medicine

## 2019-08-18 ENCOUNTER — Ambulatory Visit (INDEPENDENT_AMBULATORY_CARE_PROVIDER_SITE_OTHER): Payer: Medicare Other

## 2019-08-18 ENCOUNTER — Other Ambulatory Visit: Payer: Self-pay

## 2019-08-18 VITALS — BP 142/79 | Ht 62.0 in | Wt 148.0 lb

## 2019-08-18 DIAGNOSIS — Z0000 Encounter for general adult medical examination without abnormal findings: Secondary | ICD-10-CM | POA: Diagnosis not present

## 2019-08-18 NOTE — Patient Instructions (Signed)
Janice [name] , Thank you for taking time to come for your Medicare Wellness Visit. I appreciate your ongoing commitment to your health goals. Please review the following plan we discussed and let me know if I can assist you in the future.   Screening recommendations/referrals: Colonoscopy: postponed  Mammogram: postponed Bone Density: postponed Recommended yearly ophthalmology/optometry visit for glaucoma screening and checkup Recommended yearly dental visit for hygiene and checkup  Vaccinations: Influenza vaccine: done 08/15/18 Pneumococcal vaccine: done 11/15/14 Tdap vaccine: due - please contact us if you get a cut or scrape Shingles vaccine: Shingrix discussed. Please contact your pharmacy for coverage information.   Advanced directives: Advance directive discussed with you today. I have provided a copy for you to complete at home and have notarized. Once this is complete please bring a copy in to our office so we can scan it into your chart.  Conditions/risks identified: Keep up the great work!  Next appointment: Please follow up in one year for your Medicare Annual Wellness visit.     Preventive Care 80 Years and Older, Female Preventive care refers to lifestyle choices and visits with your health care provider that can promote health and wellness. What does preventive care include?  A yearly physical exam. This is also called an annual well check.  Dental exams once or twice a year.  Routine eye exams. Ask your health care provider how often you should have your eyes checked.  Personal lifestyle choices, including:  Daily care of your teeth and gums.  Regular physical activity.  Eating a healthy diet.  Avoiding tobacco and drug use.  Limiting alcohol use.  Practicing safe sex.  Taking low-dose aspirin every day.  Taking vitamin and mineral supplements as recommended by your health care provider. What happens during an annual well check? The services and  screenings done by your health care provider during your annual well check will depend on your age, overall health, lifestyle risk factors, and family history of disease. Counseling  Your health care provider may ask you questions about your:  Alcohol use.  Tobacco use.  Drug use.  Emotional well-being.  Home and relationship well-being.  Sexual activity.  Eating habits.  History of falls.  Memory and ability to understand (cognition).  Work and work Statistician.  Reproductive health. Screening  You may have the following tests or measurements:  Height, weight, and BMI.  Blood pressure.  Lipid and cholesterol levels. These may be checked every 5 years, or more frequently if you are over 49 years old.  Skin check.  Lung cancer screening. You may have this screening every year starting at age 80 if you have a 30-pack-year history of smoking and currently smoke or have quit within the past 15 years.  Fecal occult blood test (FOBT) of the stool. You may have this test every year starting at age 80.  Flexible sigmoidoscopy or colonoscopy. You may have a sigmoidoscopy every 5 years or a colonoscopy every 10 years starting at age 80.  Hepatitis C blood test.  Hepatitis B blood test.  Sexually transmitted disease (STD) testing.  Diabetes screening. This is done by checking your blood sugar (glucose) after you have not eaten for a while (fasting). You may have this done every 1-3 years.  Bone density scan. This is done to screen for osteoporosis. You may have this done starting at age 80.  Mammogram. This may be done every 1-2 years. Talk to your health care provider about how often you should have  regular mammograms. Talk with your health care provider about your test results, treatment options, and if necessary, the need for more tests. Vaccines  Your health care provider may recommend certain vaccines, such as:  Influenza vaccine. This is recommended every year.   Tetanus, diphtheria, and acellular pertussis (Tdap, Td) vaccine. You may need a Td booster every 10 years.  Zoster vaccine. You may need this after age 42.  Pneumococcal 13-valent conjugate (PCV13) vaccine. One dose is recommended after age 80.  Pneumococcal polysaccharide (PPSV23) vaccine. One dose is recommended after age 80. Talk to your health care provider about which screenings and vaccines you need and how often you need them. This information is not intended to replace advice given to you by your health care provider. Make sure you discuss any questions you have with your health care provider. Document Released: 12/16/2015 Document Revised: 08/08/2016 Document Reviewed: 09/20/2015 Elsevier Interactive Patient Education  2017 [place] Prevention in the Home Falls can cause injuries. They can happen to people of all ages. There are many things you can do to make your home safe and to help prevent falls. What can I do on the outside of my home?  Regularly fix the edges of walkways and driveways and fix any cracks.  Remove anything that might make you trip as you walk through a door, such as a raised step or threshold.  Trim any bushes or trees on the path to your home.  Use bright outdoor lighting.  Clear any walking paths of anything that might make someone trip, such as rocks or tools.  Regularly check to see if handrails are loose or broken. Make sure that both sides of any steps have handrails.  Any raised decks and porches should have guardrails on the edges.  Have any leaves, snow, or ice cleared regularly.  Use sand or salt on walking paths during winter.  Clean up any spills in your garage right away. This includes oil or grease spills. What can I do in the bathroom?  Use night lights.  Install grab bars by the toilet and in the tub and shower. Do not use towel bars as grab bars.  Use non-skid mats or decals in the tub or shower.  If you need to sit  down in the shower, use a plastic, non-slip stool.  Keep the floor dry. Clean up any water that spills on the floor as soon as it happens.  Remove soap buildup in the tub or shower regularly.  Attach bath mats securely with double-sided non-slip rug tape.  Do not have throw rugs and other things on the floor that can make you trip. What can I do in the bedroom?  Use night lights.  Make sure that you have a light by your bed that is easy to reach.  Do not use any sheets or blankets that are too big for your bed. They should not hang down onto the floor.  Have a firm chair that has side arms. You can use this for support while you get dressed.  Do not have throw rugs and other things on the floor that can make you trip. What can I do in the kitchen?  Clean up any spills right away.  Avoid walking on wet floors.  Keep items that you use a lot in easy-to-reach places.  If you need to reach something above you, use a strong step stool that has a grab bar.  Keep electrical cords out of the  way.  Do not use floor polish or wax that makes floors slippery. If you must use wax, use non-skid floor wax.  Do not have throw rugs and other things on the floor that can make you trip. What can I do with my stairs?  Do not leave any items on the stairs.  Make sure that there are handrails on both sides of the stairs and use them. Fix handrails that are broken or loose. Make sure that handrails are as long as the stairways.  Check any carpeting to make sure that it is firmly attached to the stairs. Fix any carpet that is loose or worn.  Avoid having throw rugs at the top or bottom of the stairs. If you do have throw rugs, attach them to the floor with carpet tape.  Make sure that you have a light switch at the top of the stairs and the bottom of the stairs. If you do not have them, ask someone to add them for you. What else can I do to help prevent falls?  Wear shoes that:  Do not have  high heels.  Have rubber bottoms.  Are comfortable and fit you well.  Are closed at the toe. Do not wear sandals.  If you use a stepladder:  Make sure that it is fully opened. Do not climb a closed stepladder.  Make sure that both sides of the stepladder are locked into place.  Ask someone to hold it for you, if possible.  Clearly mark and make sure that you can see:  Any grab bars or handrails.  First and last steps.  Where the edge of each step is.  Use tools that help you move around (mobility aids) if they are needed. These include:  Canes.  Walkers.  Scooters.  Crutches.  Turn on the lights when you go into a dark area. Replace any light bulbs as soon as they burn out.  Set up your furniture so you have a clear path. Avoid moving your furniture around.  If any of your floors are uneven, fix them.  If there are any pets around you, be aware of where they are.  Review your medicines with your doctor. Some medicines can make you feel dizzy. This can increase your chance of falling. Ask your doctor what other things that you can do to help prevent falls. This information is not intended to replace advice given to you by your health care provider. Make sure you discuss any questions you have with your health care provider. Document Released: 09/15/2009 Document Revised: 04/26/2016 Document Reviewed: 12/24/2014 Elsevier Interactive Patient Education  2017 [employer].

## 2019-08-18 NOTE — Progress Notes (Signed)
Subjective:   [name] is a 78 y.o. female who presents for Medicare Annual (Subsequent) preventive examination.  Virtual Visit via Telephone Note  I connected with [name] on 08/18/19 at  2:50 PM EDT by telephone and verified that I am speaking with the correct person using two identifiers.  Medicare Annual Wellness visit completed telephonically due to Covid-19 pandemic.   Location: Patient: home Provider: office   I discussed the limitations, risks, security and privacy concerns of performing an evaluation and management service by telephone and the availability of in person appointments. The patient expressed understanding and agreed to proceed.  Some vital signs may be absent or patient reported.   [name], LPN    Review of Systems:   Cardiac Risk Factors include: advanced age (>8men, >66 women);dyslipidemia;hypertension     Objective:     Vitals: BP (!) 142/79   Ht 5\' 2"  (1.575 m)   Wt 148 lb (67.1 kg)   BMI 27.07 kg/m   Body mass index is 27.07 kg/m.  Advanced Directives 08/18/2019 08/15/2018 10/10/2017 09/28/2017 05/09/2017 11/21/2016 05/22/2016  Does Patient Have a Medical Advance Directive? No Yes No No No No No  Type of Advance Directive - Healthcare Power of [place];Living will - - - - -  Copy of Healthcare Power of Attorney in Chart? - No - copy requested - - - - -  Would patient like information on creating a medical advance directive? Yes (MAU/Ambulatory/Procedural Areas - Information given) - No - Patient declined No - Patient declined - - No - patient declined information    Tobacco Social History   Tobacco Use  Smoking Status Former Smoker  . Packs/day: 2.00  . Years: 50.00  . Pack years: 100.00  . Types: Cigarettes  . Quit date: 12/03/2008  . Years since quitting: 10.7  Smokeless Tobacco Never Used  Tobacco Comment   smoking cessation materials not required     Counseling given: Not Answered Comment: smoking cessation  materials not required   Clinical Intake:  Pre-visit preparation completed: Yes  Pain : No/denies pain     BMI - recorded: 27.07 Nutritional Status: BMI 25 -29 Overweight Nutritional Risks: None Diabetes: No  How often do you need to have someone help you when you read instructions, pamphlets, or other written materials from your doctor or pharmacy?: 1 - Never  Interpreter Needed?: No  Information entered by :: [name] LPN  Past Medical History:  Diagnosis Date  . Chronic kidney disease   . Hernia, ventral   . Hyperlipidemia   . Hypertension   . Subclavian arterial stenosis (HCC)   . Thyroid disease    Past Surgical History:  Procedure Laterality Date  . artery bypass rechanneling  2010  . CHOLECYSTECTOMY    . CLOSED REDUCTION NASAL FRACTURE N/A 10/10/2017   Procedure: CLOSED REDUCTION NASAL BONE FRACTURE;  Surgeon: [name], MD;  Location: [facility] ORS;  Service: ENT;  Laterality: N/A;  . HERNIA REPAIR  03/05/2016   15 x 22 cm retrorectus [facility] mesh repair.  . INSERTION OF MESH N/A 03/05/2016   Procedure: INSERTION OF MESH;  Surgeon: [name], MD;  Location: [facility] ORS;  Service: General;  Laterality: N/A;  . VENTRAL HERNIA REPAIR N/A 03/05/2016   Procedure: HERNIA REPAIR VENTRAL ADULT;  Surgeon: [name], MD;  Location: [facility] ORS;  Service: General;  Laterality: N/A;   Family History  Problem Relation Age of Onset  . Heart disease Mother   .  Hypertension Mother   . Heart disease Father   . Hypertension Father   . Hypertension Sister   . Diabetes Sister   . Cancer Sister        lung  . COPD Neg Hx   . Stroke Neg Hx    Social History   Socioeconomic History  . Marital status: Widowed    Spouse name: Not on file  . Number of children: 4  . Years of education: some college  . Highest education level: 12th grade  Occupational History  . Occupation: Retired  Engineer, productionocial Needs  . Financial resource strain: Not hard at all  . Food  insecurity    Worry: Never true    Inability: Never true  . Transportation needs    Medical: No    Non-medical: No  Tobacco Use  . Smoking status: Former Smoker    Packs/day: 2.00    Years: 50.00    Pack years: 100.00    Types: Cigarettes    Quit date: 12/03/2008    Years since quitting: 10.7  . Smokeless tobacco: Never Used  . Tobacco comment: smoking cessation materials not required  Substance and Sexual Activity  . Alcohol use: No    Alcohol/week: 0.0 standard drinks  . Drug use: No  . Sexual activity: Not Currently  Lifestyle  . Physical activity    Days per week: 7 days    Minutes per session: 20 min  . Stress: Not at all  Relationships  . Social Musicianconnections    Talks on phone: Patient refused    Gets together: Patient refused    Attends religious service: Patient refused    Active member of club or organization: Patient refused    Attends meetings of clubs or organizations: Patient refused    Relationship status: Divorced  Other Topics Concern  . Not on file  Social History Narrative  . Not on file    Outpatient Encounter Medications as of 08/18/2019  Medication Sig  . amLODipine (NORVASC) 10 MG tablet Take 1 tablet (10 mg total) by mouth daily.  Marland Kitchen. aspirin EC 81 MG tablet Take 81 mg daily by mouth.  . Cholecalciferol (VITAMIN D3) 1000 UNITS CAPS Take 1,000 Int'l Units by mouth daily.  Marland Kitchen. levothyroxine (SYNTHROID) 100 MCG tablet Take 1 tablet (100 mcg total) by mouth daily.  Marland Kitchen. losartan (COZAAR) 100 MG tablet Take 1 tablet (100 mg total) by mouth daily.  . metoprolol tartrate (LOPRESSOR) 25 MG tablet TAKE 1 TABLET (25 MG TOTAL) BY MOUTH 2 (TWO) TIMES DAILY.  . rosuvastatin (CRESTOR) 20 MG tablet TAKE 1 TABLET BY MOUTH EVERYDAY AT BEDTIME   No facility-administered encounter medications on file as of 08/18/2019.     Activities of Daily Living In your present state of health, do you have any difficulty performing the following activities: 08/18/2019 02/10/2019   Hearing? N N  Comment declines hearing aids -  Vision? N N  Difficulty concentrating or making decisions? N N  Walking or climbing stairs? N N  Dressing or bathing? N N  Doing errands, shopping? N N  Preparing Food and eating ? N -  Using the Toilet? N -  In the past six months, have you accidently leaked urine? N -  Do you have problems with loss of bowel control? N -  Managing your Medications? N -  Managing your Finances? N -  Housekeeping or managing your Housekeeping? N -  Some recent data might be hidden    Patient Care  Team: Kerman PasseyLada, Melinda P, MD as PCP - General (Family Medicine) Lada, Janit BernMelinda P, MD as PCP - Family Medicine (Family Medicine) Lemar LivingsByrnett, Merrily PewJeffrey W, MD as Consulting Physician (General Surgery) Lamont DowdyKolluru, Sarath, MD as Consulting Physician (Internal Medicine)    Assessment:   This is a routine wellness examination for Janice [name].  Exercise Activities and Dietary recommendations Current Exercise Habits: Home exercise routine, Type of exercise: walking, Time (Minutes): 20, Frequency (Times/Week): 7, Weekly Exercise (Minutes/Week): 140, Intensity: Mild, Exercise limited by: None identified  Goals    . diet     Recommend eating 3 small healthy meals and at least 2 healthy snacks per day       Fall Risk Fall Risk  08/18/2019 02/10/2019 10/21/2018 08/15/2018 04/07/2018  Falls in the past year? 0 0 0 No No  Number falls in past yr: 0 - 0 - -  Injury with Fall? 0 - - - -  Risk for fall due to : - - - History of fall(s) -  Risk for fall due to: Comment - - - tripped over a speed bump -  Follow up Falls prevention discussed - - - -   FALL RISK PREVENTION PERTAINING TO THE HOME:  Any stairs in or around the home? Yes  If so, do they handrails? Yes   Home free of loose throw rugs in walkways, pet beds, electrical cords, etc? Yes  Adequate lighting in your home to reduce risk of falls? Yes   ASSISTIVE DEVICES UTILIZED TO PREVENT FALLS:  Life alert? No  Use of a cane,  walker or w/c? No  Grab bars in the bathroom? No  Shower chair or bench in shower? No  Elevated toilet seat or a handicapped toilet? No   DME ORDERS:  DME order needed?  No   TIMED UP AND GO:  Was the test performed? No . Telephonic visit.   Education: Fall risk prevention has been discussed.  Intervention(s) required? No   Depression Screen PHQ 2/9 Scores 08/18/2019 02/10/2019 10/21/2018 08/15/2018  PHQ - 2 Score 0 0 0 0  PHQ- 9 Score - 0 0 0     Cognitive Function     6CIT Screen 08/18/2019 08/15/2018  What Year? 0 points 0 points  What month? 0 points 0 points  What time? 0 points 3 points  Count back from 20 0 points 0 points  Months in reverse 0 points 0 points  Repeat phrase 0 points 4 points  Total Score 0 7    Immunization History  Administered Date(s) Administered  . Influenza, High Dose Seasonal PF 10/08/2017, 08/15/2018  . Influenza,inj,Quad PF,6+ Mos 10/17/2016  . Influenza-Unspecified 11/15/2014  . Pneumococcal Conjugate-13 11/15/2014  . Td 12/03/2008    Qualifies for Shingles Vaccine? Yes  . Due for Shingrix. Education has been provided regarding the importance of this vaccine. Pt has been advised to call insurance company to determine out of pocket expense. Advised may also receive vaccine at local pharmacy or Health Dept. Verbalized acceptance and understanding.  Tdap: Although this vaccine is not a covered service during a Wellness Exam, does the patient still wish to receive this vaccine today?  No .  Education has been provided regarding the importance of this vaccine. Advised may receive this vaccine at local pharmacy or Health Dept. Aware to provide a copy of the vaccination record if obtained from local pharmacy or Health Dept. Verbalized acceptance and understanding.  Flu Vaccine: Due for Flu vaccine. Does the patient want to receive this  vaccine today?  No . Education has been provided regarding the importance of this vaccine but still declined.  Advised may receive this vaccine at local pharmacy or Health Dept. Aware to provide a copy of the vaccination record if obtained from local pharmacy or Health Dept. Verbalized acceptance and understanding.  Pneumococcal Vaccine: Up to date   Screening Tests Health Maintenance  Topic Date Due  . DEXA SCAN  09/18/2004  . INFLUENZA VACCINE  07/04/2019  . TETANUS/TDAP  02/10/2020 (Originally 12/03/2018)  . PNA vac Low Risk Adult  Completed   Cancer Screenings:  Colorectal Screening: pt declines  Mammogram: pt declines  Bone Density: pt declines  Lung Cancer Screening: (Low Dose CT Chest recommended if Age 61-80 years, 30 pack-year currently smoking OR have quit w/in 15years.) does not qualify.   Additional Screening:  Hepatitis C Screening: no longer required  Vision Screening: Recommended annual ophthalmology exams for early detection of glaucoma and other disorders of the eye. Is the patient up to date with their annual eye exam?  No  Who is the provider or what is the name of the office in which the pt attends annual eye exams? Not established If pt is not established with a provider, would they like to be referred to a provider to establish care? No .   Dental Screening: Recommended annual dental exams for proper oral hygiene  Community Resource Referral:  CRR required this visit?  No      Plan:     I have personally reviewed and addressed the Medicare Annual Wellness questionnaire and have noted the following in the patient's chart:  A. Medical and social history B. Use of alcohol, tobacco or illicit drugs  C. Current medications and supplements D. Functional ability and status E.  Nutritional status F.  Physical activity G. Advance directives H. List of other physicians I.  Hospitalizations, surgeries, and ER visits in previous 12 months J.  [place] such as hearing and vision if needed, cognitive and depression L. Referrals and appointments   In  addition, I have reviewed and discussed with patient certain preventive protocols, quality metrics, and best practice recommendations. A written personalized care plan for preventive services as well as general preventive health recommendations were provided to patient.   Signed,  [name], LPN Nurse Health Advisor   Nurse Notes: none

## 2019-09-04 ENCOUNTER — Ambulatory Visit (INDEPENDENT_AMBULATORY_CARE_PROVIDER_SITE_OTHER): Payer: Medicare Other | Admitting: Family Medicine

## 2019-09-04 ENCOUNTER — Encounter: Payer: Self-pay | Admitting: Family Medicine

## 2019-09-04 ENCOUNTER — Other Ambulatory Visit: Payer: Self-pay

## 2019-09-04 VITALS — BP 132/78 | HR 58 | Temp 98.5°F | Resp 14 | Ht 62.0 in | Wt 153.6 lb

## 2019-09-04 DIAGNOSIS — D631 Anemia in chronic kidney disease: Secondary | ICD-10-CM

## 2019-09-04 DIAGNOSIS — E782 Mixed hyperlipidemia: Secondary | ICD-10-CM

## 2019-09-04 DIAGNOSIS — N1831 Chronic kidney disease, stage 3a: Secondary | ICD-10-CM | POA: Diagnosis not present

## 2019-09-04 DIAGNOSIS — Z23 Encounter for immunization: Secondary | ICD-10-CM | POA: Diagnosis not present

## 2019-09-04 DIAGNOSIS — E039 Hypothyroidism, unspecified: Secondary | ICD-10-CM

## 2019-09-04 DIAGNOSIS — Z5181 Encounter for therapeutic drug level monitoring: Secondary | ICD-10-CM | POA: Diagnosis not present

## 2019-09-04 DIAGNOSIS — I129 Hypertensive chronic kidney disease with stage 1 through stage 4 chronic kidney disease, or unspecified chronic kidney disease: Secondary | ICD-10-CM | POA: Diagnosis not present

## 2019-09-04 NOTE — Progress Notes (Signed)
Name: Janice [name]   MRN: [id number]    DOB: 1941/03/05   Date:09/04/2019       Progress Note  Chief Complaint  Patient presents with  . Follow-up  . Hyperlipidemia  . Hypertension  . Hypothyroidism     Subjective:   Janice [name] is a 78 y.o. female, presents to clinic for routine follow up on the conditions listed above.  Hypertension:  Currently managed on norvasc, losartan and metoprolol Pt reports good med compliance and denies any SE.  No lightheadedness, hypotension, syncope. Blood pressure today is well controlled. BP Readings from Last 3 Encounters:  09/04/19 132/78  08/18/19 (!) 142/79  02/24/19 (!) 142/72   Pt denies CP, SOB, exertional sx, LE edema, palpitation, Ha's, visual disturbances Dietary efforts for BP?  Low salt  Hyperlipidemia:  Current Medication Regimen:  crestor 20 mg Last Lipids: Lab Results  Component Value Date   CHOL 135 10/21/2018   HDL 48 (L) 10/21/2018   LDLCALC 69 10/21/2018   TRIG 96 10/21/2018   CHOLHDL 2.8 10/21/2018   - Denies: Chest pain, shortness of breath, myalgias. - Documented aortic atherosclerosis? Yes - Risk factors for atherosclerosis: arteriosclerotic heart disease, hypercholesterolemia and hypertension  Pt has hx of hypothyroid, no sx, levothyroxine dose 100 mcg, no SE of meds, good compliance  Hx of CKD  Patient Active Problem List   Diagnosis Date Noted  . Hypertensive renal disease 05/20/2018  . Anemia of chronic renal failure 05/20/2018  . 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 Anemia 04/07/2018  . Microalbuminuria 10/09/2017  . CKD (chronic kidney disease) stage 3, GFR 30-59 ml/min 10/09/2017  . DNAR (do not attempt resuscitation) 10/08/2017  . Peripheral vascular disease (HCC) 02/01/2016  . LBBB (left bundle branch block) 01/23/2016  . Hypothyroidism 11/21/2015  . Medication monitoring encounter 11/21/2015  . Atherosclerosis of arteries 05/23/2015  . IFG (impaired fasting  glucose) 05/23/2015  . Hypercholesteremia 05/23/2015  . Overweight (BMI 25.0-29.9) 05/23/2015    Past Surgical History:  Procedure Laterality Date  . artery bypass rechanneling  2010  . CHOLECYSTECTOMY    . CLOSED REDUCTION NASAL FRACTURE N/A 10/10/2017   Procedure: CLOSED REDUCTION NASAL BONE FRACTURE;  Surgeon: [name], MD;  Location: [facility] ORS;  Service: ENT;  Laterality: N/A;  . HERNIA REPAIR  03/05/2016   15 x 22 cm retrorectus Atrium mesh repair.  . INSERTION OF MESH N/A 03/05/2016   Procedure: INSERTION OF MESH;  Surgeon: [name], MD;  Location: [facility] ORS;  Service: General;  Laterality: N/A;  . VENTRAL HERNIA REPAIR N/A 03/05/2016   Procedure: HERNIA REPAIR VENTRAL ADULT;  Surgeon: [name], MD;  Location: [facility] ORS;  Service: General;  Laterality: N/A;    Family History  Problem Relation Age of Onset  . Heart disease Mother   . Hypertension Mother   . Heart disease Father   . Hypertension Father   . Hypertension Sister   . Diabetes Sister   . Cancer Sister        lung  . COPD Neg Hx   . Stroke Neg Hx     Social History   Socioeconomic History  . Marital status: Widowed    Spouse name: Not on file  . Number of children: 4  . Years of education: some college  . Highest education level: 12th grade  Occupational History  . Occupation: Retired  Engineer, production  . Financial resource strain: Not hard at all  . Food insecurity  Worry: Never true    Inability: Never true  . Transportation needs    Medical: No    Non-medical: No  Tobacco Use  . Smoking status: Former Smoker    Packs/day: 2.00    Years: 50.00    Pack years: 100.00    Types: Cigarettes    Quit date: 12/03/2008    Years since quitting: 10.7  . Smokeless tobacco: Never Used  . Tobacco comment: smoking cessation materials not required  Substance and Sexual Activity  . Alcohol use: No    Alcohol/week: 0.0 standard drinks  . Drug use: No  . Sexual activity: Not Currently   Lifestyle  . Physical activity    Days per week: 7 days    Minutes per session: 20 min  . Stress: Not at all  Relationships  . Social Herbalist on phone: Patient refused    Gets together: Patient refused    Attends religious service: Patient refused    Active member of club or organization: Patient refused    Attends meetings of clubs or organizations: Patient refused    Relationship status: Divorced  . Intimate partner violence    Fear of current or ex partner: No    Emotionally abused: No    Physically abused: No    Forced sexual activity: No  Other Topics Concern  . Not on file  Social History Narrative  . Not on file     Current Outpatient Medications:  .  amLODipine (NORVASC) 10 MG tablet, Take 1 tablet (10 mg total) by mouth daily., Disp: 90 tablet, Rfl: 3 .  aspirin EC 81 MG tablet, Take 81 mg daily by mouth., Disp: , Rfl:  .  Cholecalciferol (VITAMIN D3) 1000 UNITS CAPS, Take 1,000 Int'l Units by mouth daily., Disp: , Rfl:  .  levothyroxine (SYNTHROID) 100 MCG tablet, Take 1 tablet (100 mcg total) by mouth daily., Disp: 90 tablet, Rfl: 3 .  losartan (COZAAR) 100 MG tablet, Take 1 tablet (100 mg total) by mouth daily., Disp: 90 tablet, Rfl: 3 .  metoprolol tartrate (LOPRESSOR) 25 MG tablet, TAKE 1 TABLET (25 MG TOTAL) BY MOUTH 2 (TWO) TIMES DAILY., Disp: 180 tablet, Rfl: 1 .  rosuvastatin (CRESTOR) 20 MG tablet, TAKE 1 TABLET BY MOUTH EVERYDAY AT BEDTIME, Disp: 90 tablet, Rfl: 1  No Known Allergies  I personally reviewed active problem list, medication list, allergies, family history, social history, health maintenance, notes from last encounter, lab results with the patient/caregiver today.  Review of Systems  Constitutional: Negative.   HENT: Negative.   Eyes: Negative.   Respiratory: Negative.   Cardiovascular: Negative.   Gastrointestinal: Negative.   Endocrine: Negative.   Genitourinary: Negative.   Musculoskeletal: Negative.   Skin: Negative.    Allergic/Immunologic: Negative.   Neurological: Negative.   Hematological: Negative.   Psychiatric/Behavioral: Negative.   All other systems reviewed and are negative.    Objective:    Vitals:   09/04/19 0910  BP: 132/78  Pulse: (!) 58  Resp: 14  Temp: 98.5 F (36.9 C)  SpO2: 98%  Weight: 153 lb 9.6 oz (69.7 kg)  Height: 5\' 2"  (1.575 m)    Body mass index is 28.09 kg/m.  Physical Exam Vitals signs and nursing note reviewed.  Constitutional:      General: She is not in acute distress.    Appearance: Normal appearance. She is well-developed. She is not ill-appearing, toxic-appearing or diaphoretic.     Interventions: Face mask in place.  HENT:     Head: Normocephalic and atraumatic.     Right Ear: External ear normal.     Left Ear: External ear normal.  Eyes:     General: Lids are normal. No scleral icterus.       Right eye: No discharge.        Left eye: No discharge.     Conjunctiva/sclera: Conjunctivae normal.  Neck:     Musculoskeletal: Normal range of motion and neck supple.     Trachea: Phonation normal. No tracheal deviation.  Cardiovascular:     Rate and Rhythm: Regular rhythm. Bradycardia present.     Pulses: Normal pulses.          Radial pulses are 2+ on the right side and 2+ on the left side.       Posterior tibial pulses are 2+ on the right side and 2+ on the left side.     Heart sounds: Normal heart sounds. No murmur. No friction rub. No gallop.   Pulmonary:     Effort: Pulmonary effort is normal. No respiratory distress.     Breath sounds: Normal breath sounds. No stridor. No wheezing, rhonchi or rales.  Chest:     Chest wall: No tenderness.  Abdominal:     General: Bowel sounds are normal. There is no distension.     Palpations: Abdomen is soft.     Tenderness: There is no abdominal tenderness. There is no guarding or rebound.  Musculoskeletal: Normal range of motion.        General: No deformity.     Right lower leg: No edema.     Left  lower leg: No edema.  Lymphadenopathy:     Cervical: No cervical adenopathy.  Skin:    General: Skin is warm and dry.     Coloration: Skin is not jaundiced or pale.     Findings: No rash.  Neurological:     Mental Status: She is alert.     Motor: No abnormal muscle tone.     Gait: Gait normal.  Psychiatric:        Mood and Affect: Mood normal.        Speech: Speech normal.        Behavior: Behavior normal.      No results found for this or any previous visit (from the past 2160 hour(s)).   PHQ2/9: Depression screen [facility] 2/9 09/04/2019 08/18/2019 02/10/2019 10/21/2018 08/15/2018  Decreased Interest 0 0 0 0 0  Down, Depressed, Hopeless 0 0 0 0 0  PHQ - 2 Score 0 0 0 0 0  Altered sleeping 0 - 0 0 0  Tired, decreased energy 0 - 0 0 0  Change in appetite 0 - 0 0 0  Feeling bad or failure about yourself  0 - 0 0 0  Trouble concentrating 0 - 0 0 0  Moving slowly or fidgety/restless 0 - 0 0 0  Suicidal thoughts 0 - 0 0 0  PHQ-9 Score 0 - 0 0 0  Difficult doing work/chores Not difficult at all - Not difficult at all Not difficult at all Not difficult at all    phq 9 is negative Reviewed today  Fall Risk: Fall Risk  09/04/2019 08/18/2019 02/10/2019 10/21/2018 08/15/2018  Falls in the past year? 0 0 0 0 No  Number falls in past yr: 0 0 - 0 -  Injury with Fall? 0 0 - - -  Risk for fall due to : - - - -  History of fall(s)  Risk for fall due to: Comment - - - - tripped over a speed bump  Follow up - Falls prevention discussed - - -   Functional Status Survey: Is the patient deaf or have difficulty hearing?: No Does the patient have difficulty seeing, even when wearing glasses/contacts?: No Does the patient have difficulty concentrating, remembering, or making decisions?: No Does the patient have difficulty walking or climbing stairs?: No Does the patient have difficulty dressing or bathing?: No Does the patient have difficulty doing errands alone such as visiting a doctor's office or  shopping?: No  Assessment & Plan:       ICD-10-CM   1. Hypertensive renal disease  I12.9 COMPLETE METABOLIC PANEL WITH GFR   well controlled, check labs, continue meds  2. Mixed hyperlipidemia  E78.2 Lipid panel    COMPLETE METABOLIC PANEL WITH GFR   compliant with meds, no concerns, check labs  3. Stage 3a chronic kidney disease  N18.31 COMPLETE METABOLIC PANEL WITH GFR   monitor renal function  4. Anemia due to stage 3a chronic kidney disease  N18.31 CBC with Differential/Platelet   D63.1    check cbc  5. Hypothyroidism, unspecified type  E03.9 TSH   check tsh  6. Medication monitoring encounter  Z51.81 TSH    Lipid panel    CBC with Differential/Platelet    COMPLETE METABOLIC PANEL WITH GFR  7. Need for influenza vaccination  Z23 Flu Vaccine QUAD High Dose(Fluad)  8. Need for pneumococcal vaccination  Z23 Pneumococcal polysaccharide vaccine 23-valent greater than or equal to 2yo subcutaneous/IM   per chart needs pneumovax 23, had 13 in 2009     Return in about 6 months (around 03/04/2020) for Routine follow-up.   Danelle BerryLeisa Caydyn Sprung, PA-C 09/04/19 9:28 AM

## 2019-09-05 LAB — COMPLETE METABOLIC PANEL WITH GFR
AG Ratio: 1.5 (calc) (ref 1.0–2.5)
ALT: 11 U/L (ref 6–29)
AST: 17 U/L (ref 10–35)
Albumin: 4.1 g/dL (ref 3.6–5.1)
Alkaline phosphatase (APISO): 63 U/L (ref 37–153)
BUN/Creatinine Ratio: 16 (calc) (ref 6–22)
BUN: 20 mg/dL (ref 7–25)
CO2: 23 mmol/L (ref 20–32)
Calcium: 9.9 mg/dL (ref 8.6–10.4)
Chloride: 105 mmol/L (ref 98–110)
Creat: 1.24 mg/dL — ABNORMAL HIGH (ref 0.60–0.93)
GFR, Est African American: 48 mL/min/{1.73_m2} — ABNORMAL LOW (ref >=60)
GFR, Est Non African American: 41 mL/min/{1.73_m2} — ABNORMAL LOW (ref >=60)
Globulin: 2.7 g/dL (calc) (ref 1.9–3.7)
Glucose, Bld: 110 mg/dL — ABNORMAL HIGH (ref 65–99)
Potassium: 5.2 mmol/L (ref 3.5–5.3)
Sodium: 137 mmol/L (ref 135–146)
Total Bilirubin: 0.3 mg/dL (ref 0.2–1.2)
Total Protein: 6.8 g/dL (ref 6.1–8.1)

## 2019-09-05 LAB — CBC WITH DIFFERENTIAL/PLATELET
Absolute Monocytes: 582 cells/uL (ref 200–950)
Basophils Absolute: 82 cells/uL (ref 0–200)
Basophils Relative: 1 %
Eosinophils Absolute: 74 cells/uL (ref 15–500)
Eosinophils Relative: 0.9 %
HCT: 35.9 % (ref 35.0–45.0)
Hemoglobin: 11.5 g/dL — ABNORMAL LOW (ref 11.7–15.5)
Lymphs Abs: 1197 cells/uL (ref 850–3900)
MCH: 29.2 pg (ref 27.0–33.0)
MCHC: 32 g/dL (ref 32.0–36.0)
MCV: 91.1 fL (ref 80.0–100.0)
MPV: 11.3 fL (ref 7.5–12.5)
Monocytes Relative: 7.1 %
Neutro Abs: 6265 cells/uL (ref 1500–7800)
Neutrophils Relative %: 76.4 %
Platelets: 207 10*3/uL (ref 140–400)
RBC: 3.94 10*6/uL (ref 3.80–5.10)
RDW: 12.3 % (ref 11.0–15.0)
Total Lymphocyte: 14.6 %
WBC: 8.2 10*3/uL (ref 3.8–10.8)

## 2019-09-05 LAB — LIPID PANEL
Cholesterol: 143 mg/dL (ref <200)
HDL: 56 mg/dL (ref >=50)
LDL Cholesterol (Calc): 69 mg/dL (calc)
Non-HDL Cholesterol (Calc): 87 mg/dL (calc) (ref <130)
Total CHOL/HDL Ratio: 2.6 (calc) (ref <5.0)
Triglycerides: 98 mg/dL (ref <150)

## 2019-09-05 LAB — TSH: TSH: 3.78 mIU/L (ref 0.40–4.50)

## 2019-09-11 ENCOUNTER — Encounter: Payer: Self-pay | Admitting: Family Medicine

## 2019-09-16 ENCOUNTER — Other Ambulatory Visit: Payer: Self-pay | Admitting: Family Medicine

## 2019-10-06 ENCOUNTER — Other Ambulatory Visit: Payer: Self-pay

## 2019-10-08 MED ORDER — METOPROLOL TARTRATE 25 MG PO TABS: 25.0000 mg | ORAL_TABLET | Freq: Two times a day (BID) | ORAL | 1 refills | Status: DC

## 2020-01-26 ENCOUNTER — Other Ambulatory Visit: Payer: Self-pay | Admitting: Family Medicine

## 2020-03-04 ENCOUNTER — Ambulatory Visit (INDEPENDENT_AMBULATORY_CARE_PROVIDER_SITE_OTHER): Payer: Medicare Other | Admitting: Family Medicine

## 2020-03-04 ENCOUNTER — Encounter: Payer: Self-pay | Admitting: Family Medicine

## 2020-03-04 ENCOUNTER — Other Ambulatory Visit: Payer: Self-pay

## 2020-03-04 VITALS — BP 132/76 | HR 68 | Temp 97.8°F | Resp 14 | Ht 62.0 in | Wt 161.5 lb

## 2020-03-04 DIAGNOSIS — I10 Essential (primary) hypertension: Secondary | ICD-10-CM | POA: Diagnosis not present

## 2020-03-04 DIAGNOSIS — E782 Mixed hyperlipidemia: Secondary | ICD-10-CM

## 2020-03-04 DIAGNOSIS — I129 Hypertensive chronic kidney disease with stage 1 through stage 4 chronic kidney disease, or unspecified chronic kidney disease: Secondary | ICD-10-CM | POA: Diagnosis not present

## 2020-03-04 DIAGNOSIS — E039 Hypothyroidism, unspecified: Secondary | ICD-10-CM

## 2020-03-04 DIAGNOSIS — D631 Anemia in chronic kidney disease: Secondary | ICD-10-CM

## 2020-03-04 DIAGNOSIS — N1831 Chronic kidney disease, stage 3a: Secondary | ICD-10-CM

## 2020-03-04 DIAGNOSIS — N1832 Chronic kidney disease, stage 3b: Secondary | ICD-10-CM

## 2020-03-04 MED ORDER — AMLODIPINE BESYLATE 10 MG PO TABS: 10.0000 mg | ORAL_TABLET | Freq: Every day | ORAL | 3 refills | Status: DC

## 2020-03-04 MED ORDER — ROSUVASTATIN CALCIUM 20 MG PO TABS: ORAL_TABLET | ORAL | 3 refills | Status: DC

## 2020-03-04 MED ORDER — LEVOTHYROXINE SODIUM 100 MCG PO TABS: 100.0000 ug | ORAL_TABLET | Freq: Every day | ORAL | 3 refills | Status: DC

## 2020-03-04 MED ORDER — METOPROLOL TARTRATE 25 MG PO TABS: 25.0000 mg | ORAL_TABLET | Freq: Two times a day (BID) | ORAL | 3 refills | Status: DC

## 2020-03-04 NOTE — Progress Notes (Signed)
Name: Janice [name]   MRN: [id number]    DOB: 05/23/1941   Date:03/04/2020       Progress Note  Chief Complaint  Patient presents with  . Follow-up  . Hypertension  . Hyperlipidemia  . Hypothyroidism     Subjective:   Janice [name] is a 79 y.o. female, presents to clinic for routine follow up on the conditions listed above. Last OV was with me about 6 months ago, no significant concerns, med changes, or medical excitement since then.  Hypertension:  Currently managed on norvasc, losartan and metoprolol Meds, compliance, sx all stable, no change from last visit Pt reports good med compliance and denies any SE.  No lightheadedness, hypotension, syncope. Blood pressure today is well controlled, has been better controlled over the past 6 months, pt very happy about this BP Readings from Last 3 Encounters:  03/04/20 132/76  09/04/19 132/78  08/18/19 (!) 142/79  Pt denies CP, SOB, exertional sx, LE edema, palpitation, Ha's, visual disturbances Dietary efforts for BP?  Low salt She is due to f/up with nephrology soon and does not want to do labs today because they will do labs.  Hyperlipidemia: Current Medication Regimen:  crestor 20 mg, taking daily at bedtime, good compliance, no SE or concerns Last Lipids: Lab Results  Component Value Date   CHOL 143 09/04/2019   HDL 56 09/04/2019   LDLCALC 69 09/04/2019   TRIG 98 09/04/2019   CHOLHDL 2.6 09/04/2019  She states she's "going to have to go on a diet" she gained some weight from sitting around and eating over winter with COVID pandemic and local surge.   - Denies: Chest pain, shortness of breath, myalgias. - Documented aortic atherosclerosis? Yes - Risk factors for atherosclerosis: arteriosclerotic heart disease, hypercholesterolemia and hypertension Hx of PVD -she denies any change to LE - no swelling, pallor, numbness atrophy, weakness, claudication.  Overweight BMI, some weight gain - she feels related to diet and being  more sedentary, she is not concerned about her thyroid meds being a factor Weight and bmi reviewed with pt, up 13 lbs over the past 6-7 months Wt Readings from Last 5 Encounters:  03/04/20 161 lb 8 oz (73.3 kg)  09/04/19 153 lb 9.6 oz (69.7 kg)  08/18/19 148 lb (67.1 kg)  02/10/19 152 lb 8 oz (69.2 kg)  10/21/18 161 lb 3.2 oz (73.1 kg)   BMI Readings from Last 5 Encounters:  03/04/20 29.54 kg/m  09/04/19 28.09 kg/m  08/18/19 27.07 kg/m  02/10/19 27.89 kg/m  10/21/18 29.72 kg/m   Hypothyroidism: Current Medication Regimen:  levothyroxine dose 100 mcg Takes medicine  Current Symptoms: denies fatigue, heat/cold intolerance, bowel/skin changes or CVS symptoms Most recent results are below; we will not be repeating labs today. 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 Hypertensive renal disease 05/20/2018  . Anemia of chronic renal failure 05/20/2018  . Hyperparathyroidism, secondary renal ([place]) 05/20/2018  . Anemia 04/07/2018  . Microalbuminuria 10/09/2017  . CKD (chronic kidney disease) stage 3, GFR  30-59 ml/min 10/09/2017  . DNAR (do not attempt resuscitation) 10/08/2017  . Peripheral vascular disease (HCC) 02/01/2016  . LBBB (left bundle branch block) 01/23/2016  . Hypothyroidism 11/21/2015  . Medication monitoring encounter 11/21/2015  . Atherosclerosis of arteries 05/23/2015  . IFG (impaired fasting glucose) 05/23/2015  . Hypercholesteremia 05/23/2015  . Overweight (BMI 25.0-29.9) 05/23/2015    Past Surgical History:  Procedure Laterality Date  . artery bypass  rechanneling  2010  . CHOLECYSTECTOMY    . CLOSED REDUCTION NASAL FRACTURE N/A 10/10/2017   Procedure: CLOSED REDUCTION NASAL BONE FRACTURE;  Surgeon: [name], MD;  Location: [facility] ORS;  Service: ENT;  Laterality: N/A;  . HERNIA REPAIR  03/05/2016   15 x 22 cm retrorectus Atrium mesh repair.  . INSERTION OF MESH N/A 03/05/2016   Procedure: INSERTION OF MESH;  Surgeon: [name], MD;  Location: [facility] ORS;  Service: General;  Laterality: N/A;  . VENTRAL HERNIA REPAIR N/A 03/05/2016   Procedure: HERNIA REPAIR VENTRAL ADULT;  Surgeon: [name], MD;  Location: [facility] ORS;  Service: General;  Laterality: N/A;    Family History  Problem Relation Age of Onset  . Heart disease Mother   . Hypertension Mother   . Heart disease Father   . Hypertension Father   . Hypertension Sister   . Diabetes Sister   . Cancer Sister        lung  . COPD Neg Hx   . Stroke Neg Hx     Social History   Tobacco Use  . Smoking status: Former Smoker    Packs/day: 2.00    Years: 50.00    Pack years: 100.00    Types: Cigarettes    Quit date: 12/03/2008    Years since quitting: 11.2  . Smokeless tobacco: Never Used  . Tobacco comment: smoking cessation materials not required  Substance Use Topics  . Alcohol use: No    Alcohol/week: 0.0 standard drinks  . Drug use: No      Current Outpatient Medications:  .  amLODipine (NORVASC) 10 MG tablet, Take 1 tablet (10 mg total) by mouth daily., Disp: 90 tablet, Rfl: 3 .  aspirin EC 81 MG tablet, Take 81 mg daily by mouth., Disp: , Rfl:  .  Cholecalciferol (VITAMIN D3) 1000 UNITS CAPS, Take 1,000 Int'l Units by mouth daily., Disp: , Rfl:  .  levothyroxine (SYNTHROID) 100 MCG tablet, Take 1 tablet (100 mcg total) by mouth daily., Disp: 90 tablet, Rfl: 3 .  losartan (COZAAR) 100 MG tablet, TAKE 1 TABLET BY MOUTH EVERY DAY, Disp: 90 tablet, Rfl: 1 .  metoprolol tartrate (LOPRESSOR) 25 MG tablet, Take 1 tablet (25 mg total) by mouth 2 (two) times  daily., Disp: 180 tablet, Rfl: 1 .  rosuvastatin (CRESTOR) 20 MG tablet, TAKE 1 TABLET BY MOUTH EVERYDAY AT BEDTIME, Disp: 90 tablet, Rfl: 1  No Known Allergies  Chart Review Today: I personally reviewed active problem list, medication list, allergies, family history, social history, health maintenance, notes from last encounter, lab results, imaging with the patient/caregiver today.  Review of Systems  10 Systems reviewed and are negative for acute change except as noted in the HPI.  Objective:    Vitals:   03/04/20 0953  BP: 132/76  Pulse: 68  Resp: 14  Temp: 97.8 F (36.6 C)  SpO2: 100%  Weight: 161 lb 8 oz (73.3 kg)  Height: 5\' 2"  (1.575 m)    Body mass index is 29.54 kg/m.  Physical Exam  Vitals and nursing note reviewed.  Constitutional:      General: She is not in acute distress.    Appearance: Normal appearance. She is well-developed. She is not ill-appearing, toxic-appearing or diaphoretic.     Interventions: Face mask in place.     Comments: Overweight, well appearing elderly female, appears younger than age, NAD  HENT:     Head: Normocephalic and atraumatic.     Right Ear: External ear normal.     Left Ear: External ear normal.  Eyes:     General: Lids are normal. No scleral icterus.       Right eye: No discharge.        Left eye: No discharge.     Conjunctiva/sclera: Conjunctivae normal.  Neck:     Trachea: Phonation normal. No tracheal deviation.  Cardiovascular:     Rate and Rhythm: Normal rate and regular rhythm.     Pulses: Normal pulses.          Radial pulses are 2+ on the right side and 2+ on the left side.       Posterior tibial pulses are 2+ on the right side and 2+ on the left side.     Heart sounds: Normal heart sounds. No murmur. No friction rub. No gallop.      Comments: Mild non-pitting edema symmetrical to b/l le Pulmonary:     Effort: Pulmonary effort is normal. No respiratory distress.     Breath sounds: Normal breath sounds. No  stridor. No wheezing, rhonchi or rales.  Chest:     Chest wall: No tenderness.  Abdominal:     General: Bowel sounds are normal. There is no distension.     Palpations: Abdomen is soft.     Tenderness: There is no abdominal tenderness. There is no guarding or rebound.  Musculoskeletal:        General: No deformity. Normal range of motion.     Cervical back: Normal range of motion and neck supple.     Right lower leg: No edema.     Left lower leg: No edema.  Lymphadenopathy:     Cervical: No cervical adenopathy.  Skin:    General: Skin is warm and dry.     Coloration: Skin is not jaundiced or pale.     Findings: No rash.  Neurological:     Mental Status: She is alert. Mental status is at baseline.     Motor: No abnormal muscle tone.  Psychiatric:        Mood and Affect: Mood normal.        Speech: Speech normal.        Behavior: Behavior normal.       PHQ2/9: Depression screen [facility] 2/9 03/04/2020 09/04/2019 08/18/2019 02/10/2019 10/21/2018  Decreased Interest 0 0 0 0 0  Down, Depressed, Hopeless 0 0 0 0 0  PHQ - 2 Score 0 0 0 0 0  Altered sleeping 0 0 - 0 0  Tired, decreased energy 0 0 - 0 0  Change in appetite 0 0 - 0 0  Feeling bad or failure about yourself  0 0 - 0 0  Trouble concentrating 0 0 - 0 0  Moving slowly or fidgety/restless 0 0 - 0 0  Suicidal thoughts 0 0 - 0 0  PHQ-9 Score 0 0 - 0 0  Difficult doing work/chores Not difficult at all Not difficult at all - Not difficult at all Not difficult at all    phq 9  is neg, reviewed  Fall Risk: Fall Risk  03/04/2020 09/04/2019 08/18/2019 02/10/2019 10/21/2018  Falls in the past year? 0 0 0 0 0  Number falls in past yr: 0 0 0 - 0  Injury with Fall? 0 0 0 - -  Risk for fall due to : - - - - -  Risk for fall due to: Comment - - - - -  Follow up - - Falls prevention discussed - -    Functional Status Survey: Is the patient deaf or have difficulty hearing?: No Does the patient have difficulty seeing, even when wearing  glasses/contacts?: No Does the patient have difficulty concentrating, remembering, or making decisions?: No Does the patient have difficulty walking or climbing stairs?: No Does the patient have difficulty dressing or bathing?: No Does the patient have difficulty doing errands alone such as visiting a doctor's office or shopping?: No   Assessment & Plan:   1. Mixed hyperlipidemia Patient is compliant with medications, no side effects or concerns she denies myalgias, exertional chest pain, claudication.  Reviewed last lipid panel which was well controlled, patient prefers not to do labs today she is doing very soon with nephrology.  Do feel that her hyperlipidemia is well controlled enough that she does not need to do today and we will do at her next routine office visit - rosuvastatin (CRESTOR) 20 MG tablet; TAKE 1 TABLET BY MOUTH EVERYDAY AT BEDTIME  Dispense: 90 tablet; Refill: 3  2. Hypertensive renal disease Blood pressure well controlled and at goal, stable, no side effects or concerns. She does asked that I will send in her Norvasc along with her other blood pressure medications.  She is following up with nephrology We did review her renal function is stable chronic kidney disease stage IIIb - amLODipine (NORVASC) 10 MG tablet; Take 1 tablet (10 mg total) by mouth daily.  Dispense: 90 tablet; Refill: 3  3. Hypothyroidism, unspecified type No symptoms of chemical hypothyroid she does not want to do labs today, however she has gained some weight.  Encouraged her to follow-up in clinic sooner than her next routine appointment if she is working on diet and exercise and continues to gain weight we would want to check her TSH and make sure her meds do not need to be adjusted.  However today she does have a long history of same dosing and normal TSH - levothyroxine (SYNTHROID) 100 MCG tablet; Take 1 tablet (100 mcg total) by mouth daily.  Dispense: 90 tablet; Refill: 3  4. Essential  hypertension, benign Stable, well controlled - metoprolol tartrate (LOPRESSOR) 25 MG tablet; Take 1 tablet (25 mg total) by mouth 2 (two) times daily.  Dispense: 180 tablet; Refill: 3 - amLODipine (NORVASC) 10 MG tablet; Take 1 tablet (10 mg total) by mouth daily.  Dispense: 90 tablet; Refill: 3   Return for 6 month routine f/up and MWV if she will do it .   Danelle Berry, PA-C 03/04/20 9:58 AM

## 2020-03-04 NOTE — Patient Instructions (Signed)
Come in sooner if you are gaining weight and want to recheck your thyroid labs.

## 2020-06-28 ENCOUNTER — Other Ambulatory Visit: Payer: Self-pay | Admitting: Nephrology

## 2020-06-28 DIAGNOSIS — I129 Hypertensive chronic kidney disease with stage 1 through stage 4 chronic kidney disease, or unspecified chronic kidney disease: Secondary | ICD-10-CM

## 2020-06-28 DIAGNOSIS — N1831 Chronic kidney disease, stage 3a: Secondary | ICD-10-CM

## 2020-07-08 ENCOUNTER — Ambulatory Visit
Admission: RE | Admit: 2020-07-08 | Discharge: 2020-07-08 | Disposition: A | Discharge status: Home / Self Care | Payer: Medicare Other | Source: Ambulatory Visit | Attending: Nephrology | Admitting: Nephrology

## 2020-07-08 ENCOUNTER — Other Ambulatory Visit: Payer: Self-pay

## 2020-07-08 DIAGNOSIS — I129 Hypertensive chronic kidney disease with stage 1 through stage 4 chronic kidney disease, or unspecified chronic kidney disease: Secondary | ICD-10-CM | POA: Diagnosis present

## 2020-07-08 DIAGNOSIS — N1831 Chronic kidney disease, stage 3a: Secondary | ICD-10-CM

## 2020-07-16 ENCOUNTER — Other Ambulatory Visit: Payer: Self-pay | Admitting: Family Medicine

## 2020-07-29 ENCOUNTER — Ambulatory Visit: Payer: Medicare Other | Admitting: Podiatry

## 2020-08-12 ENCOUNTER — Ambulatory Visit: Payer: Medicare Other | Admitting: Podiatry

## 2020-08-18 ENCOUNTER — Ambulatory Visit: Payer: Medicare Other

## 2020-08-19 ENCOUNTER — Encounter: Payer: Self-pay | Admitting: Podiatry

## 2020-08-19 ENCOUNTER — Ambulatory Visit (INDEPENDENT_AMBULATORY_CARE_PROVIDER_SITE_OTHER): Payer: Medicare Other | Admitting: Podiatry

## 2020-08-19 ENCOUNTER — Other Ambulatory Visit: Payer: Self-pay

## 2020-08-19 DIAGNOSIS — B351 Tinea unguium: Secondary | ICD-10-CM | POA: Diagnosis not present

## 2020-08-19 DIAGNOSIS — M79674 Pain in right toe(s): Secondary | ICD-10-CM | POA: Diagnosis not present

## 2020-08-19 DIAGNOSIS — L603 Nail dystrophy: Secondary | ICD-10-CM

## 2020-08-19 DIAGNOSIS — M79675 Pain in left toe(s): Secondary | ICD-10-CM

## 2020-08-19 NOTE — Progress Notes (Signed)
   SUBJECTIVE Patient presents to office today complaining of elongated, thickened nails that cause pain while ambulating in shoes.  She is unable to trim her own nails. Patient is here for further evaluation and treatment.  Past Medical History:  Diagnosis Date  . Chronic kidney disease   . Hernia, ventral   . Hyperlipidemia   . Hypertension   . Subclavian arterial stenosis (HCC)   . Thyroid disease     OBJECTIVE General Patient is awake, alert, and oriented x 3 and in no acute distress. Derm Skin is dry and supple bilateral. Negative open lesions or macerations. Remaining integument unremarkable. Nails are tender, long, thickened and dystrophic with subungual debris, consistent with onychomycosis, 1-5 bilateral. No signs of infection noted.  The left hallux nail plate is the worst.  It is very thick with cracking and is very sensitive and painful. Vasc  DP and PT pedal pulses palpable bilaterally. Temperature gradient within normal limits.  Neuro Epicritic and protective threshold sensation grossly intact bilaterally.  Musculoskeletal Exam No symptomatic pedal deformities noted bilateral. Muscular strength within normal limits.  ASSESSMENT 1.  Pain due to onychomycosis of toenails bilateral 2.  Dystrophic nail left hallux nail plate 3. Pain in foot bilateral  PLAN OF CARE 1. Patient evaluated today.  2. Instructed to maintain good pedal hygiene and foot care.  3. Mechanical debridement of nails 1-5 bilaterally performed using a nail nipper. Filed with dremel without incident.  4.  Decision was made today during the visit to completely remove the left hallux nail plate.  This would be in the best interest of the patient to alleviate pain and let a new nail grow when that is potentially more healthy and viable than the present one.  Patient agrees and would like to proceed with total temporary nail avulsion of the hallux nail plate  5.  3 mL of 2% lidocaine plain was infiltrated in  digital block fashion around the hallux.  Nail was avulsed completely and dry sterile dressing was applied.  Instructions for aftercare were provided verbally as well.  Leave dressings on for the day and tonight she may remove the dressings and begin to apply antibiotic ointment and a Band-Aid.  Recommend Epsom salt soaks.   6.  Postsurgical shoe was dispensed today.  Weightbearing as tolerated  7.  Return to clinic in 3 mos.    Felecia Shelling, DPM [facility]  Dr. Felecia Shelling, DPM    [address]                                        [place], [place] [address]                Office [phone]  Fax [phone]

## 2020-09-01 NOTE — Progress Notes (Signed)
Name: Janice [name]   MRN: [id number]    DOB: Nov 26, 1941   Date:09/02/2020       Progress Note  Chief Complaint  Patient presents with  . Hyperlipidemia  . Hypertension  . Hypothyroidism     Subjective:   Janice [name] is a 79 y.o. female, presents to clinic for   Hyperlipidemia: Currently treated with Crestor 20 mg qd, pt reports good med compliance Last Lipids: Lab Results  Component Value Date   CHOL 143 09/04/2019   HDL 56 09/04/2019   LDLCALC 69 09/04/2019   TRIG 98 09/04/2019   CHOLHDL 2.6 09/04/2019   - Denies: Chest pain, shortness of breath, myalgias, claudication  Hypertension:  Currently managed on Amlodipine 10mg  qd, Losartan 100mg  qd and metoprolol 25mg  1 tab qd Pt reports good med compliance and denies any SE.   Blood pressure today is well controlled. BP Readings from Last 3 Encounters:  09/02/20 126/80  03/04/20 132/76  09/04/19 132/78   Pt denies CP, SOB, exertional sx, LE edema, palpitation, Ha's, visual disturbances, lightheadedness, hypotension, syncope. Dietary efforts for BP?  She is worked on her diet cut out a lot of fatty foods increased fruits and vegetables low-salt avoid NSAIDs and is following carefully with nephrology and very keenly aware of her kidney function and protecting her kidney function.  sees Dr. 11/02/20 nephrology Last office visit June 17, 2020 notes and lab results were reviewed today with the patient Patient Active Problem List  Diagnosis  . Chronic kidney disease, stage 3a  . Proteinuria  . Secondary hyperparathyroidism of renal origin (HCC)  . 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, or unspecified   Labs from nephrology were last done June 10, 2020 parathyroid hormone was elevated at 86, hemoglobin 11.6,  06/10/2020 serum creatinine 1.29  GFR 39  11/24/2019 Serum creatinine 1.18  GFR 44 Lab Results  Component Value Date   CREATININE 1.24 (H) 09/04/2019    Lab Results  Component Value Date   BUN 20 09/04/2019   Lab Results  Component Value Date   GFRNONAA 41 (L) 09/04/2019   I scrolled through her chemistries over the past several years GFR his ranged 41-51 for the past 5 years, labs prior to that 2010 GFR was greater than 60   Hypothyroid on synthroid 100 mcg Patient is compliant with her medications takes it daily in the morning on empty stomach, she denies any weight changes, hair or skin changes denies any constipation or diarrhea, peripheral edema, change in energy or mood Lab Results  Component Value Date   TSH 3.78 09/04/2019   Patient has not gotten vaccinated for Covid she is concerned of vaccine side effects with her current diagnoses, she states that her sister had a stroke and died shortly after having a vaccination and another close friend became very ill.       Current Outpatient Medications:  .  amLODipine (NORVASC) 10 MG tablet, Take 1 tablet (10 mg total) by mouth daily., Disp: 90 tablet, Rfl: 3 .  aspirin EC 81 MG tablet, Take 81 mg daily by mouth., Disp: , Rfl:  .  Cholecalciferol (VITAMIN D3) 1000 UNITS CAPS, Take 1,000 Int'l Units by mouth daily., Disp: , Rfl:  .  levothyroxine (SYNTHROID) 100 MCG tablet, Take 1 tablet (100 mcg total) by mouth daily., Disp: 90 tablet, Rfl: 3 .  losartan (COZAAR) 100 MG tablet, TAKE 1 TABLET BY MOUTH EVERY DAY, Disp: 90 tablet,  Rfl: 3 .  metoprolol tartrate (LOPRESSOR) 25 MG tablet, Take 1 tablet (25 mg total) by mouth 2 (two) times daily., Disp: 180 tablet, Rfl: 3 .  rosuvastatin (CRESTOR) 20 MG tablet, TAKE 1 TABLET BY MOUTH EVERYDAY AT BEDTIME, Disp: 90 tablet, Rfl: 3  Patient Active Problem List   Diagnosis Date Noted  . Hypertensive renal disease 05/20/2018  . Anemia of chronic renal failure 05/20/2018  . 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 Anemia 04/07/2018  . Microalbuminuria 10/09/2017  . CKD (chronic kidney disease) stage 3, GFR 30-59 ml/min  (HCC) 10/09/2017  . DNAR (do not attempt resuscitation) 10/08/2017  . Peripheral vascular disease (HCC) 02/01/2016  . LBBB (left bundle branch block) 01/23/2016  . Hypothyroidism 11/21/2015  . Medication monitoring encounter 11/21/2015  . Atherosclerosis of arteries 05/23/2015  . IFG (impaired fasting glucose) 05/23/2015  . Hypercholesteremia 05/23/2015  . Overweight (BMI 25.0-29.9) 05/23/2015    Past Surgical History:  Procedure Laterality Date  . artery bypass rechanneling  2010  . CHOLECYSTECTOMY    . CLOSED REDUCTION NASAL FRACTURE N/A 10/10/2017   Procedure: CLOSED REDUCTION NASAL BONE FRACTURE;  Surgeon: [name], MD;  Location: [facility] ORS;  Service: ENT;  Laterality: N/A;  . HERNIA REPAIR  03/05/2016   15 x 22 cm retrorectus [facility] mesh repair.  . INSERTION OF MESH N/A 03/05/2016   Procedure: INSERTION OF MESH;  Surgeon: [name], MD;  Location: [facility] ORS;  Service: General;  Laterality: N/A;  . VENTRAL HERNIA REPAIR N/A 03/05/2016   Procedure: HERNIA REPAIR VENTRAL ADULT;  Surgeon: [name], MD;  Location: [facility] ORS;  Service: General;  Laterality: N/A;    Family History  Problem Relation Age of Onset  . Heart disease Mother   . Hypertension Mother   . Heart disease Father   . Hypertension Father   . Hypertension Sister   . Diabetes Sister   . Cancer Sister        lung  . COPD Neg Hx   . Stroke Neg Hx     Social History   Tobacco Use  . Smoking status: Former Smoker    Packs/day: 2.00    Years: 50.00    Pack years: 100.00    Types: Cigarettes    Quit date: 12/03/2008    Years since quitting: 11.7  . Smokeless tobacco: Never Used  . Tobacco comment: smoking cessation materials not required  Vaping Use  . Vaping Use: Never used  Substance Use Topics  . Alcohol use: No    Alcohol/week: 0.0 standard drinks  . Drug use: No     No Known Allergies  Health Maintenance  Topic Date Due  . DEXA SCAN  09/03/2020 (Originally 09/18/2004)  .  COVID-19 Vaccine (1) 09/18/2020 (Originally 09/19/1953)  . TETANUS/TDAP  03/04/2021 (Originally 12/03/2018)  . INFLUENZA VACCINE  Completed  . PNA vac Low Risk Adult  Completed    Chart Review Today: I have reviewed the patient's medical history in detail and updated the computerized patient record.   Review of Systems  10 Systems reviewed and are negative for acute change except as noted in the HPI. Objective:   Vitals:   09/02/20 1031  BP: 126/80  Pulse: 71  Resp: 14  Temp: 98 F (36.7 C)  TempSrc: Oral  SpO2: 96%  Weight: 159 lb 4.8 oz (72.3 kg)  Height: 5\' 2"  (1.575 m)    Body mass index is 29.14 kg/m.  Physical Exam Vitals and nursing note reviewed.  Constitutional:      General: She is not in acute distress.    Appearance: Normal appearance. She is overweight. She is not ill-appearing, toxic-appearing or diaphoretic.     Interventions: Face mask in place.  Eyes:     General: No scleral icterus.       Right eye: No discharge.        Left eye: No discharge.  Cardiovascular:     Rate and Rhythm: Normal rate and regular rhythm.  No extrasystoles are present.    Pulses: Normal pulses.          Radial pulses are 2+ on the right side and 2+ on the left side.     Heart sounds: Heart sounds not distant. No murmur heard.  No gallop.   Pulmonary:     Effort: Pulmonary effort is normal.     Breath sounds: Normal breath sounds and air entry. No decreased breath sounds, wheezing, rhonchi or rales.  Abdominal:     General: Bowel sounds are normal.     Palpations: Abdomen is soft.  Musculoskeletal:     Right lower leg: No edema.     Left lower leg: No edema.  Neurological:     Mental Status: She is alert.  Psychiatric:        Mood and Affect: Mood normal.        Behavior: Behavior normal. Behavior is cooperative.         Assessment & Plan:   1. 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 Hypothyroidism, unspecified type Compliant with medications no concerns today for chemical hyper or hypothyroid last labs were 1 year ago will check TSH today and annually for screening unless patient has any new symptoms - TSH  3. Mixed hyperlipidemia Patient is compliant with her statin medication without any myalgias fatigue or side effects or concerns.  Her dietary efforts for her kidney function and blood pressure has limited some red meats and increased vegetables and fruits this will also be helpful for improving cholesterol will recheck lipid panel today all her other labs have been done by nephrology were reviewed today - Lipid panel  4. Essential hypertension, benign Stable well-controlled blood pressure  5. Anemia due to stage 3a chronic kidney disease (HCC) Stable anemia reviewed labs per nephrology  6. Need for influenza vaccination - Flu Vaccine QUAD High Dose(Fluad)  7. Proteinuria, unspecified type Per nephrology  8. Hyperparathyroidism, secondary renal [facility]) Per nephrology labs and office visit reviewed   9. Vaccine counseling Discussed Covid vaccination with the patient today, she has many concerns of side effects we discussed very rare side effects, I offered to provide her with information or answer whatever questions I could.  I did recommend her getting Covid vaccinations either [employer] and I did explain that her risk of serious illness or death if she gets Covid is much higher than her risk of rare vaccine side effects if she receives the Covid vaccinations.  Patient is still at this time not interested in getting vaccinated she is being very careful to stay away from public areas she is really only around some of her  family members were all vaccinated.    Return in about  6 months (around 03/03/2021) for routine f/up .   Danelle BerryLeisa Brigid Vandekamp, PA-C 09/02/20 10:37 AM

## 2020-09-02 ENCOUNTER — Ambulatory Visit (INDEPENDENT_AMBULATORY_CARE_PROVIDER_SITE_OTHER): Payer: Medicare Other | Admitting: Family Medicine

## 2020-09-02 ENCOUNTER — Other Ambulatory Visit: Payer: Self-pay

## 2020-09-02 ENCOUNTER — Encounter: Payer: Self-pay | Admitting: Family Medicine

## 2020-09-02 VITALS — BP 126/80 | HR 71 | Temp 98.0°F | Resp 14 | Ht 62.0 in | Wt 159.3 lb

## 2020-09-02 DIAGNOSIS — E039 Hypothyroidism, unspecified: Secondary | ICD-10-CM | POA: Diagnosis not present

## 2020-09-02 DIAGNOSIS — N1831 Chronic kidney disease, stage 3a: Secondary | ICD-10-CM

## 2020-09-02 DIAGNOSIS — Z23 Encounter for immunization: Secondary | ICD-10-CM | POA: Diagnosis not present

## 2020-09-02 DIAGNOSIS — I10 Essential (primary) hypertension: Secondary | ICD-10-CM | POA: Diagnosis not present

## 2020-09-02 DIAGNOSIS — N2581 Secondary hyperparathyroidism of renal origin: Secondary | ICD-10-CM

## 2020-09-02 DIAGNOSIS — R809 Proteinuria, unspecified: Secondary | ICD-10-CM

## 2020-09-02 DIAGNOSIS — I129 Hypertensive chronic kidney disease with stage 1 through stage 4 chronic kidney disease, or unspecified chronic kidney disease: Secondary | ICD-10-CM

## 2020-09-02 DIAGNOSIS — E782 Mixed hyperlipidemia: Secondary | ICD-10-CM | POA: Diagnosis not present

## 2020-09-02 DIAGNOSIS — Z7185 Encounter for immunization safety counseling: Secondary | ICD-10-CM

## 2020-09-02 DIAGNOSIS — D631 Anemia in chronic kidney disease: Secondary | ICD-10-CM

## 2020-09-03 LAB — LIPID PANEL
Cholesterol: 134 mg/dL (ref <200)
HDL: 54 mg/dL (ref >=50)
LDL Cholesterol (Calc): 61 mg/dL (calc)
Non-HDL Cholesterol (Calc): 80 mg/dL (calc) (ref <130)
Total CHOL/HDL Ratio: 2.5 (calc) (ref <5.0)
Triglycerides: 102 mg/dL (ref <150)

## 2020-09-03 LAB — TSH: TSH: 1.75 mIU/L (ref 0.40–4.50)

## 2020-10-04 ENCOUNTER — Ambulatory Visit (INDEPENDENT_AMBULATORY_CARE_PROVIDER_SITE_OTHER): Payer: Medicare Other

## 2020-10-04 DIAGNOSIS — Z0000 Encounter for general adult medical examination without abnormal findings: Secondary | ICD-10-CM | POA: Diagnosis not present

## 2020-10-04 NOTE — Progress Notes (Signed)
Subjective:   Janice [name] is a 79 y.o. female who presents for Medicare Annual (Subsequent) preventive examination.  Virtual Visit via Telephone Note  I connected with  [name] on 10/04/20 at  3:30 PM EDT by telephone and verified that I am speaking with the correct person using two identifiers.  Medicare Annual Wellness visit completed telephonically due to Covid-19 pandemic.   Location: Patient: home Provider: [facility]   I discussed the limitations, risks, security and privacy concerns of performing an evaluation and management service by telephone and the availability of in person appointments. The patient expressed understanding and agreed to proceed.  Unable to perform video visit due to video visit attempted and failed and/or patient does not have video capability.   Some vital signs may be absent or patient reported.   [name], LPN    Review of Systems     Cardiac Risk Factors include: advanced age (>22men, >85 women);hypertension;dyslipidemia     Objective:    There were no vitals filed for this visit. There is no height or weight on file to calculate BMI.  Advanced Directives 10/04/2020 08/18/2019 08/15/2018 10/10/2017 09/28/2017 05/09/2017 11/21/2016  Does Patient Have a Medical Advance Directive? Yes No Yes No No No No  Type of Advance Directive Out of facility DNR (pink MOST or yellow form) - Midwife;Living will - - - -  Copy of Healthcare Power of Attorney in Chart? - - No - copy requested - - - -  Would patient like information on creating a medical advance directive? - Yes (MAU/Ambulatory/Procedural Areas - Information given) - No - Patient declined No - Patient declined - -    Current Medications (verified) Outpatient Encounter Medications as of 10/04/2020  Medication Sig  . amLODipine (NORVASC) 10 MG tablet Take 1 tablet (10 mg total) by mouth daily.  Marland Kitchen aspirin EC 81 MG tablet Take 81 mg daily by mouth.  . Cholecalciferol  (VITAMIN D3) 1000 UNITS CAPS Take 1,000 Int'l Units by mouth daily.  Marland Kitchen FARXIGA 10 MG TABS tablet Take 1 tablet by mouth daily.  Marland Kitchen levothyroxine (SYNTHROID) 100 MCG tablet Take 1 tablet (100 mcg total) by mouth daily.  Marland Kitchen losartan (COZAAR) 100 MG tablet TAKE 1 TABLET BY MOUTH EVERY DAY  . metoprolol tartrate (LOPRESSOR) 25 MG tablet Take 1 tablet (25 mg total) by mouth 2 (two) times daily.  . rosuvastatin (CRESTOR) 20 MG tablet TAKE 1 TABLET BY MOUTH EVERYDAY AT BEDTIME   No facility-administered encounter medications on file as of 10/04/2020.    Allergies (verified) Patient has no known allergies.   History: Past Medical History:  Diagnosis Date  . Chronic kidney disease   . Hernia, ventral   . Hyperlipidemia   . Hypertension   . Subclavian arterial stenosis (HCC)   . Thyroid disease    Past Surgical History:  Procedure Laterality Date  . artery bypass rechanneling  2010  . CHOLECYSTECTOMY    . CLOSED REDUCTION NASAL FRACTURE N/A 10/10/2017   Procedure: CLOSED REDUCTION NASAL BONE FRACTURE;  Surgeon: [name], MD;  Location: [facility] OR[facility];  Service: ENT;  Laterality: N/A;  . HERNIA REPAIR  03/05/2016   15 x 22 cm retrorectus [facility] mesh repair.  . INSERTION OF MESH N/A 03/05/2016   Procedure: INSERTION OF MESH;  Surgeon: [name], MD;  Location: [facility] OR[facility];  Service: General;  Laterality: N/A;  . VENTRAL HERNIA REPAIR N/A 03/05/2016   Procedure: HERNIA REPAIR VENTRAL ADULT;  Surgeon:  [name], MD;  Location: [facility] OR[facility];  Service: General;  Laterality: N/A;   Family History  Problem Relation Age of Onset  . Heart disease Mother   . Hypertension Mother   . Heart disease Father   . Hypertension Father   . Hypertension Sister   . Diabetes Sister   . Cancer Sister        lung  . COPD Neg Hx   . Stroke Neg Hx    Social History   Socioeconomic History  . Marital status: Widowed    Spouse name: Not on file  . Number of children: 4  . Years of education: some  college  . Highest education level: 12th grade  Occupational History  . Occupation: Retired  Tobacco Use  . Smoking status: Former Smoker    Packs/day: 2.00    Years: 50.00    Pack years: 100.00    Types: Cigarettes    Quit date: 12/03/2008    Years since quitting: 11.8  . Smokeless tobacco: Never Used  . Tobacco comment: smoking cessation materials not required  Vaping Use  . Vaping Use: Never used  Substance and Sexual Activity  . Alcohol use: No    Alcohol/week: 0.0 standard drinks  . Drug use: No  . Sexual activity: Not Currently  Other Topics Concern  . Not on file  Social History Narrative  . Not on file   Social Determinants of Health   Financial Resource Strain: Low Risk   . Difficulty of Paying Living Expenses: Not hard at all  Food Insecurity: No Food Insecurity  . Worried About Programme researcher, broadcasting/film/video in the Last Year: Never true  . Ran Out of Food in the Last Year: Never true  Transportation Needs: No Transportation Needs  . Lack of Transportation (Medical): No  . Lack of Transportation (Non-Medical): No  Physical Activity: Insufficiently Active  . Days of Exercise per Week: 7 days  . Minutes of Exercise per Session: 20 min  Stress: No Stress Concern Present  . Feeling of Stress : Not at all  Social Connections: Socially Isolated  . Frequency of Communication with Friends and Family: More than three times a week  . Frequency of Social Gatherings with Friends and Family: More than three times a week  . Attends Religious Services: Never  . Active Member of Clubs or Organizations: No  . Attends Banker Meetings: Never  . Marital Status: Widowed    Tobacco Counseling Counseling given: Not Answered Comment: smoking cessation materials not required   Clinical Intake:  Pre-visit preparation completed: Yes  Pain : No/denies pain     Nutritional Risks: None Diabetes: No  How often do you need to have someone help you when you read  instructions, pamphlets, or other written materials from your doctor or pharmacy?: 1 - Never    Interpreter Needed?: No  Information entered by :: [name] LPN   Activities of Daily Living In your present state of health, do you have any difficulty performing the following activities: 10/04/2020 09/02/2020  Hearing? N N  Comment declines hearing aids -  Vision? N N  Difficulty concentrating or making decisions? N N  Walking or climbing stairs? N N  Dressing or bathing? N N  Doing errands, shopping? N N  Preparing Food and eating ? N -  Using the Toilet? N -  In the past six months, have you accidently leaked urine? N -  Do you have problems with loss  of bowel control? N -  Managing your Medications? N -  Managing your Finances? N -  Housekeeping or managing your Housekeeping? N -  Some recent data might be hidden    Patient Care Team: Danelle Berryapia, Leisa, PA-C as PCP - General (Family Medicine) Lada, Janit BernMelinda P, MD as PCP - Family Medicine (Family Medicine) Lemar LivingsByrnett, Merrily PewJeffrey W, MD as Consulting Physician (General Surgery) Lamont DowdyKolluru, Sarath, MD as Consulting Physician (Internal Medicine)  Indicate any recent Medical Services you may have received from other than [facility] providers in the past year (date may be approximate).     Assessment:   This is a routine wellness examination for ArgyleLinda.  Hearing/Vision screen  Hearing Screening   125Hz  250Hz  500Hz  1000Hz  2000Hz  3000Hz  4000Hz  6000Hz  8000Hz   Right ear:           Left ear:           Comments: Pt denies hearing difficulty  Vision Screening Comments: Past due for eye exam, not established with provider, declined referral   Dietary issues and exercise activities discussed: Current Exercise Habits: Home exercise routine, Type of exercise: walking, Time (Minutes): 15, Frequency (Times/Week): 7, Weekly Exercise (Minutes/Week): 105, Intensity: Mild, Exercise limited by: None identified  Goals    . diet     Recommend eating 3 small  healthy meals and at least 2 healthy snacks per day      Depression Screen PHQ 2/9 Scores 10/04/2020 09/02/2020 03/04/2020 09/04/2019 08/18/2019 02/10/2019 10/21/2018  PHQ - 2 Score 0 0 0 0 0 0 0  PHQ- 9 Score - - 0 0 - 0 0    Fall Risk Fall Risk  10/04/2020 09/02/2020 03/04/2020 09/04/2019 08/18/2019  Falls in the past year? 0 0 0 0 0  Number falls in past yr: 0 0 0 0 0  Injury with Fall? 0 0 0 0 0  Risk for fall due to : No Fall Risks - - - -  Risk for fall due to: Comment - - - - -  Follow up Falls prevention discussed Falls evaluation completed - - Falls prevention discussed    Any stairs in or around the home? No  If so, are there any without handrails? No  Home free of loose throw rugs in walkways, pet beds, electrical cords, etc? Yes  Adequate lighting in your home to reduce risk of falls? Yes   ASSISTIVE DEVICES UTILIZED TO PREVENT FALLS:  Life alert? No  Use of a cane, walker or w/c? No  Grab bars in the bathroom? No  Shower chair or bench in shower? No  Elevated toilet seat or a handicapped toilet? No   TIMED UP AND GO:  Was the test performed? No . Telephonic visit.   Cognitive Function:     6CIT Screen 10/04/2020 08/18/2019 08/15/2018  What Year? 0 points 0 points 0 points  What month? 0 points 0 points 0 points  What time? 0 points 0 points 3 points  Count back from 20 0 points 0 points 0 points  Months in reverse 0 points 0 points 0 points  Repeat phrase 0 points 0 points 4 points  Total Score 0 0 7    Immunizations Immunization History  Administered Date(s) Administered  . Fluad Quad(high Dose 65+) 09/04/2019, 09/02/2020  . Influenza, High Dose Seasonal PF 10/08/2017, 08/15/2018  . Influenza,inj,Quad PF,6+ Mos 10/17/2016  . Influenza-Unspecified 11/15/2014  . Pneumococcal Conjugate-13 11/15/2014  . Pneumococcal Polysaccharide-23 09/04/2019  . Td 12/03/2008    TDAP status: Due,  Education has been provided regarding the importance of this vaccine. Advised may  receive this vaccine at local pharmacy or Health Dept. Aware to provide a copy of the vaccination record if obtained from local pharmacy or Health Dept. Verbalized acceptance and understanding.   Flu Vaccine status: Up to date   Pneumococcal vaccine status: Up to date   Covid-19 vaccine status: Declined, Education has been provided regarding the importance of this vaccine but patient still declined. Advised may receive this vaccine at local pharmacy or Health Dept.or vaccine clinic. Aware to provide a copy of the vaccination record if obtained from local pharmacy or Health Dept. Verbalized acceptance and understanding.  Qualifies for Shingles Vaccine? Yes   Zostavax completed No   Shingrix Completed?: No.    Education has been provided regarding the importance of this vaccine. Patient has been advised to call insurance company to determine out of pocket expense if they have not yet received this vaccine. Advised may also receive vaccine at local pharmacy or Health Dept. Verbalized acceptance and understanding.  Screening Tests Health Maintenance  Topic Date Due  . COVID-19 Vaccine (1) Never done  . DEXA SCAN  Never done  . TETANUS/TDAP  03/04/2021 (Originally 12/03/2018)  . INFLUENZA VACCINE  Completed  . PNA vac Low Risk Adult  Completed    Health Maintenance  Health Maintenance Due  Topic Date Due  . COVID-19 Vaccine (1) Never done  . DEXA SCAN  Never done    Colorectal cancer screening: No longer required.    Mammogram status: No longer required.    Bone density screening: declined  Lung Cancer Screening: (Low Dose CT Chest recommended if Age 98-80 years, 30 pack-year currently smoking OR have quit w/in 15years.) does not qualify.   Additional Screening:  Hepatitis C Screening: does not qualify;  Vision Screening: Recommended annual ophthalmology exams for early detection of glaucoma and other disorders of the eye. Is the patient up to date with their annual eye exam?  No    Who is the provider or what is the name of the office in which the patient attends annual eye exams? Not established If pt is not established with a provider, would they like to be referred to a provider to establish care? No . Pt declined  Dental Screening: Recommended annual dental exams for proper oral hygiene  Community Resource Referral / Chronic Care Management: CRR required this visit?  No   CCM required this visit?  No      Plan:     I have personally reviewed and noted the following in the patient's chart:   . Medical and social history . Use of alcohol, tobacco or illicit drugs  . Current medications and supplements . Functional ability and status . Nutritional status . Physical activity . Advanced directives . List of other physicians . Hospitalizations, surgeries, and ER visits in previous 12 months . Vitals . Screenings to include cognitive, depression, and falls . Referrals and appointments  In addition, I have reviewed and discussed with patient certain preventive protocols, quality metrics, and best practice recommendations. A written personalized care plan for preventive services as well as general preventive health recommendations were provided to patient.     [name], LPN   46/04/[id number]   Nurse Notes: none

## 2020-10-04 NOTE — Patient Instructions (Signed)
Janice [name] , Thank you for taking time to come for your Medicare Wellness Visit. I appreciate your ongoing commitment to your health goals. Please review the following plan we discussed and let me know if I can assist you in the future.   Screening recommendations/referrals: Colonoscopy: no longer required  Mammogram: no longer required Bone Density: declined Recommended yearly ophthalmology/optometry visit for glaucoma screening and checkup Recommended yearly dental visit for hygiene and checkup  Vaccinations: Influenza vaccine: done 09/02/20 Pneumococcal vaccine: done 09/04/19 Tdap vaccine: due Shingles vaccine: Shingrix discussed. Please contact your pharmacy for coverage information.  Covid-19:declined   Conditions/risks identified: Keep up the great work!  Next appointment: Follow up in one year for your annual wellness visit    Preventive Care 81 Years and Older, Female Preventive care refers to lifestyle choices and visits with your health care provider that can promote health and wellness. What does preventive care include?  A yearly physical exam. This is also called an annual well check.  Dental exams once or twice a year.  Routine eye exams. Ask your health care provider how often you should have your eyes checked.  Personal lifestyle choices, including:  Daily care of your teeth and gums.  Regular physical activity.  Eating a healthy diet.  Avoiding tobacco and drug use.  Limiting alcohol use.  Practicing safe sex.  Taking low-dose aspirin every day.  Taking vitamin and mineral supplements as recommended by your health care provider. What happens during an annual well check? The services and screenings done by your health care provider during your annual well check will depend on your age, overall health, lifestyle risk factors, and family history of disease. Counseling  Your health care provider may ask you questions about your:  Alcohol use.  Tobacco  use.  Drug use.  Emotional well-being.  Home and relationship well-being.  Sexual activity.  Eating habits.  History of falls.  Memory and ability to understand (cognition).  Work and work Astronomer.  Reproductive health. Screening  You may have the following tests or measurements:  Height, weight, and BMI.  Blood pressure.  Lipid and cholesterol levels. These may be checked every 5 years, or more frequently if you are over 81 years old.  Skin check.  Lung cancer screening. You may have this screening every year starting at age 81 if you have a 30-pack-year history of smoking and currently smoke or have quit within the past 15 years.  Fecal occult blood test (FOBT) of the stool. You may have this test every year starting at age 30.  Flexible sigmoidoscopy or colonoscopy. You may have a sigmoidoscopy every 5 years or a colonoscopy every 10 years starting at age 81.  Hepatitis C blood test.  Hepatitis B blood test.  Sexually transmitted disease (STD) testing.  Diabetes screening. This is done by checking your blood sugar (glucose) after you have not eaten for a while (fasting). You may have this done every 1-3 years.  Bone density scan. This is done to screen for osteoporosis. You may have this done starting at age 81.  Mammogram. This may be done every 1-2 years. Talk to your health care provider about how often you should have regular mammograms. Talk with your health care provider about your test results, treatment options, and if necessary, the need for more tests. Vaccines  Your health care provider may recommend certain vaccines, such as:  Influenza vaccine. This is recommended every year.  Tetanus, diphtheria, and acellular pertussis (Tdap, Td) vaccine.  You may need a Td booster every 10 years.  Zoster vaccine. You may need this after age 64.  Pneumococcal 13-valent conjugate (PCV13) vaccine. One dose is recommended after age 81.  Pneumococcal  polysaccharide (PPSV23) vaccine. One dose is recommended after age 81. Talk to your health care provider about which screenings and vaccines you need and how often you need them. This information is not intended to replace advice given to you by your health care provider. Make sure you discuss any questions you have with your health care provider. Document Released: 12/16/2015 Document Revised: 08/08/2016 Document Reviewed: 09/20/2015 Elsevier Interactive Patient Education  2017 [employer] Prevention in the Home Falls can cause injuries. They can happen to people of all ages. There are many things you can do to make your home safe and to help prevent falls. What can I do on the outside of my home?  Regularly fix the edges of walkways and driveways and fix any cracks.  Remove anything that might make you trip as you walk through a door, such as a raised step or threshold.  Trim any bushes or trees on the path to your home.  Use bright outdoor lighting.  Clear any walking paths of anything that might make someone trip, such as rocks or tools.  Regularly check to see if handrails are loose or broken. Make sure that both sides of any steps have handrails.  Any raised decks and porches should have guardrails on the edges.  Have any leaves, snow, or ice cleared regularly.  Use sand or salt on walking paths during winter.  Clean up any spills in your garage right away. This includes oil or grease spills. What can I do in the bathroom?  Use night lights.  Install grab bars by the toilet and in the tub and shower. Do not use towel bars as grab bars.  Use non-skid mats or decals in the tub or shower.  If you need to sit down in the shower, use a plastic, non-slip stool.  Keep the floor dry. Clean up any water that spills on the floor as soon as it happens.  Remove soap buildup in the tub or shower regularly.  Attach bath mats securely with double-sided non-slip rug  tape.  Do not have throw rugs and other things on the floor that can make you trip. What can I do in the bedroom?  Use night lights.  Make sure that you have a light by your bed that is easy to reach.  Do not use any sheets or blankets that are too big for your bed. They should not hang down onto the floor.  Have a firm chair that has side arms. You can use this for support while you get dressed.  Do not have throw rugs and other things on the floor that can make you trip. What can I do in the kitchen?  Clean up any spills right away.  Avoid walking on wet floors.  Keep items that you use a lot in easy-to-reach places.  If you need to reach something above you, use a strong step stool that has a grab bar.  Keep electrical cords out of the way.  Do not use floor polish or wax that makes floors slippery. If you must use wax, use non-skid floor wax.  Do not have throw rugs and other things on the floor that can make you trip. What can I do with my stairs?  Do not leave any  items on the stairs.  Make sure that there are handrails on both sides of the stairs and use them. Fix handrails that are broken or loose. Make sure that handrails are as long as the stairways.  Check any carpeting to make sure that it is firmly attached to the stairs. Fix any carpet that is loose or worn.  Avoid having throw rugs at the top or bottom of the stairs. If you do have throw rugs, attach them to the floor with carpet tape.  Make sure that you have a light switch at the top of the stairs and the bottom of the stairs. If you do not have them, ask someone to add them for you. What else can I do to help prevent falls?  Wear shoes that:  Do not have high heels.  Have rubber bottoms.  Are comfortable and fit you well.  Are closed at the toe. Do not wear sandals.  If you use a stepladder:  Make sure that it is fully opened. Do not climb a closed stepladder.  Make sure that both sides of the  stepladder are locked into place.  Ask someone to hold it for you, if possible.  Clearly mark and make sure that you can see:  Any grab bars or handrails.  First and last steps.  Where the edge of each step is.  Use tools that help you move around (mobility aids) if they are needed. These include:  Canes.  Walkers.  Scooters.  Crutches.  Turn on the lights when you go into a dark area. Replace any light bulbs as soon as they burn out.  Set up your furniture so you have a clear path. Avoid moving your furniture around.  If any of your floors are uneven, fix them.  If there are any pets around you, be aware of where they are.  Review your medicines with your doctor. Some medicines can make you feel dizzy. This can increase your chance of falling. Ask your doctor what other things that you can do to help prevent falls. This information is not intended to replace advice given to you by your health care provider. Make sure you discuss any questions you have with your health care provider. Document Released: 09/15/2009 Document Revised: 04/26/2016 Document Reviewed: 12/24/2014 Elsevier Interactive Patient Education  2017 [employer].

## 2020-11-21 ENCOUNTER — Ambulatory Visit: Payer: Medicare Other | Admitting: Podiatry

## 2020-12-01 ENCOUNTER — Ambulatory Visit: Payer: Medicare Other | Admitting: Podiatry

## 2020-12-22 ENCOUNTER — Ambulatory Visit (INDEPENDENT_AMBULATORY_CARE_PROVIDER_SITE_OTHER): Payer: Medicare Other | Admitting: Podiatry

## 2020-12-22 ENCOUNTER — Encounter: Payer: Self-pay | Admitting: Podiatry

## 2020-12-22 ENCOUNTER — Other Ambulatory Visit: Payer: Self-pay

## 2020-12-22 DIAGNOSIS — M79675 Pain in left toe(s): Secondary | ICD-10-CM | POA: Diagnosis not present

## 2020-12-22 DIAGNOSIS — I739 Peripheral vascular disease, unspecified: Secondary | ICD-10-CM | POA: Diagnosis not present

## 2020-12-22 DIAGNOSIS — N1832 Chronic kidney disease, stage 3b: Secondary | ICD-10-CM

## 2020-12-22 DIAGNOSIS — L603 Nail dystrophy: Secondary | ICD-10-CM | POA: Diagnosis not present

## 2020-12-22 DIAGNOSIS — B351 Tinea unguium: Secondary | ICD-10-CM

## 2020-12-22 DIAGNOSIS — M79674 Pain in right toe(s): Secondary | ICD-10-CM

## 2020-12-22 NOTE — Progress Notes (Signed)
This patient returns to my office for at risk foot care.  This patient requires this care by a professional since this patient will be at risk due to having chronic kidney disease  This patient is unable to cut nails herself since the patient cannot reach her nails.These nails are painful walking and wearing shoes. Patient had left great toenail removed last visit and her toe is doing well. This patient presents for at risk foot care today.  General Appearance  Alert, conversant and in no acute stress.  Vascular  Dorsalis pedis  are palpable  Bilaterally.  Posterior tibial pulses are weakly palpable  B/L  Capillary return is within normal limits  Bilaterally.  Cold feet.  Bilaterally.  Absent digital hairs.  Neurologic  Senn-Weinstein monofilament wire test within normal limits  bilaterally. Muscle power within normal limits bilaterally.  Nails Thick disfigured discolored nails with subungual debris  from hallux to fifth toes bilaterally. No evidence of bacterial infection or drainage bilaterally.  Orthopedic  No limitations of motion  feet .  No crepitus or effusions noted.  No bony pathology or digital deformities noted.  Skin  normotropic skin with no porokeratosis noted bilaterally.  No signs of infections or ulcers noted.     Onychomycosis  Pain in right toes  Pain in left toes  Consent was obtained for treatment procedures.   Mechanical debridement of nails 1-5  bilaterally performed with a nail nipper.  Filed with dremel without incident.    Return office visit  3 months                   Told patient to return for periodic foot care and evaluation due to potential at risk complications.   Helane Gunther DPM

## 2021-02-06 ENCOUNTER — Other Ambulatory Visit: Payer: Self-pay | Admitting: Family Medicine

## 2021-02-06 DIAGNOSIS — E039 Hypothyroidism, unspecified: Secondary | ICD-10-CM

## 2021-02-20 ENCOUNTER — Other Ambulatory Visit: Payer: Self-pay | Admitting: Family Medicine

## 2021-02-20 DIAGNOSIS — E782 Mixed hyperlipidemia: Secondary | ICD-10-CM

## 2021-03-03 ENCOUNTER — Encounter: Payer: Self-pay | Admitting: Family Medicine

## 2021-03-03 ENCOUNTER — Other Ambulatory Visit: Payer: Self-pay

## 2021-03-03 ENCOUNTER — Ambulatory Visit (INDEPENDENT_AMBULATORY_CARE_PROVIDER_SITE_OTHER): Payer: Medicare Other | Admitting: Family Medicine

## 2021-03-03 VITALS — BP 130/82 | HR 76 | Temp 97.8°F | Resp 14 | Ht 62.0 in | Wt 167.0 lb

## 2021-03-03 DIAGNOSIS — I10 Essential (primary) hypertension: Secondary | ICD-10-CM | POA: Diagnosis not present

## 2021-03-03 DIAGNOSIS — E782 Mixed hyperlipidemia: Secondary | ICD-10-CM

## 2021-03-03 DIAGNOSIS — Z5181 Encounter for therapeutic drug level monitoring: Secondary | ICD-10-CM | POA: Diagnosis not present

## 2021-03-03 DIAGNOSIS — Z780 Asymptomatic menopausal state: Secondary | ICD-10-CM | POA: Diagnosis not present

## 2021-03-03 DIAGNOSIS — N183 Chronic kidney disease, stage 3 unspecified: Secondary | ICD-10-CM

## 2021-03-03 DIAGNOSIS — R7303 Prediabetes: Secondary | ICD-10-CM

## 2021-03-03 DIAGNOSIS — D631 Anemia in chronic kidney disease: Secondary | ICD-10-CM | POA: Diagnosis not present

## 2021-03-03 DIAGNOSIS — I129 Hypertensive chronic kidney disease with stage 1 through stage 4 chronic kidney disease, or unspecified chronic kidney disease: Secondary | ICD-10-CM

## 2021-03-03 DIAGNOSIS — N1831 Chronic kidney disease, stage 3a: Secondary | ICD-10-CM | POA: Diagnosis not present

## 2021-03-03 DIAGNOSIS — N2581 Secondary hyperparathyroidism of renal origin: Secondary | ICD-10-CM | POA: Diagnosis not present

## 2021-03-03 DIAGNOSIS — E039 Hypothyroidism, unspecified: Secondary | ICD-10-CM

## 2021-03-03 DIAGNOSIS — E2839 Other primary ovarian failure: Secondary | ICD-10-CM

## 2021-03-03 MED ORDER — LEVOTHYROXINE SODIUM 100 MCG PO TABS: 100.0000 ug | ORAL_TABLET | Freq: Every day | ORAL | 3 refills | Status: DC

## 2021-03-03 MED ORDER — METOPROLOL TARTRATE 25 MG PO TABS: 25.0000 mg | ORAL_TABLET | Freq: Two times a day (BID) | ORAL | 3 refills | Status: DC

## 2021-03-03 MED ORDER — ROSUVASTATIN CALCIUM 20 MG PO TABS: ORAL_TABLET | ORAL | 3 refills | Status: DC

## 2021-03-03 MED ORDER — AMLODIPINE BESYLATE 10 MG PO TABS: 10.0000 mg | ORAL_TABLET | Freq: Every day | ORAL | 3 refills | Status: DC

## 2021-03-03 MED ORDER — LOSARTAN POTASSIUM 100 MG PO TABS: 100.0000 mg | ORAL_TABLET | Freq: Every day | ORAL | 3 refills | Status: DC

## 2021-03-03 NOTE — Patient Instructions (Signed)
[facility] at [facility],  [place]  [address] Get Driving Directions Main: [phone]  Call if you want to do the bone density test   Preventing Osteoporosis, Adult Osteoporosis is a condition that causes the bones to lose density. This means that the bones become thinner, and the normal spaces in bone tissue become larger. Low bone density can make the bones weak and cause them to break more easily. Osteoporosis cannot always be prevented, but you can take steps to lower your risk of developing this condition. How can this condition affect me? If you develop osteoporosis, you will be more likely to break bones in your wrist, spine, or hip. Even a minor accident or injury can be enough to break weak bones. The bones will also be slower to heal. Osteoporosis can cause other problems as well, such as a stooped posture or trouble with movement. Osteoporosis can occur with aging. As you get older, you may lose bone tissue more quickly, or it may be replaced more slowly. Osteoporosis is more likely to develop if you have poor nutrition or do not get enough calcium or vitamin D. Other lifestyle factors can also play a role. By eating a well-balanced diet and making lifestyle changes, you can help keep your bones strong and healthy, lowering your chances of developing osteoporosis. What can increase my risk? The following factors may make you more likely to develop osteoporosis:  Having a family history of the condition.  Having poor nutrition or not getting enough calcium or vitamin D.  Using certain medicines, such as steroid medicines or anti-seizure medicines.  Being any of the following: ? 41 years of age or older. ? Female. ? A woman who has gone through menopause (is postmenopausal). ? A person who is of European or Asian descent.  Using products that contain nicotine or tobacco, such as cigarettes, e-cigarettes, and chewing  tobacco.  Not being physically active (being sedentary).  Having a small body frame. What actions can I take to prevent this? Get enough calcium  Make sure you get enough calcium every day. Calcium is the most important mineral for bone health. Most people can get enough calcium from their diet, but supplements may be recommended for people who are at risk for osteoporosis. Follow these guidelines: ? If you are age 33 or younger, aim to get 1,000 milligrams (mg) of calcium every day. ? If you are older than age 25, aim to get 1,200 mg of calcium every day.  Good sources of calcium include: ? Dairy products, such as low-fat or nonfat milk, cheese, and yogurt. ? Dark green leafy vegetables, such as bok choy and broccoli. ? Foods that have had calcium added to them (calcium-fortified foods), such as orange juice, cereal, bread, soy beverages, and tofu products. ? Nuts, such as almonds.  Check nutrition labels to see how much calcium is in a food or drink.   Get enough vitamin D  Try to get enough vitamin D every day. Vitamin D is the most essential vitamin for bone health. It helps the body absorb calcium. Follow these guidelines for how much vitamin D to get from food: ? If you are age 40 or younger, aim to get at least 600 international units (IU) every day. Your health care provider may suggest more. ? If you are older than age 51, aim to get at least 800 international units every day. Your health care provider may suggest more.  Good  sources of vitamin D in your diet include: ? Egg yolks. ? Oily fish, such as salmon, sardines, and tuna. ? Milk and cereal fortified with vitamin D.  Your body also makes vitamin D when you are out in the sun. Exposing the bare skin on your face, arms, legs, or back to the sun for no more than 30 minutes a day, 2 times a week is more than enough. Beyond that, make sure you use sunblock to protect your skin from sunburn, which increases your risk for skin  cancer. Exercise  Stay active and get exercise every day.  Ask your health care provider what types of exercise are best for you. Weight-bearing and strength-building activities are important for building and maintaining healthy bones. Some examples of these types of activities include: ? Walking and hiking. ? Jogging and running. ? Dancing. ? Gym exercises and lifting weights. ? Tennis and racquetball. ? Climbing stairs. ? Tai chi.   Make other lifestyle changes  Do not use any products that contain nicotine or tobacco, such as cigarettes, e-cigarettes, and chewing tobacco. If you need help quitting, ask your health care provider.  Lose weight if you are overweight.  If you drink alcohol: ? Limit how much you use to:  0-1 drink a day for women who are not pregnant.  0-2 drinks a day for men. ? Be aware of how much alcohol is in your drink. In the [place] one drink equals one 12 oz bottle of beer (355 mL), one 5 oz glass of wine (148 mL), or one 1 oz glass of hard liquor (44 mL). Where to find support If you need help making changes to prevent osteoporosis, talk with your health care provider. You can ask for a referral to a dietitian and a physical therapist. Where to find more information Learn more about osteoporosis from:  NIH Osteoporosis and Related [place]: www.bones.SouthExposed.es  [place] Office on [employer]: [place]s.tn  [place]: EquipmentWeekly.com.ee Summary  Osteoporosis is a condition that causes weak bones that are more likely to break.  Eat a healthy diet, making sure you get enough calcium and vitamin D, and stay active by getting regular exercise to help prevent osteoporosis.  Other ways to reduce your risk of osteoporosis include maintaining a healthy weight and avoiding alcohol and products that contain nicotine or tobacco. This information is not intended to replace advice given to you by your health care  provider. Make sure you discuss any questions you have with your health care provider. Document Revised: 05/05/2020 Document Reviewed: 05/05/2020 Elsevier Patient Education  [place].

## 2021-03-03 NOTE — Progress Notes (Signed)
Name: Janice [name]   MRN: [id number]    DOB: 1941-03-07   Date:03/03/2021       Progress Note  Chief Complaint  Patient presents with  . Hyperlipidemia  . Hypothyroidism  . Hypertension     Subjective:   Janice [name] is a 80 y.o. female, presents to clinic for routine f/up   On farxiga from nephrology  CKD stage 3 - GFR has gradually decreased over the past couple years most recently low 40's  HTN- Hypertension:  Currently managed on losartan 100 mg, norvasc 10 mg metoprolol 25 mg  Pt reports good med compliance and denies any SE.   Blood pressure today is well controlled. BP Readings from Last 3 Encounters:  03/03/21 130/82  09/02/20 126/80  03/04/20 132/76   Pt denies CP, SOB, exertional sx, LE edema, palpitation, Ha's, visual disturbances, lightheadedness, hypotension, syncope.   Hypothyroidism: Current Medication Regimen: 100 mcg synthroid Takes medicine correctly Current Symptoms: denies fatigue, weight changes, heat/cold intolerance, bowel/skin changes or CVS symptoms Most recent results are below; we will be repeating labs today. Lab Results  Component Value Date   TSH 1.75 09/02/2020   TSH 3.78 09/04/2019    Prior Vit D deficiency - with renal disease, due for dexa, not interested in doing, prior heavy smoker   hx of PAD, HLD on crestor 20 mg  Lab Results  Component Value Date   CHOL 134 09/02/2020   HDL 54 09/02/2020   LDLCALC 61 09/02/2020   TRIG 102 09/02/2020   CHOLHDL 2.5 09/02/2020  last lipids reviewed     Current Outpatient Medications:  .  amLODipine (NORVASC) 10 MG tablet, Take 1 tablet (10 mg total) by mouth daily., Disp: 90 tablet, Rfl: 3 .  aspirin EC 81 MG tablet, Take 81 mg daily by mouth., Disp: , Rfl:  .  Cholecalciferol (VITAMIN D3) 1000 UNITS CAPS, Take 1,000 Int'l Units by mouth daily., Disp: , Rfl:  .  FARXIGA 10 MG TABS tablet, Take 1 tablet by mouth daily., Disp: , Rfl:  .  levothyroxine (SYNTHROID) 100 MCG tablet, TAKE  1 TABLET BY MOUTH EVERY DAY, Disp: 90 tablet, Rfl: 0 .  losartan (COZAAR) 100 MG tablet, TAKE 1 TABLET BY MOUTH EVERY DAY, Disp: 90 tablet, Rfl: 3 .  metoprolol tartrate (LOPRESSOR) 25 MG tablet, Take 1 tablet (25 mg total) by mouth 2 (two) times daily., Disp: 180 tablet, Rfl: 3 .  rosuvastatin (CRESTOR) 20 MG tablet, TAKE 1 TABLET BY MOUTH EVERYDAY AT BEDTIME, Disp: 90 tablet, Rfl: 3  Patient Active Problem List   Diagnosis Date Noted  . Hypertensive renal disease 05/20/2018  . Anemia of chronic renal failure 05/20/2018  . 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 Anemia 04/07/2018  . Microalbuminuria 10/09/2017  . CKD (chronic kidney disease) stage 3, GFR 30-59 ml/min (HCC) 10/09/2017  . DNAR (do not attempt resuscitation) 10/08/2017  . Peripheral vascular disease (HCC) 02/01/2016  . LBBB (left bundle branch block) 01/23/2016  . Hypothyroidism 11/21/2015  . Medication monitoring encounter 11/21/2015  . Atherosclerosis of arteries 05/23/2015  . IFG (impaired fasting glucose) 05/23/2015  . Hypercholesteremia 05/23/2015  . Overweight (BMI 25.0-29.9) 05/23/2015    Past Surgical History:  Procedure Laterality Date  . artery bypass rechanneling  2010  . CHOLECYSTECTOMY    . CLOSED REDUCTION NASAL FRACTURE N/A 10/10/2017   Procedure: CLOSED REDUCTION NASAL BONE FRACTURE;  Surgeon: [name], MD;  Location: [facility] ORS;  Service: ENT;  Laterality: N/A;  .  HERNIA REPAIR  03/05/2016   15 x 22 cm retrorectus Atrium mesh repair.  . INSERTION OF MESH N/A 03/05/2016   Procedure: INSERTION OF MESH;  Surgeon: [name], MD;  Location: [facility] ORS;  Service: General;  Laterality: N/A;  . VENTRAL HERNIA REPAIR N/A 03/05/2016   Procedure: HERNIA REPAIR VENTRAL ADULT;  Surgeon: [name], MD;  Location: [facility] ORS;  Service: General;  Laterality: N/A;    Family History  Problem Relation Age of Onset  . Heart disease Mother   . Hypertension Mother   . Heart disease  Father   . Hypertension Father   . Hypertension Sister   . Diabetes Sister   . Cancer Sister        lung  . COPD Neg Hx   . Stroke Neg Hx     Social History   Tobacco Use  . Smoking status: Former Smoker    Packs/day: 2.00    Years: 50.00    Pack years: 100.00    Types: Cigarettes    Quit date: 12/03/2008    Years since quitting: 12.2  . Smokeless tobacco: Never Used  . Tobacco comment: smoking cessation materials not required  Vaping Use  . Vaping Use: Never used  Substance Use Topics  . Alcohol use: No    Alcohol/week: 0.0 standard drinks  . Drug use: No     No Known Allergies  Health Maintenance  Topic Date Due  . COVID-19 Vaccine (1) Never done  . DEXA SCAN  Never done  . TETANUS/TDAP  03/04/2021 (Originally 12/03/2018)  . INFLUENZA VACCINE  07/03/2021  . PNA vac Low Risk Adult  Completed  . HPV VACCINES  Aged Out    Chart Review Today: I personally reviewed active problem list, medication list, allergies, family history, social history, health maintenance, notes from last encounter, lab results, imaging with the patient/caregiver today.   Review of Systems  Constitutional: Negative.   HENT: Negative.   Eyes: Negative.   Respiratory: Negative.   Cardiovascular: Negative.   Gastrointestinal: Negative.   Endocrine: Negative.   Genitourinary: Negative.   Musculoskeletal: Negative.   Skin: Negative.   Allergic/Immunologic: Negative.   Neurological: Negative.   Hematological: Negative.   Psychiatric/Behavioral: Negative.   All other systems reviewed and are negative.    Objective:   Vitals:   03/03/21 1032  BP: 130/82  Pulse: 76  Resp: 14  Temp: 97.8 F (36.6 C)  TempSrc: Oral  SpO2: 99%  Weight: 167 lb (75.8 kg)  Height: 5\' 2"  (1.575 m)    Body mass index is 30.54 kg/m.  Physical Exam Vitals and nursing note reviewed.  Constitutional:      General: She is not in acute distress.    Appearance: Normal appearance. She is well-developed.  She is obese. She is not ill-appearing, toxic-appearing or diaphoretic.     Interventions: Face mask in place.  HENT:     Head: Normocephalic and atraumatic.     Right Ear: External ear normal.     Left Ear: External ear normal.  Eyes:     General: Lids are normal. No scleral icterus.       Right eye: No discharge.        Left eye: No discharge.     Conjunctiva/sclera: Conjunctivae normal.  Neck:     Trachea: Phonation normal. No tracheal deviation.  Cardiovascular:     Rate and Rhythm: Normal rate and regular rhythm.     Pulses: Normal pulses.  Radial pulses are 2+ on the right side and 2+ on the left side.       Posterior tibial pulses are 2+ on the right side and 2+ on the left side.     Heart sounds: Normal heart sounds. No murmur heard. No friction rub. No gallop.   Pulmonary:     Effort: Pulmonary effort is normal. No respiratory distress.     Breath sounds: Normal breath sounds. No stridor. No wheezing, rhonchi or rales.  Chest:     Chest wall: No tenderness.  Abdominal:     General: Bowel sounds are normal. There is no distension.     Palpations: Abdomen is soft.  Musculoskeletal:     Right lower leg: No edema.     Left lower leg: No edema.  Skin:    General: Skin is warm and dry.     Coloration: Skin is not jaundiced or pale.     Findings: No rash.  Neurological:     Mental Status: She is alert.     Motor: No abnormal muscle tone.     Gait: Gait normal.  Psychiatric:        Mood and Affect: Mood normal.        Speech: Speech normal.        Behavior: Behavior normal.         Assessment & Plan:     ICD-10-CM   1. 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 Hypothyroidism, unspecified type  E03.9 TSH    DISCONTINUED: levothyroxine (SYNTHROID) 100 MCG tablet   adjust meds pending labs, monitoring  3. Mixed hyperlipidemia  E78.2 COMPLETE METABOLIC PANEL WITH GFR    rosuvastatin (CRESTOR) 20 MG tablet   compliant with statin, last labs reviewed, no concerns or new se with meds, no new concerning cardiac or PAD sx  4. Essential hypertension, benign  I10    stable, well controlled, bp at goal today  5. Anemia due to stage 3a chronic kidney disease (HCC)  N18.31 CBC w/Diff/Platelet   D63.1 VITAMIN D 25 Hydroxy (Vit-D Deficiency, Fractures)   monitoring  6. Prediabetes  R73.03 COMPLETE METABOLIC PANEL WITH GFR    Hemoglobin A1c   recheck  7. Stage 3 chronic kidney disease, unspecified whether stage 3a or 3b CKD (HCC)  N18.30 COMPLETE METABOLIC PANEL WITH GFR    VITAMIN D 25 Hydroxy (Vit-D Deficiency, Fractures)   per nephrology   8. Encounter for medication monitoring  Z51.81 CBC w/Diff/Platelet    COMPLETE METABOLIC PANEL WITH GFR  9. Postmenopausal estrogen deficiency  Z78.0 DG Bone Density  10. Hyperparathyroidism, secondary renal (HCC) Chronic N25.81 COMPLETE METABOLIC PANEL WITH GFR    DG Bone Density    TSH    VITAMIN D 25 Hydroxy (Vit-D Deficiency, Fractures)   see notes below   Pt sees nephrology- no recent Vit D but PTH and phos done - doing bone density - will check CMP/calcium/Vit D with dexa ordered  Hand out regarding osteoporosis prevention printed and reviewed, HM list also printed and reviewed  Return in about 6 months (around 09/02/2021) for Routine follow-up.   Danelle Berry, PA-C 03/03/21  10:44 AM

## 2021-03-04 LAB — COMPLETE METABOLIC PANEL WITH GFR
AG Ratio: 1.5 (calc) (ref 1.0–2.5)
ALT: 8 U/L (ref 6–29)
AST: 19 U/L (ref 10–35)
Albumin: 4.4 g/dL (ref 3.6–5.1)
Alkaline phosphatase (APISO): 85 U/L (ref 37–153)
BUN/Creatinine Ratio: 16 (calc) (ref 6–22)
BUN: 20 mg/dL (ref 7–25)
CO2: 21 mmol/L (ref 20–32)
Calcium: 9.9 mg/dL (ref 8.6–10.4)
Chloride: 104 mmol/L (ref 98–110)
Creat: 1.25 mg/dL — ABNORMAL HIGH (ref 0.60–0.88)
GFR, Est African American: 47 mL/min/{1.73_m2} — ABNORMAL LOW (ref >=60)
GFR, Est Non African American: 40 mL/min/{1.73_m2} — ABNORMAL LOW (ref >=60)
Globulin: 3 g/dL (calc) (ref 1.9–3.7)
Glucose, Bld: 115 mg/dL — ABNORMAL HIGH (ref 65–99)
Potassium: 4.8 mmol/L (ref 3.5–5.3)
Sodium: 138 mmol/L (ref 135–146)
Total Bilirubin: 0.4 mg/dL (ref 0.2–1.2)
Total Protein: 7.4 g/dL (ref 6.1–8.1)

## 2021-03-04 LAB — CBC WITH DIFFERENTIAL/PLATELET
Absolute Monocytes: 557 cells/uL (ref 200–950)
Basophils Absolute: 113 cells/uL (ref 0–200)
Basophils Relative: 1.3 %
Eosinophils Absolute: 131 cells/uL (ref 15–500)
Eosinophils Relative: 1.5 %
HCT: 37.9 % (ref 35.0–45.0)
Hemoglobin: 12.2 g/dL (ref 11.7–15.5)
Lymphs Abs: 1209 cells/uL (ref 850–3900)
MCH: 29 pg (ref 27.0–33.0)
MCHC: 32.2 g/dL (ref 32.0–36.0)
MCV: 90.2 fL (ref 80.0–100.0)
MPV: 11.4 fL (ref 7.5–12.5)
Monocytes Relative: 6.4 %
Neutro Abs: 6690 cells/uL (ref 1500–7800)
Neutrophils Relative %: 76.9 %
Platelets: 223 10*3/uL (ref 140–400)
RBC: 4.2 10*6/uL (ref 3.80–5.10)
RDW: 12.8 % (ref 11.0–15.0)
Total Lymphocyte: 13.9 %
WBC: 8.7 10*3/uL (ref 3.8–10.8)

## 2021-03-04 LAB — HEMOGLOBIN A1C
Hgb A1c MFr Bld: 5.6 % of total Hgb (ref <5.7)
Mean Plasma Glucose: 114 mg/dL
eAG (mmol/L): 6.3 mmol/L

## 2021-03-04 LAB — TSH: TSH: 4.81 mIU/L — ABNORMAL HIGH (ref 0.40–4.50)

## 2021-03-04 LAB — VITAMIN D 25 HYDROXY (VIT D DEFICIENCY, FRACTURES): Vit D, 25-Hydroxy: 33 ng/mL (ref 30–100)

## 2021-03-09 ENCOUNTER — Encounter: Payer: Self-pay | Admitting: Family Medicine

## 2021-03-09 MED ORDER — LEVOTHYROXINE SODIUM 112 MCG PO TABS
112.0000 ug | ORAL_TABLET | Freq: Every day | ORAL | 1 refills | Status: DC
Start: 2021-03-09 — End: 2021-04-19

## 2021-03-16 ENCOUNTER — Emergency Department
Admission: EM | Admit: 2021-03-16 | Discharge: 2021-03-16 | Disposition: A | Discharge status: Home / Self Care | Payer: Medicare Other | Attending: Emergency Medicine | Admitting: Emergency Medicine

## 2021-03-16 ENCOUNTER — Other Ambulatory Visit: Payer: Self-pay

## 2021-03-16 DIAGNOSIS — I129 Hypertensive chronic kidney disease with stage 1 through stage 4 chronic kidney disease, or unspecified chronic kidney disease: Secondary | ICD-10-CM | POA: Insufficient documentation

## 2021-03-16 DIAGNOSIS — Z79899 Other long term (current) drug therapy: Secondary | ICD-10-CM | POA: Insufficient documentation

## 2021-03-16 DIAGNOSIS — Z7982 Long term (current) use of aspirin: Secondary | ICD-10-CM | POA: Diagnosis not present

## 2021-03-16 DIAGNOSIS — Z87891 Personal history of nicotine dependence: Secondary | ICD-10-CM | POA: Insufficient documentation

## 2021-03-16 DIAGNOSIS — R197 Diarrhea, unspecified: Secondary | ICD-10-CM | POA: Diagnosis not present

## 2021-03-16 DIAGNOSIS — E039 Hypothyroidism, unspecified: Secondary | ICD-10-CM | POA: Diagnosis not present

## 2021-03-16 DIAGNOSIS — R109 Unspecified abdominal pain: Secondary | ICD-10-CM | POA: Insufficient documentation

## 2021-03-16 DIAGNOSIS — N183 Chronic kidney disease, stage 3 unspecified: Secondary | ICD-10-CM | POA: Diagnosis not present

## 2021-03-16 DIAGNOSIS — I10 Essential (primary) hypertension: Secondary | ICD-10-CM | POA: Diagnosis not present

## 2021-03-16 LAB — BASIC METABOLIC PANEL
Anion gap: 12 (ref 5–15)
BUN: 28 mg/dL — ABNORMAL HIGH (ref 8–23)
CO2: 20 mmol/L — ABNORMAL LOW (ref 22–32)
Calcium: 9.7 mg/dL (ref 8.9–10.3)
Chloride: 104 mmol/L (ref 98–111)
Creatinine, Ser: 1.86 mg/dL — ABNORMAL HIGH (ref 0.44–1.00)
GFR, Estimated: 27 mL/min — ABNORMAL LOW (ref >60)
Glucose, Bld: 128 mg/dL — ABNORMAL HIGH (ref 70–99)
Potassium: 4 mmol/L (ref 3.5–5.1)
Sodium: 136 mmol/L (ref 135–145)

## 2021-03-16 LAB — CBC WITH DIFFERENTIAL/PLATELET
Abs Immature Granulocytes: 0.03 10*3/uL (ref 0.00–0.07)
Basophils Absolute: 0 10*3/uL (ref 0.0–0.1)
Basophils Relative: 1 %
Eosinophils Absolute: 0 10*3/uL (ref 0.0–0.5)
Eosinophils Relative: 0 %
HCT: 38.6 % (ref 36.0–46.0)
Hemoglobin: 12.5 g/dL (ref 12.0–15.0)
Immature Granulocytes: 0 %
Lymphocytes Relative: 4 %
Lymphs Abs: 0.3 10*3/uL — ABNORMAL LOW (ref 0.7–4.0)
MCH: 29.3 pg (ref 26.0–34.0)
MCHC: 32.4 g/dL (ref 30.0–36.0)
MCV: 90.6 fL (ref 80.0–100.0)
Monocytes Absolute: 0.6 10*3/uL (ref 0.1–1.0)
Monocytes Relative: 7 %
Neutro Abs: 7.3 10*3/uL (ref 1.7–7.7)
Neutrophils Relative %: 88 %
Platelets: 221 10*3/uL (ref 150–400)
RBC: 4.26 MIL/uL (ref 3.87–5.11)
RDW: 13.5 % (ref 11.5–15.5)
WBC: 8.3 10*3/uL (ref 4.0–10.5)
nRBC: 0 % (ref 0.0–0.2)

## 2021-03-16 MED ORDER — SODIUM CHLORIDE 0.9 % IV BOLUS
500.0000 mL | Freq: Once | INTRAVENOUS | Status: AC
  Administered 2021-03-16: 500 mL via INTRAVENOUS

## 2021-03-16 MED ORDER — DICYCLOMINE HCL 10 MG PO CAPS
10.0000 mg | ORAL_CAPSULE | Freq: Once | ORAL | Status: AC
  Administered 2021-03-16: 10 mg via ORAL
  Filled 2021-03-16: qty 1

## 2021-03-16 MED ORDER — LOPERAMIDE HCL 2 MG PO CAPS
2.0000 mg | ORAL_CAPSULE | Freq: Once | ORAL | Status: AC
  Administered 2021-03-16: 2 mg via ORAL
  Filled 2021-03-16: qty 1

## 2021-03-16 NOTE — ED Notes (Signed)
RN Florentina Addison called and informed pt is on the way to room at this time.

## 2021-03-16 NOTE — ED Provider Notes (Signed)
[facility] Emergency Department Provider Note   ____________________________________________   I have reviewed the triage vital signs and the nursing notes.   HISTORY  Chief Complaint Diarrhea   History limited by: Not Limited   HPI Janice [name] is a 80 y.o. female who presents to the emergency department today because of concerns for diarrhea.  Patient states she has now had diarrhea for the past 3 days.  She describes it as watery.  It has been nonbloody.  The patient states that the diarrhea has been accompanied by somewhat diffuse abdominal discomfort.  She denies any nausea or vomiting.  She denies any fevers.  Denies any unusual ingestions prior to the diarrhea starting.  She did recently have her levothyroxine increased from 100 mg to 112.  Patient denies any known sick contacts.   Records reviewed. Per medical record review patient has a history of CKD, HLD, HTN. Hypothyroid.   Past Medical History:  Diagnosis Date  . Chronic kidney disease   . Hernia, ventral   . Hyperlipidemia   . Hypertension   . Subclavian arterial stenosis (HCC)   . Thyroid disease     Patient Active Problem List   Diagnosis Date Noted  . Prediabetes 03/03/2021  . Hypertensive renal disease 05/20/2018  . Anemia of chronic renal failure 05/20/2018  . 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 Anemia 04/07/2018  . Microalbuminuria 10/09/2017  . Stage 3 chronic kidney disease (HCC) 10/09/2017  . DNAR (do not attempt resuscitation) 10/08/2017  . Mixed hyperlipidemia 02/01/2016  . Peripheral vascular disease (HCC) 02/01/2016  . LBBB (left bundle branch block) 01/23/2016  . Hypothyroidism 11/21/2015  . Medication monitoring encounter 11/21/2015  . Atherosclerosis of arteries 05/23/2015  . IFG (impaired fasting glucose) 05/23/2015  . Hypercholesteremia 05/23/2015  . Overweight (BMI 25.0-29.9) 05/23/2015    Past Surgical History:  Procedure Laterality  Date  . artery bypass rechanneling  2010  . CHOLECYSTECTOMY    . CLOSED REDUCTION NASAL FRACTURE N/A 10/10/2017   Procedure: CLOSED REDUCTION NASAL BONE FRACTURE;  Surgeon: [name], MD;  Location: [facility] ORS;  Service: ENT;  Laterality: N/A;  . HERNIA REPAIR  03/05/2016   15 x 22 cm retrorectus Atrium mesh repair.  . INSERTION OF MESH N/A 03/05/2016   Procedure: INSERTION OF MESH;  Surgeon: [name], MD;  Location: [facility] ORS;  Service: General;  Laterality: N/A;  . VENTRAL HERNIA REPAIR N/A 03/05/2016   Procedure: HERNIA REPAIR VENTRAL ADULT;  Surgeon: [name], MD;  Location: [facility] ORS;  Service: General;  Laterality: N/A;    Prior to Admission medications   Medication Sig Start Date End Date Taking? Authorizing Provider  amLODipine (NORVASC) 10 MG tablet Take 1 tablet (10 mg total) by mouth daily. 03/03/21   Danelle Berry, PA-C  aspirin EC 81 MG tablet Take 81 mg daily by mouth.    [provider]  Cholecalciferol (VITAMIN D3) 1000 UNITS CAPS Take 1,000 Int'l Units by mouth daily.    [provider]  FARXIGA 10 MG TABS tablet Take 1 tablet by mouth daily. 09/23/20   [provider]  levothyroxine (SYNTHROID) 112 MCG tablet Take 1 tablet (112 mcg total) by mouth daily. 03/09/21   Danelle Berry, PA-C  losartan (COZAAR) 100 MG tablet Take 1 tablet (100 mg total) by mouth daily. 03/03/21   Danelle Berry, PA-C  metoprolol tartrate (LOPRESSOR) 25 MG tablet Take 1 tablet (25 mg total) by mouth 2 (two) times daily. 03/03/21  Danelle Berry, PA-C  rosuvastatin (CRESTOR) 20 MG tablet TAKE 1 TABLET BY MOUTH EVERYDAY AT BEDTIME 03/03/21   Danelle Berry, PA-C    Allergies Patient has no known allergies.  Family History  Problem Relation Age of Onset  . Heart disease Mother   . Hypertension Mother   . Heart disease Father   . Hypertension Father   . Hypertension Sister   . Diabetes Sister   . Cancer Sister        lung  . COPD Neg Hx   . Stroke Neg Hx      Social History Social History   Tobacco Use  . Smoking status: Former Smoker    Packs/day: 2.00    Years: 50.00    Pack years: 100.00    Types: Cigarettes    Quit date: 12/03/2008    Years since quitting: 12.2  . Smokeless tobacco: Never Used  . Tobacco comment: smoking cessation materials not required  Vaping Use  . Vaping Use: Never used  Substance Use Topics  . Alcohol use: No    Alcohol/week: 0.0 standard drinks  . Drug use: No    Review of Systems Constitutional: No fever/chills Eyes: No visual changes. ENT: No sore throat. Cardiovascular: Denies chest pain. Respiratory: Denies shortness of breath. Gastrointestinal: Positive for abdominal discomfort. Positive for diarrhea.   Genitourinary: Negative for dysuria. Musculoskeletal: Negative for back pain. Skin: Negative for rash. Neurological: Negative for headaches, focal weakness or numbness.  ____________________________________________   PHYSICAL EXAM:  VITAL SIGNS: ED Triage Vitals  Enc Vitals Group     BP 03/16/21 0831 130/82     Pulse Rate 03/16/21 0831 76     Resp 03/16/21 0831 16     Temp 03/16/21 0826 98.9 F (37.2 C)     Temp Source 03/16/21 0826 Oral     SpO2 03/16/21 0831 100 %     Weight 03/16/21 0826 162 lb (73.5 kg)     Height 03/16/21 0826 5\' 2"  (1.575 m)     Head Circumference --      Peak Flow --      Pain Score 03/16/21 0825 5   Constitutional: Alert and oriented.  Eyes: Conjunctivae are normal.  ENT      Head: Normocephalic and atraumatic.      Nose: No congestion/rhinnorhea.      Mouth/Throat: Mucous membranes are moist.      Neck: No stridor. Hematological/Lymphatic/Immunilogical: No cervical lymphadenopathy. Cardiovascular: Normal rate, regular rhythm.  No murmurs, rubs, or gallops.  Respiratory: Normal respiratory effort without tachypnea nor retractions. Breath sounds are clear and equal bilaterally. No wheezes/rales/rhonchi. Gastrointestinal: Soft and non tender. No  rebound. No guarding.  Genitourinary: Deferred Musculoskeletal: Normal range of motion in all extremities. No lower extremity edema. Neurologic:  Normal speech and language. No gross focal neurologic deficits are appreciated.  Skin:  Skin is warm, dry and intact. No rash noted. Psychiatric: Mood and affect are normal. Speech and behavior are normal. Patient exhibits appropriate insight and judgment.  ____________________________________________    LABS (pertinent positives/negatives)  CBC wbc 8.3, hgb 12.5, plt 221 BMP na 136, k 4.0, glu 128, cr 1.86  ____________________________________________   EKG  I, 03/18/21, attending physician, personally viewed and interpreted this EKG  EKG Time: 0845 Rate: 71 Rhythm: sinus rhythm Axis: left axis deviation Intervals: qtc 488 QRS: LBBB ST changes: no st elevation Impression: abnormal ekg  ____________________________________________    RADIOLOGY  None  ____________________________________________   PROCEDURES  Procedures  ____________________________________________   INITIAL IMPRESSION / ASSESSMENT AND PLAN / ED COURSE  Pertinent labs & imaging results that were available during my care of the patient were reviewed by me and considered in my medical decision making (see chart for details).   Patient presented to the emergency department today because of concern for diarrhea. On exam patient without any abdominal tenderness. Blood work shows a slightly elevated creatinine over her baseline. Patient was given iv fluids here. Was observed for a number of hours. Was given imodium and had no further diarrhea after that. At this time do think likely gastroenteritis. Think less likely significant intraabdominal infection. Did encourage patient to focus on hydration, will however plan on discharge home.   ____________________________________________   FINAL CLINICAL IMPRESSION(S) / ED DIAGNOSES  Final diagnoses:   Diarrhea, unspecified type     Note: This dictation was prepared with Dragon dictation. Any transcriptional errors that result from this process are unintentional     Phineas Semen, MD 03/16/21 1506

## 2021-03-16 NOTE — ED Notes (Signed)
Patient was assisted back to stretcher after having a loose stool. Patient will call if need assistance to get off of the stretcher.

## 2021-03-16 NOTE — ED Triage Notes (Signed)
Pt stated that she started having diarrhea yesyterday morning; complaints of abd pain and took pepto bismol yesterday and last night;

## 2021-03-16 NOTE — ED Notes (Signed)
Pt assisted to bathroom

## 2021-03-16 NOTE — ED Notes (Addendum)
Patient denies any further episodes of diarrhea after being given Immodium. MD aware.

## 2021-03-16 NOTE — Discharge Instructions (Addendum)
As we discussed please have your blood work checked next week. Please seek medical attention for any high fevers, chest pain, shortness of breath, change in behavior, persistent vomiting, bloody stool or any other new or concerning symptoms.

## 2021-03-22 DIAGNOSIS — I129 Hypertensive chronic kidney disease with stage 1 through stage 4 chronic kidney disease, or unspecified chronic kidney disease: Secondary | ICD-10-CM | POA: Diagnosis not present

## 2021-03-22 DIAGNOSIS — N2581 Secondary hyperparathyroidism of renal origin: Secondary | ICD-10-CM | POA: Diagnosis not present

## 2021-03-22 DIAGNOSIS — R809 Proteinuria, unspecified: Secondary | ICD-10-CM | POA: Diagnosis not present

## 2021-03-22 DIAGNOSIS — N1831 Chronic kidney disease, stage 3a: Secondary | ICD-10-CM | POA: Diagnosis not present

## 2021-03-23 ENCOUNTER — Other Ambulatory Visit: Payer: Self-pay

## 2021-03-23 ENCOUNTER — Ambulatory Visit (INDEPENDENT_AMBULATORY_CARE_PROVIDER_SITE_OTHER): Payer: Medicare Other | Admitting: Podiatry

## 2021-03-23 ENCOUNTER — Encounter: Payer: Self-pay | Admitting: Podiatry

## 2021-03-23 DIAGNOSIS — N1832 Chronic kidney disease, stage 3b: Secondary | ICD-10-CM | POA: Diagnosis not present

## 2021-03-23 DIAGNOSIS — I739 Peripheral vascular disease, unspecified: Secondary | ICD-10-CM | POA: Diagnosis not present

## 2021-03-23 DIAGNOSIS — B351 Tinea unguium: Secondary | ICD-10-CM | POA: Diagnosis not present

## 2021-03-23 DIAGNOSIS — M79674 Pain in right toe(s): Secondary | ICD-10-CM | POA: Diagnosis not present

## 2021-03-23 DIAGNOSIS — L603 Nail dystrophy: Secondary | ICD-10-CM | POA: Diagnosis not present

## 2021-03-23 DIAGNOSIS — M79675 Pain in left toe(s): Secondary | ICD-10-CM

## 2021-03-23 NOTE — Progress Notes (Signed)
This patient returns to my office for at risk foot care.  This patient requires this care by a professional since this patient will be at risk due to having chronic kidney disease  This patient is unable to cut nails herself since the patient cannot reach her nails.These nails are painful walking and wearing shoes. Patient had left great toenail removed last visit and her toe is doing well. This patient presents for at risk foot care today.  General Appearance  Alert, conversant and in no acute stress.  Vascular  Dorsalis pedis  are palpable  Bilaterally.  Posterior tibial pulses are weakly palpable  B/L  Capillary return is within normal limits  Bilaterally.  Cold feet.  Bilaterally.  Absent digital hairs.  Neurologic  Senn-Weinstein monofilament wire test within normal limits  bilaterally. Muscle power within normal limits bilaterally.  Nails Thick disfigured discolored nails with subungual debris  from hallux to fifth toes bilaterally. No evidence of bacterial infection or drainage bilaterally.   Orthopedic  No limitations of motion  feet .  No crepitus or effusions noted.  No bony pathology or digital deformities noted.  Skin  normotropic skin with no porokeratosis noted bilaterally.  No signs of infections or ulcers noted.     Onychomycosis  Pain in right toes  Pain in left toes  Consent was obtained for treatment procedures.   Mechanical debridement of nails 1-5  bilaterally performed with a nail nipper.  Filed with dremel without incident.      Return office visit  10 weeks                   Told patient to return for periodic foot care and evaluation due to potential at risk complications.   Lemma Tetro DPM  

## 2021-03-27 DIAGNOSIS — N1832 Chronic kidney disease, stage 3b: Secondary | ICD-10-CM | POA: Diagnosis not present

## 2021-03-27 DIAGNOSIS — I129 Hypertensive chronic kidney disease with stage 1 through stage 4 chronic kidney disease, or unspecified chronic kidney disease: Secondary | ICD-10-CM | POA: Diagnosis not present

## 2021-03-27 DIAGNOSIS — N2581 Secondary hyperparathyroidism of renal origin: Secondary | ICD-10-CM | POA: Diagnosis not present

## 2021-03-27 DIAGNOSIS — R809 Proteinuria, unspecified: Secondary | ICD-10-CM | POA: Diagnosis not present

## 2021-04-01 ENCOUNTER — Other Ambulatory Visit: Payer: Self-pay | Admitting: Family Medicine

## 2021-04-18 ENCOUNTER — Other Ambulatory Visit: Payer: Self-pay

## 2021-04-18 DIAGNOSIS — E039 Hypothyroidism, unspecified: Secondary | ICD-10-CM

## 2021-04-19 ENCOUNTER — Other Ambulatory Visit: Payer: Self-pay | Admitting: Family Medicine

## 2021-04-19 LAB — TSH: TSH: 0.58 mIU/L (ref 0.40–4.50)

## 2021-04-19 MED ORDER — LEVOTHYROXINE SODIUM 112 MCG PO TABS: 112.0000 ug | ORAL_TABLET | Freq: Every day | ORAL | 3 refills | Status: DC

## 2021-06-01 ENCOUNTER — Encounter: Payer: Self-pay | Admitting: Podiatry

## 2021-06-01 ENCOUNTER — Ambulatory Visit (INDEPENDENT_AMBULATORY_CARE_PROVIDER_SITE_OTHER): Payer: Medicare Other | Admitting: Podiatry

## 2021-06-01 ENCOUNTER — Other Ambulatory Visit: Payer: Self-pay

## 2021-06-01 DIAGNOSIS — M79674 Pain in right toe(s): Secondary | ICD-10-CM | POA: Diagnosis not present

## 2021-06-01 DIAGNOSIS — B351 Tinea unguium: Secondary | ICD-10-CM | POA: Diagnosis not present

## 2021-06-01 DIAGNOSIS — M79675 Pain in left toe(s): Secondary | ICD-10-CM | POA: Diagnosis not present

## 2021-06-01 DIAGNOSIS — L603 Nail dystrophy: Secondary | ICD-10-CM | POA: Diagnosis not present

## 2021-06-01 DIAGNOSIS — I739 Peripheral vascular disease, unspecified: Secondary | ICD-10-CM

## 2021-06-01 DIAGNOSIS — N1832 Chronic kidney disease, stage 3b: Secondary | ICD-10-CM

## 2021-06-01 NOTE — Progress Notes (Signed)
This patient returns to my office for at risk foot care.  This patient requires this care by a professional since this patient will be at risk due to having chronic kidney disease  This patient is unable to cut nails herself since the patient cannot reach her nails.These nails are painful walking and wearing shoes. Patient had left great toenail removed last visit and her toe is doing well. This patient presents for at risk foot care today.  General Appearance  Alert, conversant and in no acute stress.  Vascular  Dorsalis pedis  are palpable  Bilaterally.  Posterior tibial pulses are weakly palpable  B/L  Capillary return is within normal limits  Bilaterally.  Cold feet.  Bilaterally.  Absent digital hairs.  Neurologic  Senn-Weinstein monofilament wire test within normal limits  bilaterally. Muscle power within normal limits bilaterally.  Nails Thick disfigured discolored nails with subungual debris  from hallux to fifth toes bilaterally. No evidence of bacterial infection or drainage bilaterally.   Orthopedic  No limitations of motion  feet .  No crepitus or effusions noted.  No bony pathology or digital deformities noted.  Skin  normotropic skin with no porokeratosis noted bilaterally.  No signs of infections or ulcers noted.     Onychomycosis  Pain in right toes  Pain in left toes  Consent was obtained for treatment procedures.   Mechanical debridement of nails 1-5  bilaterally performed with a nail nipper.  Filed with dremel without incident.      Return office visit  10 weeks                   Told patient to return for periodic foot care and evaluation due to potential at risk complications.   Triston Lisanti DPM  

## 2021-08-10 ENCOUNTER — Encounter: Payer: Self-pay | Admitting: Podiatry

## 2021-08-10 ENCOUNTER — Other Ambulatory Visit: Payer: Self-pay

## 2021-08-10 ENCOUNTER — Ambulatory Visit (INDEPENDENT_AMBULATORY_CARE_PROVIDER_SITE_OTHER): Payer: Medicare Other | Admitting: Podiatry

## 2021-08-10 ENCOUNTER — Encounter (INDEPENDENT_AMBULATORY_CARE_PROVIDER_SITE_OTHER): Payer: Self-pay

## 2021-08-10 DIAGNOSIS — N1832 Chronic kidney disease, stage 3b: Secondary | ICD-10-CM

## 2021-08-10 DIAGNOSIS — M79674 Pain in right toe(s): Secondary | ICD-10-CM | POA: Diagnosis not present

## 2021-08-10 DIAGNOSIS — L603 Nail dystrophy: Secondary | ICD-10-CM

## 2021-08-10 DIAGNOSIS — B351 Tinea unguium: Secondary | ICD-10-CM

## 2021-08-10 DIAGNOSIS — M79675 Pain in left toe(s): Secondary | ICD-10-CM | POA: Diagnosis not present

## 2021-08-10 DIAGNOSIS — I739 Peripheral vascular disease, unspecified: Secondary | ICD-10-CM | POA: Diagnosis not present

## 2021-08-10 NOTE — Progress Notes (Signed)
This patient returns to my office for at risk foot care.  This patient requires this care by a professional since this patient will be at risk due to having chronic kidney disease  This patient is unable to cut nails herself since the patient cannot reach her nails.These nails are painful walking and wearing shoes. Patient had left great toenail removed last visit and her toe is doing well. This patient presents for at risk foot care today.  General Appearance  Alert, conversant and in no acute stress.  Vascular  Dorsalis pedis  are palpable  Bilaterally.  Posterior tibial pulses are weakly palpable  B/L  Capillary return is within normal limits  Bilaterally.  Cold feet.  Bilaterally.  Absent digital hairs.  Neurologic  Senn-Weinstein monofilament wire test within normal limits  bilaterally. Muscle power within normal limits bilaterally.  Nails Thick disfigured discolored nails with subungual debris  from hallux to fifth toes bilaterally. No evidence of bacterial infection or drainage bilaterally. Her third toenail is self avulsing and the nail plate is unattached.  No drainage or infection.  Orthopedic  No limitations of motion  feet .  No crepitus or effusions noted.  No bony pathology or digital deformities noted.  Skin  normotropic skin with no porokeratosis noted bilaterally.  No signs of infections or ulcers noted.     Onychomycosis  Pain in right toes  Pain in left toes  Consent was obtained for treatment procedures.   Mechanical debridement of nails 1-5  bilaterally performed with a nail nipper.  Filed with dremel without incident.  Medication/DSD applied to third toe nail bed.     Return office visit  10 weeks                   Told patient to return for periodic foot care and evaluation due to potential at risk complications.   Helane Gunther DPM

## 2021-09-08 ENCOUNTER — Ambulatory Visit (INDEPENDENT_AMBULATORY_CARE_PROVIDER_SITE_OTHER): Payer: Medicare Other | Admitting: Internal Medicine

## 2021-09-08 ENCOUNTER — Other Ambulatory Visit: Payer: Self-pay

## 2021-09-08 ENCOUNTER — Encounter: Payer: Self-pay | Admitting: Internal Medicine

## 2021-09-08 VITALS — BP 132/68 | HR 80 | Temp 98.2°F | Resp 18 | Ht 62.0 in | Wt 165.2 lb

## 2021-09-08 DIAGNOSIS — Z13820 Encounter for screening for osteoporosis: Secondary | ICD-10-CM

## 2021-09-08 DIAGNOSIS — D631 Anemia in chronic kidney disease: Secondary | ICD-10-CM

## 2021-09-08 DIAGNOSIS — E039 Hypothyroidism, unspecified: Secondary | ICD-10-CM

## 2021-09-08 DIAGNOSIS — R7303 Prediabetes: Secondary | ICD-10-CM | POA: Diagnosis not present

## 2021-09-08 DIAGNOSIS — E782 Mixed hyperlipidemia: Secondary | ICD-10-CM

## 2021-09-08 DIAGNOSIS — N2581 Secondary hyperparathyroidism of renal origin: Secondary | ICD-10-CM | POA: Diagnosis not present

## 2021-09-08 DIAGNOSIS — I129 Hypertensive chronic kidney disease with stage 1 through stage 4 chronic kidney disease, or unspecified chronic kidney disease: Secondary | ICD-10-CM | POA: Diagnosis not present

## 2021-09-08 DIAGNOSIS — Z23 Encounter for immunization: Secondary | ICD-10-CM

## 2021-09-08 DIAGNOSIS — N1831 Chronic kidney disease, stage 3a: Secondary | ICD-10-CM

## 2021-09-08 NOTE — Progress Notes (Signed)
Janice [name] is a 80 y.o. female that presents today for Hypertension, Hypothyroidism, Hyperlipidemia, and Diabetes .  Chronic medical conditions include: HTN, CKD3, hypothyroidism, HLD, pre-diabetes  HPI: Janice [name] is a 80 year old female here for 6 month follow up. Overall she is doing well and has no questions or concerns today.   Hypertension - Medications: Losartan 100 mg, norvasc 10 mg, metoprolol 25 mg BID - She is compliant with above medications and reports no side effects - Checking BP at home: average about 135/80 - Denies any SOB, CP, vision changes or symptoms of hypotension - Does endorse chronic BLE swelling, R>L since vascular surgery  HLD - Medications: Crestor 20 mg - She is compliant with above medications and reports no side effects - Last lipid panel 10/21 without abnormalities   CKD3 - Currently following with Nephrology, will see them in 2 weeks  - Last Cr 4/22 of 1.25, baseline ~ 1.1-1.2 - Currently taking Farxiga per Neprho, reports no side effects  Hypothyroidism - Medications: Levothyroxine 112 mcg  - Last TSH 0.58 in 5/22 - Doing well since increase in dose, denies anxiety, weight loss, lack of appetite, palpitations, skin changes  Vitamin D Deficiency: - DEXA ordered at last visit - patient does not want to reschedule this - Currently taking 1000 IU daily - Last Vitamin D level 4/22 33  Review of Systems  Constitutional:  Negative for appetite change, chills, fever and unexpected weight change.  Eyes:  Negative for visual disturbance.  Respiratory:  Negative for cough and shortness of breath.   Cardiovascular:  Positive for leg swelling. Negative for chest pain and palpitations.  Gastrointestinal:  Negative for abdominal pain, blood in stool, constipation and diarrhea.  Endocrine: Negative for cold intolerance and heat intolerance.  Genitourinary:  Negative for dysuria and hematuria.  Skin: Negative.    PAST MEDICAL, SURGICAL, FAMILY AND  SOCIAL HISTORY: Reviewed in Epic and signed.  VITALS: Today's Vitals   09/08/21 1508  BP: 132/68  Pulse: 80  Resp: 18  Temp: 98.2 F (36.8 C)  SpO2: 98%  Weight: 165 lb 3.2 oz (74.9 kg)  Height: 5\' 2"  (1.575 m)   Body mass index is 30.22 kg/m.   PHYSICAL EXAM: Physical Exam Constitutional:      Appearance: Normal appearance.  HENT:     Head: Normocephalic and atraumatic.     Mouth/Throat:     Mouth: Mucous membranes are moist.     Pharynx: Oropharynx is clear.  Eyes:     Conjunctiva/sclera: Conjunctivae normal.     Pupils: Pupils are equal, round, and reactive to light.  Cardiovascular:     Rate and Rhythm: Normal rate and regular rhythm.  Pulmonary:     Effort: Pulmonary effort is normal.     Breath sounds: Normal breath sounds.  Abdominal:     General: There is no distension.     Palpations: Abdomen is soft.     Tenderness: There is no abdominal tenderness.  Musculoskeletal:        General: No tenderness.     Right lower leg: Edema present.     Left lower leg: Edema present.     Comments: 1+ pitting edema bilaterally, R>L  Skin:    General: Skin is warm and dry.  Neurological:     General: No focal deficit present.     Mental Status: She is alert. Mental status is at baseline.  Psychiatric:        Mood and Affect: Mood  normal.        Behavior: Behavior normal.    Assessment & Plan:  1. Need for influenza vaccination: Administered today.   - Flu Vaccine QUAD High Dose(Fluad)  2. Hypothyroidism, unspecified type: Stable. Recheck TSH, doing well on current dose of Levothyroxine 1125 mcg.  - TSH  3. Hypertensive renal disease with renal failure, 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 unspecified chronic kidney disease: Recheck CMP, following with Nephrology in 2 weeks.   - COMPLETE METABOLIC PANEL WITH GFR  4. Mixed hyperlipidemia: Stable, recheck lipid panel, doing well on statin.   - Lipid Profile  5. Anemia due to stage 3a chronic kidney disease (HCC): Stable, recheck  CBC.   - CBC w/Diff/Platelet  6. Prediabetes: Last A1c in April 5.6, recheck with above labs today.   - COMPLETE METABOLIC PANEL WITH GFR - HgB Q7Y  7. Osteoporosis screening/Hyperparathyroidism, secondary renal : Counseled on DEXA scan, which patient politely refuses. Check Vitamin D level, in meantime she will continue 1000 IU daily.   - Vitamin D (25 hydroxy)   Return in about 6 months (around 03/09/2022) for FU on chronic medical conditions.

## 2021-09-08 NOTE — Patient Instructions (Signed)
Blood work today Continue to follow with Nephrology Call with any questions or concerns Follow up in 6 months or sooner as needed

## 2021-09-09 LAB — LIPID PANEL
Cholesterol: 141 mg/dL (ref <200)
HDL: 49 mg/dL — ABNORMAL LOW (ref >=50)
LDL Cholesterol (Calc): 68 mg/dL (calc)
Non-HDL Cholesterol (Calc): 92 mg/dL (calc) (ref <130)
Total CHOL/HDL Ratio: 2.9 (calc) (ref <5.0)
Triglycerides: 158 mg/dL — ABNORMAL HIGH (ref <150)

## 2021-09-09 LAB — CBC WITH DIFFERENTIAL/PLATELET
Absolute Monocytes: 589 cells/uL (ref 200–950)
Basophils Absolute: 100 cells/uL (ref 0–200)
Basophils Relative: 1.2 %
Eosinophils Absolute: 66 cells/uL (ref 15–500)
Eosinophils Relative: 0.8 %
HCT: 37.6 % (ref 35.0–45.0)
Hemoglobin: 12.4 g/dL (ref 11.7–15.5)
Lymphs Abs: 1469 cells/uL (ref 850–3900)
MCH: 29.5 pg (ref 27.0–33.0)
MCHC: 33 g/dL (ref 32.0–36.0)
MCV: 89.5 fL (ref 80.0–100.0)
MPV: 11.3 fL (ref 7.5–12.5)
Monocytes Relative: 7.1 %
Neutro Abs: 6076 cells/uL (ref 1500–7800)
Neutrophils Relative %: 73.2 %
Platelets: 225 10*3/uL (ref 140–400)
RBC: 4.2 10*6/uL (ref 3.80–5.10)
RDW: 12.9 % (ref 11.0–15.0)
Total Lymphocyte: 17.7 %
WBC: 8.3 10*3/uL (ref 3.8–10.8)

## 2021-09-09 LAB — COMPLETE METABOLIC PANEL WITH GFR
AG Ratio: 1.5 (calc) (ref 1.0–2.5)
ALT: 11 U/L (ref 6–29)
AST: 21 U/L (ref 10–35)
Albumin: 4.1 g/dL (ref 3.6–5.1)
Alkaline phosphatase (APISO): 81 U/L (ref 37–153)
BUN/Creatinine Ratio: 17 (calc) (ref 6–22)
BUN: 25 mg/dL (ref 7–25)
CO2: 23 mmol/L (ref 20–32)
Calcium: 9.5 mg/dL (ref 8.6–10.4)
Chloride: 103 mmol/L (ref 98–110)
Creat: 1.44 mg/dL — ABNORMAL HIGH (ref 0.60–0.95)
Globulin: 2.8 g/dL (calc) (ref 1.9–3.7)
Glucose, Bld: 98 mg/dL (ref 65–99)
Potassium: 4.5 mmol/L (ref 3.5–5.3)
Sodium: 137 mmol/L (ref 135–146)
Total Bilirubin: 0.4 mg/dL (ref 0.2–1.2)
Total Protein: 6.9 g/dL (ref 6.1–8.1)
eGFR: 37 mL/min/{1.73_m2} — ABNORMAL LOW (ref >=60)

## 2021-09-09 LAB — HEMOGLOBIN A1C
Hgb A1c MFr Bld: 5.5 % of total Hgb (ref <5.7)
Mean Plasma Glucose: 111 mg/dL
eAG (mmol/L): 6.2 mmol/L

## 2021-09-09 LAB — TSH: TSH: 1.37 mIU/L (ref 0.40–4.50)

## 2021-09-09 LAB — VITAMIN D 25 HYDROXY (VIT D DEFICIENCY, FRACTURES): Vit D, 25-Hydroxy: 32 ng/mL (ref 30–100)

## 2021-09-25 DIAGNOSIS — N1832 Chronic kidney disease, stage 3b: Secondary | ICD-10-CM | POA: Diagnosis not present

## 2021-09-25 DIAGNOSIS — N2581 Secondary hyperparathyroidism of renal origin: Secondary | ICD-10-CM | POA: Diagnosis not present

## 2021-09-25 DIAGNOSIS — I129 Hypertensive chronic kidney disease with stage 1 through stage 4 chronic kidney disease, or unspecified chronic kidney disease: Secondary | ICD-10-CM | POA: Diagnosis not present

## 2021-09-25 DIAGNOSIS — R809 Proteinuria, unspecified: Secondary | ICD-10-CM | POA: Diagnosis not present

## 2021-09-26 ENCOUNTER — Ambulatory Visit: Payer: Medicare Other | Admitting: Family Medicine

## 2021-10-02 DIAGNOSIS — N1832 Chronic kidney disease, stage 3b: Secondary | ICD-10-CM | POA: Diagnosis not present

## 2021-10-02 DIAGNOSIS — N2581 Secondary hyperparathyroidism of renal origin: Secondary | ICD-10-CM | POA: Diagnosis not present

## 2021-10-02 DIAGNOSIS — I129 Hypertensive chronic kidney disease with stage 1 through stage 4 chronic kidney disease, or unspecified chronic kidney disease: Secondary | ICD-10-CM | POA: Diagnosis not present

## 2021-10-02 DIAGNOSIS — R809 Proteinuria, unspecified: Secondary | ICD-10-CM | POA: Diagnosis not present

## 2021-10-02 DIAGNOSIS — D631 Anemia in chronic kidney disease: Secondary | ICD-10-CM | POA: Diagnosis not present

## 2021-10-05 ENCOUNTER — Ambulatory Visit: Payer: Medicare Other

## 2021-11-02 ENCOUNTER — Ambulatory Visit (INDEPENDENT_AMBULATORY_CARE_PROVIDER_SITE_OTHER): Payer: Medicare Other | Admitting: Podiatry

## 2021-11-02 ENCOUNTER — Other Ambulatory Visit: Payer: Self-pay

## 2021-11-02 ENCOUNTER — Encounter: Payer: Self-pay | Admitting: Podiatry

## 2021-11-02 DIAGNOSIS — I739 Peripheral vascular disease, unspecified: Secondary | ICD-10-CM

## 2021-11-02 DIAGNOSIS — M79675 Pain in left toe(s): Secondary | ICD-10-CM | POA: Diagnosis not present

## 2021-11-02 DIAGNOSIS — B351 Tinea unguium: Secondary | ICD-10-CM

## 2021-11-02 DIAGNOSIS — M79674 Pain in right toe(s): Secondary | ICD-10-CM

## 2021-11-02 DIAGNOSIS — N1832 Chronic kidney disease, stage 3b: Secondary | ICD-10-CM

## 2021-11-02 NOTE — Progress Notes (Addendum)
This patient returns to my office for at risk foot care.  This patient requires this care by a professional since this patient will be at risk due to having chronic kidney disease  This patient is unable to cut nails herself since the patient cannot reach her nails.These nails are painful walking and wearing shoes. Patient had left great toenail removed last visit and her toe is doing well. This patient presents for at risk foot care today.  General Appearance  Alert, conversant and in no acute stress.  Vascular  Dorsalis pedis  are palpable  Bilaterally.  Posterior tibial pulses are weakly palpable  B/L  Capillary return is within normal limits  Bilaterally.  Cold feet.  Bilaterally.  Absent digital hairs.  Neurologic  Senn-Weinstein monofilament wire test within normal limits  bilaterally. Muscle power within normal limits bilaterally.  Nails Thick disfigured discolored nails with subungual debris  from hallux to fifth toes bilaterally. No evidence of bacterial infection or drainage bilaterally. Her third toenail is self avulsing and the nail plate is unattached left foot.  Healing proximal third toenail right foot. No drainage or infection.  Orthopedic  No limitations of motion  feet .  No crepitus or effusions noted.  No bony pathology or digital deformities noted.  Skin  normotropic skin with no porokeratosis noted bilaterally.  No signs of infections or ulcers noted.     Onychomycosis  Pain in right toes  Pain in left toes  Consent was obtained for treatment procedures.   Mechanical debridement of nails 1-5  bilaterally performed with a nail nipper.  Filed with dremel without incident.      Return office visit  10 weeks                   Told patient to return for periodic foot care and evaluation due to potential at risk complications.   Helane Gunther DPM

## 2021-11-09 ENCOUNTER — Ambulatory Visit (INDEPENDENT_AMBULATORY_CARE_PROVIDER_SITE_OTHER): Payer: Medicare Other

## 2021-11-09 DIAGNOSIS — Z0000 Encounter for general adult medical examination without abnormal findings: Secondary | ICD-10-CM | POA: Diagnosis not present

## 2021-11-09 NOTE — Patient Instructions (Signed)
Janice [name] , Thank you for taking time to come for your Medicare Wellness Visit. I appreciate your ongoing commitment to your health goals. Please review the following plan we discussed and let me know if I can assist you in the future.   Screening recommendations/referrals: Colonoscopy: no longer required Mammogram: no longer required Bone Density: no longer required Recommended yearly ophthalmology/optometry visit for glaucoma screening and checkup Recommended yearly dental visit for hygiene and checkup  Vaccinations: Influenza vaccine: don 09/08/21 Pneumococcal vaccine: done 11/15/14 Tdap vaccine: due Shingles vaccine: Shingrix discussed. Please contact your pharmacy for coverage information.  Covid-19: declined  Advanced directives: Please bring a copy of your health care power of attorney and living will to the office at your convenience.   Conditions/risks identified: Keep up the great work!  Next appointment: Follow up in one year for your annual wellness visit    Preventive Care 65 Years and Older, Female Preventive care refers to lifestyle choices and visits with your health care provider that can promote health and wellness. What does preventive care include? A yearly physical exam. This is also called an annual well check. Dental exams once or twice a year. Routine eye exams. Ask your health care provider how often you should have your eyes checked. Personal lifestyle choices, including: Daily care of your teeth and gums. Regular physical activity. Eating a healthy diet. Avoiding tobacco and drug use. Limiting alcohol use. Practicing safe sex. Taking low-dose aspirin every day. Taking vitamin and mineral supplements as recommended by your health care provider. What happens during an annual well check? The services and screenings done by your health care provider during your annual well check will depend on your age, overall health, lifestyle risk factors, and family  history of disease. Counseling  Your health care provider may ask you questions about your: Alcohol use. Tobacco use. Drug use. Emotional well-being. Home and relationship well-being. Sexual activity. Eating habits. History of falls. Memory and ability to understand (cognition). Work and work Astronomer. Reproductive health. Screening  You may have the following tests or measurements: Height, weight, and BMI. Blood pressure. Lipid and cholesterol levels. These may be checked every 5 years, or more frequently if you are over 28 years old. Skin check. Lung cancer screening. You may have this screening every year starting at age 18 if you have a 30-pack-year history of smoking and currently smoke or have quit within the past 15 years. Fecal occult blood test (FOBT) of the stool. You may have this test every year starting at age 31. Flexible sigmoidoscopy or colonoscopy. You may have a sigmoidoscopy every 5 years or a colonoscopy every 10 years starting at age 34. Hepatitis C blood test. Hepatitis B blood test. Sexually transmitted disease (STD) testing. Diabetes screening. This is done by checking your blood sugar (glucose) after you have not eaten for a while (fasting). You may have this done every 1-3 years. Bone density scan. This is done to screen for osteoporosis. You may have this done starting at age 4. Mammogram. This may be done every 1-2 years. Talk to your health care provider about how often you should have regular mammograms. Talk with your health care provider about your test results, treatment options, and if necessary, the need for more tests. Vaccines  Your health care provider may recommend certain vaccines, such as: Influenza vaccine. This is recommended every year. Tetanus, diphtheria, and acellular pertussis (Tdap, Td) vaccine. You may need a Td booster every 10 years. Zoster vaccine. You may  need this after age 26. Pneumococcal 13-valent conjugate (PCV13)  vaccine. One dose is recommended after age 81. Pneumococcal polysaccharide (PPSV23) vaccine. One dose is recommended after age 63. Talk to your health care provider about which screenings and vaccines you need and how often you need them. This information is not intended to replace advice given to you by your health care provider. Make sure you discuss any questions you have with your health care provider. Document Released: 12/16/2015 Document Revised: 08/08/2016 Document Reviewed: 09/20/2015 Elsevier Interactive Patient Education  2017 [place] Prevention in the Home Falls can cause injuries. They can happen to people of all ages. There are many things you can do to make your home safe and to help prevent falls. What can I do on the outside of my home? Regularly fix the edges of walkways and driveways and fix any cracks. Remove anything that might make you trip as you walk through a door, such as a raised step or threshold. Trim any bushes or trees on the path to your home. Use bright outdoor lighting. Clear any walking paths of anything that might make someone trip, such as rocks or tools. Regularly check to see if handrails are loose or broken. Make sure that both sides of any steps have handrails. Any raised decks and porches should have guardrails on the edges. Have any leaves, snow, or ice cleared regularly. Use sand or salt on walking paths during winter. Clean up any spills in your garage right away. This includes oil or grease spills. What can I do in the bathroom? Use night lights. Install grab bars by the toilet and in the tub and shower. Do not use towel bars as grab bars. Use non-skid mats or decals in the tub or shower. If you need to sit down in the shower, use a plastic, non-slip stool. Keep the floor dry. Clean up any water that spills on the floor as soon as it happens. Remove soap buildup in the tub or shower regularly. Attach bath mats securely with  double-sided non-slip rug tape. Do not have throw rugs and other things on the floor that can make you trip. What can I do in the bedroom? Use night lights. Make sure that you have a light by your bed that is easy to reach. Do not use any sheets or blankets that are too big for your bed. They should not hang down onto the floor. Have a firm chair that has side arms. You can use this for support while you get dressed. Do not have throw rugs and other things on the floor that can make you trip. What can I do in the kitchen? Clean up any spills right away. Avoid walking on wet floors. Keep items that you use a lot in easy-to-reach places. If you need to reach something above you, use a strong step stool that has a grab bar. Keep electrical cords out of the way. Do not use floor polish or wax that makes floors slippery. If you must use wax, use non-skid floor wax. Do not have throw rugs and other things on the floor that can make you trip. What can I do with my stairs? Do not leave any items on the stairs. Make sure that there are handrails on both sides of the stairs and use them. Fix handrails that are broken or loose. Make sure that handrails are as long as the stairways. Check any carpeting to make sure that it is firmly attached  to the stairs. Fix any carpet that is loose or worn. Avoid having throw rugs at the top or bottom of the stairs. If you do have throw rugs, attach them to the floor with carpet tape. Make sure that you have a light switch at the top of the stairs and the bottom of the stairs. If you do not have them, ask someone to add them for you. What else can I do to help prevent falls? Wear shoes that: Do not have high heels. Have rubber bottoms. Are comfortable and fit you well. Are closed at the toe. Do not wear sandals. If you use a stepladder: Make sure that it is fully opened. Do not climb a closed stepladder. Make sure that both sides of the stepladder are locked  into place. Ask someone to hold it for you, if possible. Clearly mark and make sure that you can see: Any grab bars or handrails. First and last steps. Where the edge of each step is. Use tools that help you move around (mobility aids) if they are needed. These include: Canes. Walkers. Scooters. Crutches. Turn on the lights when you go into a dark area. Replace any light bulbs as soon as they burn out. Set up your furniture so you have a clear path. Avoid moving your furniture around. If any of your floors are uneven, fix them. If there are any pets around you, be aware of where they are. Review your medicines with your doctor. Some medicines can make you feel dizzy. This can increase your chance of falling. Ask your doctor what other things that you can do to help prevent falls. This information is not intended to replace advice given to you by your health care provider. Make sure you discuss any questions you have with your health care provider. Document Released: 09/15/2009 Document Revised: 04/26/2016 Document Reviewed: 12/24/2014 Elsevier Interactive Patient Education  2017 [employer].

## 2021-11-09 NOTE — Progress Notes (Signed)
Subjective:   Janice [name] is a 80 y.o. female who presents for Medicare Annual (Subsequent) preventive examination.  Virtual Visit via Telephone Note  I connected with  Janice [name] on 11/09/21 at 10:40 AM EST by telephone and verified that I am speaking with the correct person using two identifiers.  Location: Patient: home Provider: [place] Persons participating in the virtual visit: [place]   I discussed the limitations, risks, security and privacy concerns of performing an evaluation and management service by telephone and the availability of in person appointments. The patient expressed understanding and agreed to proceed.  Interactive audio and video telecommunications were attempted between this nurse and patient, however failed, due to patient having technical difficulties OR patient did not have access to video capability.  We continued and completed visit with audio only.  Some vital signs may be absent or patient reported.   [name], LPN   Review of Systems     Cardiac Risk Factors include: advanced age (>26men, >32 women);dyslipidemia;hypertension     Objective:    There were no vitals filed for this visit. There is no height or weight on file to calculate BMI.  Advanced Directives 11/09/2021 03/16/2021 10/04/2020 08/18/2019 08/15/2018 10/10/2017 09/28/2017  Does Patient Have a Medical Advance Directive? No No Yes No Yes No No  Type of Advance Directive - - Out of facility DNR (pink MOST or yellow form) - Press photographer;Living will - -  Copy of [place] in Chart? - - - - No - copy requested - -  Would patient like information on creating a medical advance directive? No - Patient declined No - Patient declined - Yes (MAU/Ambulatory/Procedural Areas - Information given) - No - Patient declined No - Patient declined    Current Medications (verified) Outpatient Encounter Medications as of 11/09/2021  Medication  Sig   amLODipine (NORVASC) 5 MG tablet Take 1 tablet by mouth daily.   aspirin EC 81 MG tablet Take 81 mg daily by mouth.   calcitRIOL (ROCALTROL) 0.25 MCG capsule Take by mouth. Take 1 capsule (0.25 mcg total) by mouth 3 (three) times weekly on Monday Wednesday Saturday   FARXIGA 10 MG TABS tablet Take 1 tablet by mouth daily.   levothyroxine (SYNTHROID) 112 MCG tablet Take 1 tablet (112 mcg total) by mouth daily.   losartan (COZAAR) 100 MG tablet Take 1 tablet (100 mg total) by mouth daily.   metoprolol tartrate (LOPRESSOR) 25 MG tablet Take 1 tablet (25 mg total) by mouth 2 (two) times daily.   rosuvastatin (CRESTOR) 20 MG tablet TAKE 1 TABLET BY MOUTH EVERYDAY AT BEDTIME   [DISCONTINUED] amLODipine (NORVASC) 10 MG tablet Take 1 tablet (10 mg total) by mouth daily.   [DISCONTINUED] Cholecalciferol (VITAMIN D3) 1000 UNITS CAPS Take 1,000 Int'l Units by mouth daily.   No facility-administered encounter medications on file as of 11/09/2021.    Allergies (verified) Patient has no known allergies.   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   INSERTION OF MESH N/A 03/05/2016   Procedure: INSERTION OF MESH;  Surgeon: Robert Bellow, MD;  Location: [facility] ORS;  Service: General;  Laterality: N/A;   VENTRAL HERNIA REPAIR N/A 03/05/2016   Procedure: HERNIA REPAIR VENTRAL ADULT;  Surgeon: Robert Bellow, MD;  Location: [facility] ORS;  Service: General;  Laterality: N/A;   Family History   Problem Relation Age of Onset   Heart disease Mother    Hypertension Mother    Heart disease Father    Hypertension Father    Hypertension Sister    Diabetes Sister    Cancer Sister        lung   COPD Neg Hx    Stroke Neg Hx    Social History   Socioeconomic History   Marital status: Widowed    Spouse name: Not on file   Number of children: 4   Years of education: some college   Highest education level: 12th grade  Occupational History   Occupation: Retired  Tobacco Use   Smoking status: Former    Packs/day: 2.00    Years: 50.00    Pack years: 100.00    Types: Cigarettes    Quit date: 12/03/2008    Years since quitting: 12.9   Smokeless tobacco: Never   Tobacco comments:    smoking cessation materials not required  Vaping Use   Vaping Use: Never used  Substance and Sexual Activity   Alcohol use: No    Alcohol/week: 0.0 standard drinks   Drug use: No   Sexual activity: Not Currently  Other Topics Concern   Not on file  Social History Narrative   Pt lives with her daughter   Social Determinants of Health   Financial Resource Strain: Low Risk    Difficulty of Paying Living Expenses: Not hard at all  Food Insecurity: No Food Insecurity   Worried About Charity fundraiser in the Last Year: Never true   Arboriculturist in the Last Year: Never true  Transportation Needs: No Transportation Needs   Lack of Transportation (Medical): No   Lack of Transportation (Non-Medical): No  Physical Activity: Insufficiently Active   Days of Exercise per Week: 7 days   Minutes of Exercise per Session: 20 min  Stress: No Stress Concern Present   Feeling of Stress : Not at all  Social Connections: Socially Isolated   Frequency of Communication with Friends and Family: More than three times a week   Frequency of Social Gatherings with Friends and Family: More than three times a week   Attends Religious Services: Never   Marine scientist or Organizations: No   Attends English as a second language teacher Meetings: Never   Marital Status: Widowed    Tobacco Counseling Counseling given: Not Answered Tobacco comments: smoking cessation materials not required   Clinical Intake:  Pre-visit preparation completed: Yes  Pain : No/denies pain     Nutritional Risks: None Diabetes: No  How often do you need to have someone help you when you read instructions, pamphlets, or other written materials from your doctor or pharmacy?: 1 - Never    Interpreter Needed?: No  Information entered by :: [name] LPN   Activities of Daily Living In your present state of health, do you have any difficulty performing the following activities: 11/09/2021 09/08/2021  Hearing? N N  Vision? N N  Difficulty concentrating or making decisions? N N  Walking or climbing stairs? N N  Dressing or bathing? N N  Doing errands, shopping? N N  Preparing Food and eating ? N -  Using the Toilet? N -  In the past six months, have you accidently leaked urine? N -  Do you have problems with loss of bowel control? N -  Managing your Medications? N -  Managing your Finances? N -  Housekeeping or managing your Housekeeping? N -  Some recent data might be hidden    Patient Care Team: Teodora Medici, DO as PCP - General (Internal Medicine) Lavonia Dana, MD as Consulting Physician (Internal Medicine) Gardiner Barefoot, DPM as Consulting Physician (Podiatry)  Indicate any recent Medical Services you may have received from other than [facility] providers in the past year (date may be approximate).     Assessment:   This is a routine wellness examination for [place].  Hearing/Vision screen Hearing Screening - Comments:: Pt denies hearing difficulty Vision Screening - Comments:: Past due for eye exam, not established with provider, declined referral  Dietary issues and exercise activities discussed: Current Exercise Habits: Home exercise routine, Type of exercise: walking, Time (Minutes): 20,  Frequency (Times/Week): 7, Weekly Exercise (Minutes/Week): 140, Intensity: Mild, Exercise limited by: None identified   Goals Addressed             This Visit's Progress    diet   On track    Recommend eating 3 small healthy meals and at least 2 healthy snacks per day       Depression Screen PHQ 2/9 Scores 11/09/2021 09/08/2021 03/03/2021 10/04/2020 09/02/2020 03/04/2020 09/04/2019  PHQ - 2 Score 0 0 0 0 0 0 0  PHQ- 9 Score - 0 - - - 0 0    Fall Risk Fall Risk  11/09/2021 09/08/2021 03/03/2021 10/04/2020 09/02/2020  Falls in the past year? 0 0 0 0 0  Number falls in past yr: 0 0 0 0 0  Injury with Fall? 0 0 0 0 0  Risk for fall due to : No Fall Risks - - No Fall Risks -  Risk for fall due to: Comment - - - - -  Follow up Falls prevention discussed - Falls evaluation completed Falls prevention discussed Falls evaluation completed    [place]:  Any stairs in or around the home? Yes  If so, are there any without handrails? No  Home free of loose throw rugs in walkways, pet beds, electrical cords, etc? Yes  Adequate lighting in your home to reduce risk of falls? Yes   ASSISTIVE DEVICES UTILIZED TO PREVENT FALLS:  Life alert? No  Use of a cane, walker or w/c? No  Grab bars in the bathroom? Yes  Shower chair or bench in shower? No  Elevated toilet seat or a handicapped toilet? No   TIMED UP AND GO:  Was the test performed? No . Telephonic visit.   Cognitive Function: Normal cognitive status assessed by direct observation by this Nurse Health Advisor. No abnormalities found.       6CIT Screen 10/04/2020 08/18/2019 08/15/2018  What Year? 0 points 0 points 0 points  What month? 0 points 0 points 0 points  What time? 0 points 0 points 3 points  Count back from 20 0 points 0 points 0 points  Months in reverse 0 points 0 points 0 points  Repeat phrase 0 points 0 points 4 points  Total Score 0  0 7    Immunizations Immunization History  Administered  Date(s) Administered   Fluad Quad(high Dose 65+) 09/04/2019, 09/02/2020, 09/08/2021   Influenza, High Dose Seasonal PF 10/08/2017, 08/15/2018   Influenza,inj,Quad PF,6+ Mos 10/17/2016   Influenza-Unspecified 11/15/2014   Pneumococcal Conjugate-13 11/15/2014   Pneumococcal Polysaccharide-23 09/04/2019   Td 12/03/2008    TDAP status: Due, Education has been provided regarding the importance of this vaccine. Advised may receive this vaccine at local pharmacy or Health Dept. Aware to provide a copy of the vaccination record if obtained from local pharmacy or Health Dept. Verbalized acceptance and understanding.  Flu Vaccine status: Up to date  Pneumococcal vaccine status: Up to date  Covid-19 vaccine status: Declined, Education has been provided regarding the importance of this vaccine but patient still declined. Advised may receive this vaccine at local pharmacy or Health Dept.or vaccine clinic. Aware to provide a copy of the vaccination record if obtained from local pharmacy or Health Dept. Verbalized acceptance and understanding.  Qualifies for Shingles Vaccine? Yes   Zostavax completed No   Shingrix Completed?: No.    Education has been provided regarding the importance of this vaccine. Patient has been advised to call insurance company to determine out of pocket expense if they have not yet received this vaccine. Advised may also receive vaccine at local pharmacy or Health Dept. Verbalized acceptance and understanding.  Screening Tests Health Maintenance  Topic Date Due   Zoster Vaccines- Shingrix (1 of 2) Never done   DEXA SCAN  Never done   COVID-19 Vaccine (1) 11/25/2021 (Originally 03/19/1942)   TETANUS/TDAP  09/08/2022 (Originally 12/03/2018)   Pneumonia Vaccine 15+ Years old  Completed   INFLUENZA VACCINE  Completed   HPV VACCINES  Aged Out    Health Maintenance  Health Maintenance Due  Topic Date Due   Zoster Vaccines- Shingrix (1 of 2) Never done   DEXA SCAN  Never done     Colorectal cancer screening: No longer required.   Mammogram status: No longer required due to age.  Bone density status: no longer required  Lung Cancer Screening: (Low Dose CT Chest recommended if Age 54-80 years, 30 pack-year currently smoking OR have quit w/in 15years.) does not qualify.   Additional Screening:  Hepatitis C Screening: does not qualify  Vision Screening: Recommended annual ophthalmology exams for early detection of glaucoma and other disorders of the eye. Is the patient up to date with their annual eye exam?  No  Who is the provider or what is the name of the office in which the patient attends annual eye exams? Not established If pt is not established with a provider, would they like to be referred to a provider to establish care? No  - declined  Dental Screening: Recommended annual dental exams for proper oral hygiene  Community Resource Referral / Chronic Care Management: CRR required this visit?  No   CCM required this visit?  No      Plan:     I have personally reviewed and noted the following in the patient's chart:   Medical and social history Use of alcohol, tobacco or illicit drugs  Current medications and supplements including opioid prescriptions.  Functional ability and status Nutritional status Physical activity Advanced directives List of other physicians Hospitalizations, surgeries, and ER visits in previous 12 months Vitals Screenings to include cognitive, depression, and falls Referrals and appointments  In addition, I have reviewed and discussed with patient certain preventive protocols, quality metrics, and best practice recommendations.  A written personalized care plan for preventive services as well as general preventive health recommendations were provided to patient.     [name], LPN   [id number]   Nurse Notes: none

## 2022-01-18 ENCOUNTER — Other Ambulatory Visit: Payer: Self-pay

## 2022-01-18 ENCOUNTER — Encounter: Payer: Self-pay | Admitting: Podiatry

## 2022-01-18 ENCOUNTER — Ambulatory Visit (INDEPENDENT_AMBULATORY_CARE_PROVIDER_SITE_OTHER): Payer: Medicare Other | Admitting: Podiatry

## 2022-01-18 DIAGNOSIS — M79674 Pain in right toe(s): Secondary | ICD-10-CM | POA: Diagnosis not present

## 2022-01-18 DIAGNOSIS — B351 Tinea unguium: Secondary | ICD-10-CM | POA: Diagnosis not present

## 2022-01-18 DIAGNOSIS — M79675 Pain in left toe(s): Secondary | ICD-10-CM

## 2022-01-18 DIAGNOSIS — N1832 Chronic kidney disease, stage 3b: Secondary | ICD-10-CM

## 2022-01-18 DIAGNOSIS — I739 Peripheral vascular disease, unspecified: Secondary | ICD-10-CM

## 2022-01-18 NOTE — Progress Notes (Signed)
This patient returns to my office for at risk foot care.  This patient requires this care by a professional since this patient will be at risk due to having chronic kidney disease  This patient is unable to cut nails herself since the patient cannot reach her nails.These nails are painful walking and wearing shoes. Patient had left great toenail removed last visit and her toe is doing well. This patient presents for at risk foot care today.  General Appearance  Alert, conversant and in no acute stress.  Vascular  Dorsalis pedis  are palpable  Bilaterally.  Posterior tibial pulses are weakly palpable  B/L  Capillary return is within normal limits  Bilaterally.  Cold feet.  Bilaterally.  Absent digital hairs.  Neurologic  Senn-Weinstein monofilament wire test within normal limits  bilaterally. Muscle power within normal limits bilaterally.  Nails Thick disfigured discolored nails with subungual debris  from hallux to fifth toes bilaterally. No evidence of bacterial infection or drainage bilaterally. Her third toenail is self avulsing and the nail plate is unattached left foot.  Healing proximal third toenail right foot. No drainage or infection.  Orthopedic  No limitations of motion  feet .  No crepitus or effusions noted.  No bony pathology or digital deformities noted.  Skin  normotropic skin with no porokeratosis noted bilaterally.  No signs of infections or ulcers noted.     Onychomycosis  Pain in right toes  Pain in left toes  Consent was obtained for treatment procedures.   Mechanical debridement of nails 1-5  bilaterally performed with a nail nipper.  Filed with dremel without incident.      Return office visit  10 weeks                   Told patient to return for periodic foot care and evaluation due to potential at risk complications.   Lilibeth Opie DPM  

## 2022-03-13 ENCOUNTER — Ambulatory Visit (INDEPENDENT_AMBULATORY_CARE_PROVIDER_SITE_OTHER): Payer: Medicare Other | Admitting: Internal Medicine

## 2022-03-13 ENCOUNTER — Encounter: Payer: Self-pay | Admitting: Internal Medicine

## 2022-03-13 VITALS — BP 124/78 | HR 74 | Temp 97.5°F | Resp 16 | Ht 62.0 in | Wt 169.4 lb

## 2022-03-13 DIAGNOSIS — E039 Hypothyroidism, unspecified: Secondary | ICD-10-CM

## 2022-03-13 DIAGNOSIS — R7303 Prediabetes: Secondary | ICD-10-CM

## 2022-03-13 DIAGNOSIS — E782 Mixed hyperlipidemia: Secondary | ICD-10-CM

## 2022-03-13 DIAGNOSIS — N1832 Chronic kidney disease, stage 3b: Secondary | ICD-10-CM

## 2022-03-13 DIAGNOSIS — I129 Hypertensive chronic kidney disease with stage 1 through stage 4 chronic kidney disease, or unspecified chronic kidney disease: Secondary | ICD-10-CM | POA: Diagnosis not present

## 2022-03-13 MED ORDER — METOPROLOL TARTRATE 25 MG PO TABS: 25.0000 mg | ORAL_TABLET | Freq: Two times a day (BID) | ORAL | 3 refills | Status: DC

## 2022-03-13 MED ORDER — ROSUVASTATIN CALCIUM 20 MG PO TABS: ORAL_TABLET | ORAL | 3 refills | Status: DC

## 2022-03-13 MED ORDER — LOSARTAN POTASSIUM 100 MG PO TABS: 100.0000 mg | ORAL_TABLET | Freq: Every day | ORAL | 3 refills | Status: DC

## 2022-03-13 NOTE — Assessment & Plan Note (Signed)
Following with Nephrology 

## 2022-03-13 NOTE — Assessment & Plan Note (Signed)
Stable, continue Levothyroxine 112 mcg, plan to recheck at follow up. ?

## 2022-03-13 NOTE — Assessment & Plan Note (Addendum)
Stable, blood pressure well controlled. Continue Losartan 100 mg, Norvasc 5 mg and Metoprolol 25 mg BID and continue to check BP at home. Refills today. ?

## 2022-03-13 NOTE — Assessment & Plan Note (Addendum)
Reviewed last lipid panel, doing well, continue Crestor 20. ?

## 2022-03-13 NOTE — Assessment & Plan Note (Signed)
Last A1c well controlled, continue to monitor.  ?

## 2022-03-13 NOTE — Progress Notes (Signed)
Janice [name] is a 81 y.o. female that presents today for Follow-up, Hypertension, Hyperlipidemia, and Hypothyroidism ?. ? ?Chronic medical conditions include: HTN, CKD3, hypothyroidism, HLD, pre-diabetes ? ?HPI: ?Janice [name] is a 81 year old female here for 6 month follow up. Overall she is doing well and has no questions or concerns today.  ? ?Hypertension ?-Medications: Losartan 100 mg, Norvasc 5 mg (decreased by Nephrology), Metoprolol 25 mg BID ?-She is compliant with above medications and reports no side effects ?-Checking BP at home: average about 129-130/80 ?-Denies any SOB, CP, vision changes or symptoms of hypotension ?-Does endorse chronic BLE swelling, R>L since vascular surgery ? ?HLD ?-Medications: Crestor 20 mg ?-She is compliant with above medications and reports no side effects ?-Last lipid panel Lipid Panel  ?   ?Component Value Date/Time  ? CHOL 141 09/08/2021 1540  ? CHOL 147 11/21/2015 1001  ? TRIG 158 (H) 09/08/2021 1540  ? HDL 49 (L) 09/08/2021 1540  ? HDL 51 11/21/2015 1001  ? CHOLHDL 2.9 09/08/2021 1540  ? VLDL 34 (H) 11/21/2016 1006  ? [place] 68 09/08/2021 1540  ? LABVLDL 25 11/21/2015 1001  ? ?Pre-Diabetes: ?-Last A1c 10/22 5.5% ?-Not currently on any medication ? ?CKD3 ?-Currently following with Nephrology, sees them every 6 months as well  ?-Last Cr 10/22 1.44, baseline ~ 1.1-1.2 ?-Currently taking Farxiga per [facility]rho, reports no side effects ? ?Hypothyroidism ?-Medications: Levothyroxine 112 mcg  ?-Last TSH 10/22 1.37 ?-Doing well since increase in dose, denies anxiety, weight loss, lack of appetite, palpitations, skin changes ? ?Vitamin D Deficiency: ?-Not interested in pursuing DEXA  ?-Currently on Calcitrol per Nephrology  ? ?Review of Systems  ?Constitutional:  Negative for appetite change, chills, fever and unexpected weight change.  ?Eyes:  Negative for visual disturbance.  ?Respiratory:  Negative for cough and shortness of breath.   ?Cardiovascular:  Positive for leg swelling.  Negative for chest pain and palpitations.  ?Gastrointestinal:  Negative for abdominal pain, blood in stool, constipation and diarrhea.  ?Endocrine: Negative for cold intolerance and heat intolerance.  ?Genitourinary:  Negative for dysuria and hematuria.  ?Skin: Negative.   ? ?PAST MEDICAL, SURGICAL, FAMILY AND SOCIAL HISTORY: Reviewed in Epic and signed. ? ?VITALS: ?Today's Vitals  ? 03/13/22 1424  ?BP: 124/78  ?Pulse: 74  ?Resp: 16  ?Temp: (!) 97.5 ?F (36.4 ?C)  ?SpO2: 99%  ?Weight: 169 lb 6.4 oz (76.8 kg)  ?Height: 5\' 2"  (1.575 m)  ? ?Body mass index is 30.98 kg/m?. ? ? ?PHYSICAL EXAM: ?Physical Exam ?Constitutional:   ?   Appearance: Normal appearance.  ?HENT:  ?   Head: Normocephalic and atraumatic.  ?   Mouth/Throat:  ?   Mouth: Mucous membranes are moist.  ?   Pharynx: Oropharynx is clear.  ?Eyes:  ?   Conjunctiva/sclera: Conjunctivae normal.  ?   Pupils: Pupils are equal, round, and reactive to light.  ?Cardiovascular:  ?   Rate and Rhythm: Normal rate and regular rhythm.  ?Pulmonary:  ?   Effort: Pulmonary effort is normal.  ?   Breath sounds: Normal breath sounds.  ?Musculoskeletal:     ?   General: Tenderness present.  ?   Right lower leg: Edema present.  ?   Left lower leg: Edema present.  ?   Comments: 1+ pitting edema bilaterally, R>L  ?Skin: ?   General: Skin is warm and dry.  ?Neurological:  ?   General: No focal deficit present.  ?   Mental Status: She is  alert. Mental status is at baseline.  ?Psychiatric:     ?   Mood and Affect: Mood normal.     ?   Behavior: Behavior normal.  ? ? ?Assessment & Plan: ? ?Problem List Items Addressed This Visit   ? ?  ? Endocrine  ? Hypothyroidism  ?  Stable, continue Levothyroxine 112 mcg, plan to recheck at follow up. ?  ?  ? Relevant Medications  ? metoprolol tartrate (LOPRESSOR) 25 MG tablet  ?  ? Genitourinary  ? Stage 3 chronic kidney disease ([place])  ?  Following with Nephrology.  ?  ?  ? Hypertensive renal disease with renal failure, 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 unspecified  chronic kidney disease - Primary  ?  Stable, blood pressure well controlled. Continue Losartan 100 mg, Norvasc 5 mg and Metoprolol 25 mg BID and continue to check BP at home. Refills today. ?  ?  ? Relevant Medications  ? losartan (COZAAR) 100 MG tablet  ? metoprolol tartrate (LOPRESSOR) 25 MG tablet  ?  ? Other  ? Mixed hyperlipidemia  ?  Reviewed last lipid panel, doing well, continue Crestor 20. ?  ?  ? Relevant Medications  ? losartan (COZAAR) 100 MG tablet  ? metoprolol tartrate (LOPRESSOR) 25 MG tablet  ? rosuvastatin (CRESTOR) 20 MG tablet  ? Prediabetes  ?  Last A1c well controlled, continue to monitor.  ?  ?  ? ? ? ?Return in about 6 months (around 09/12/2022). ? ? ?

## 2022-03-13 NOTE — Patient Instructions (Addendum)
It was great seeing you today! ? ?Plan discussed at today's visit: ?-Medications refilled today  ?-Continue to check blood pressure at home  ? ?Follow up in: 6 months, please fast for labs at least 8-12 hours  ? ?Take care and let us know if you have any questions or concerns prior to your next visit. ? ?Dr. Rosana Berger ? ?

## 2022-03-21 DIAGNOSIS — I129 Hypertensive chronic kidney disease with stage 1 through stage 4 chronic kidney disease, or unspecified chronic kidney disease: Secondary | ICD-10-CM | POA: Diagnosis not present

## 2022-03-21 DIAGNOSIS — R809 Proteinuria, unspecified: Secondary | ICD-10-CM | POA: Diagnosis not present

## 2022-03-21 DIAGNOSIS — N2581 Secondary hyperparathyroidism of renal origin: Secondary | ICD-10-CM | POA: Diagnosis not present

## 2022-03-21 DIAGNOSIS — D631 Anemia in chronic kidney disease: Secondary | ICD-10-CM | POA: Diagnosis not present

## 2022-03-21 DIAGNOSIS — N1832 Chronic kidney disease, stage 3b: Secondary | ICD-10-CM | POA: Diagnosis not present

## 2022-03-28 DIAGNOSIS — N2581 Secondary hyperparathyroidism of renal origin: Secondary | ICD-10-CM | POA: Diagnosis not present

## 2022-03-28 DIAGNOSIS — I10 Essential (primary) hypertension: Secondary | ICD-10-CM | POA: Diagnosis not present

## 2022-03-28 DIAGNOSIS — N1832 Chronic kidney disease, stage 3b: Secondary | ICD-10-CM | POA: Diagnosis not present

## 2022-03-28 DIAGNOSIS — R809 Proteinuria, unspecified: Secondary | ICD-10-CM | POA: Diagnosis not present

## 2022-03-28 DIAGNOSIS — D631 Anemia in chronic kidney disease: Secondary | ICD-10-CM | POA: Diagnosis not present

## 2022-03-29 ENCOUNTER — Ambulatory Visit (INDEPENDENT_AMBULATORY_CARE_PROVIDER_SITE_OTHER): Payer: Medicare Other | Admitting: Podiatry

## 2022-03-29 ENCOUNTER — Encounter: Payer: Self-pay | Admitting: Podiatry

## 2022-03-29 DIAGNOSIS — L603 Nail dystrophy: Secondary | ICD-10-CM

## 2022-03-29 DIAGNOSIS — M79675 Pain in left toe(s): Secondary | ICD-10-CM

## 2022-03-29 DIAGNOSIS — N1832 Chronic kidney disease, stage 3b: Secondary | ICD-10-CM | POA: Diagnosis not present

## 2022-03-29 DIAGNOSIS — M79674 Pain in right toe(s): Secondary | ICD-10-CM | POA: Diagnosis not present

## 2022-03-29 DIAGNOSIS — B351 Tinea unguium: Secondary | ICD-10-CM | POA: Diagnosis not present

## 2022-03-29 DIAGNOSIS — I739 Peripheral vascular disease, unspecified: Secondary | ICD-10-CM

## 2022-03-29 NOTE — Progress Notes (Signed)
This patient returns to my office for at risk foot care.  This patient requires this care by a professional since this patient will be at risk due to having chronic kidney disease  This patient is unable to cut nails herself since the patient cannot reach her nails.These nails are painful walking and wearing shoes. Patient had left great toenail removed last visit and her toe is doing well. This patient presents for at risk foot care today. ? ?General Appearance  Alert, conversant and in no acute stress. ? ?Vascular  Dorsalis pedis  are palpable  Bilaterally.  Posterior tibial pulses are weakly palpable  B/L  Capillary return is within normal limits  Bilaterally.  Cold feet.  Bilaterally.  Absent digital hairs. ? ?Neurologic  Senn-Weinstein monofilament wire test within normal limits  bilaterally. Muscle power within normal limits bilaterally. ? ?Nails Thick disfigured discolored nails with subungual debris  from hallux to fifth toes bilaterally. No evidence of bacterial infection or drainage bilaterally. Her third toenail is self avulsing and the nail plate is unattached left foot.  Healing proximal third toenail right foot. No drainage or infection. ? ?Orthopedic  No limitations of motion  feet .  No crepitus or effusions noted.  No bony pathology or digital deformities noted. ? ?Skin  normotropic skin with no porokeratosis noted bilaterally.  No signs of infections or ulcers noted.    ? ?Onychomycosis  Pain in right toes  Pain in left toes ? ?Consent was obtained for treatment procedures.   Mechanical debridement of nails 1-5  bilaterally performed with a nail nipper.  DSD appl;ied third toenail right foot.Filed with dremel without incident.    ? ? ?Return office visit  10 weeks                   Told patient to return for periodic foot care and evaluation due to potential at risk complications. ? ? ?Helane Gunther DPM  ?

## 2022-04-09 ENCOUNTER — Other Ambulatory Visit: Payer: Self-pay | Admitting: Family Medicine

## 2022-06-01 ENCOUNTER — Other Ambulatory Visit: Payer: Self-pay | Admitting: Internal Medicine

## 2022-06-01 DIAGNOSIS — I129 Hypertensive chronic kidney disease with stage 1 through stage 4 chronic kidney disease, or unspecified chronic kidney disease: Secondary | ICD-10-CM

## 2022-06-01 DIAGNOSIS — E782 Mixed hyperlipidemia: Secondary | ICD-10-CM

## 2022-06-01 NOTE — Telephone Encounter (Signed)
Both have refills remaining. Requested Prescriptions  Pending Prescriptions Disp Refills  . rosuvastatin (CRESTOR) 20 MG tablet [Pharmacy Med Name: ROSUVASTATIN CALCIUM 20 MG TAB] 90 tablet 3    Sig: TAKE 1 TABLET BY MOUTH EVERYDAY AT BEDTIME     Cardiovascular:  Antilipid - Statins 2 Failed - 06/01/2022  3:07 PM      Failed - Cr in normal range and within 360 days    Creat  Date Value Ref Range Status  09/08/2021 1.44 (H) 0.60 - 0.95 mg/dL Final   Creatinine, Urine  Date Value Ref Range Status  10/08/2017 23 20 - 275 mg/dL Final         Failed - Lipid Panel in normal range within the last 12 months    Cholesterol, Total  Date Value Ref Range Status  11/21/2015 147 100 - 199 mg/dL Final   Cholesterol  Date Value Ref Range Status  09/08/2021 141 <200 mg/dL Final   LDL Cholesterol (Calc)  Date Value Ref Range Status  09/08/2021 68 mg/dL (calc) Final    Comment:    Reference range: <100 . Desirable range <100 mg/dL for primary prevention;   <70 mg/dL for patients with CHD or diabetic patients  with > or = 2 CHD risk factors. Marland Kitchen LDL-C is now calculated using the Martin-Hopkins  calculation, which is a validated novel method providing  better accuracy than the Friedewald equation in the  estimation of LDL-C.  Horald Pollen et al. Lenox Ahr. 7829;562(13): 2061-2068  (http://education.[facility].com/faq/FAQ164)    HDL  Date Value Ref Range Status  09/08/2021 49 (L) > OR = 50 mg/dL Final  08/65/7846 51 >96 mg/dL Final   Triglycerides  Date Value Ref Range Status  09/08/2021 158 (H) <150 mg/dL Final         Passed - Patient is not pregnant      Passed - Valid encounter within last 12 months    Recent Outpatient Visits          2 months ago Hypertensive renal disease with renal failure, 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 unspecified chronic kidney disease   [facility] Margarita Mail, DO   8 months ago Need for influenza vaccination   [facility]  Margarita Mail, DO   1 year ago Hypertensive renal disease with renal failure, 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 unspecified chronic kidney disease   [facility] Danelle Berry, PA-C   1 year ago Hypertensive renal disease with renal failure, 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 unspecified chronic kidney disease   [facility] Danelle Berry, PA-C   2 years ago Mixed hyperlipidemia   [facility] Danelle Berry, PA-C      Future Appointments            In 3 months Margarita Mail, DO [facility]   In 5 months  [facility]           . losartan (COZAAR) 100 MG tablet [Pharmacy Med Name: LOSARTAN POTASSIUM 100 MG TAB] 90 tablet 3    Sig: TAKE 1 TABLET BY MOUTH EVERY DAY     Cardiovascular:  Angiotensin Receptor Blockers Failed - 06/01/2022  3:07 PM      Failed - Cr in normal range and within 180 days    Creat  Date Value Ref Range Status  09/08/2021 1.44 (H) 0.60 - 0.95 mg/dL Final   Creatinine, Urine  Date Value Ref Range Status  10/08/2017 23 20 - 275 mg/dL  Final         Failed - K in normal range and within 180 days    Potassium  Date Value Ref Range Status  09/08/2021 4.5 3.5 - 5.3 mmol/L Final         Passed - Patient is not pregnant      Passed - Last BP in normal range    BP Readings from Last 1 Encounters:  03/13/22 124/78         Passed - Valid encounter within last 6 months    Recent Outpatient Visits          2 months ago Hypertensive renal disease with renal failure, 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 unspecified chronic kidney disease   [facility] Margarita Mail, DO   8 months ago Need for influenza vaccination   [facility] Margarita Mail, DO   1 year ago Hypertensive renal disease with renal failure, 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 unspecified chronic kidney disease   [facility] Danelle Berry, PA-C   1 year ago Hypertensive renal disease with renal failure,  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 unspecified chronic kidney disease   [facility] Danelle Berry, PA-C   2 years ago Mixed hyperlipidemia   [facility] Danelle Berry, PA-C      Future Appointments            In 3 months Margarita Mail, DO [facility]   In 5 months  [facility]

## 2022-06-21 ENCOUNTER — Encounter: Payer: Self-pay | Admitting: Podiatry

## 2022-06-21 ENCOUNTER — Ambulatory Visit (INDEPENDENT_AMBULATORY_CARE_PROVIDER_SITE_OTHER): Payer: Medicare Other | Admitting: Podiatry

## 2022-06-21 DIAGNOSIS — L603 Nail dystrophy: Secondary | ICD-10-CM | POA: Diagnosis not present

## 2022-06-21 DIAGNOSIS — M79675 Pain in left toe(s): Secondary | ICD-10-CM

## 2022-06-21 DIAGNOSIS — M79674 Pain in right toe(s): Secondary | ICD-10-CM

## 2022-06-21 DIAGNOSIS — N1832 Chronic kidney disease, stage 3b: Secondary | ICD-10-CM | POA: Diagnosis not present

## 2022-06-21 DIAGNOSIS — B351 Tinea unguium: Secondary | ICD-10-CM

## 2022-06-21 DIAGNOSIS — I739 Peripheral vascular disease, unspecified: Secondary | ICD-10-CM | POA: Diagnosis not present

## 2022-06-21 NOTE — Progress Notes (Signed)
This patient returns to my office for at risk foot care.  This patient requires this care by a professional since this patient will be at risk due to having chronic kidney disease  This patient is unable to cut nails herself since the patient cannot reach her nails.These nails are painful walking and wearing shoes. Patient had left great toenail removed last visit and her toe is doing well. This patient presents for at risk foot care today.  General Appearance  Alert, conversant and in no acute stress.  Vascular  Dorsalis pedis  are palpable  Bilaterally.  Posterior tibial pulses are weakly palpable  B/L  Capillary return is within normal limits  Bilaterally.  Cold feet.  Bilaterally.  Absent digital hairs.  Neurologic  Senn-Weinstein monofilament wire test within normal limits  bilaterally. Muscle power within normal limits bilaterally.  Nails Thick disfigured discolored nails with subungual debris  from hallux to fifth toes bilaterally. No evidence of bacterial infection or drainage bilaterally.   Orthopedic  No limitations of motion  feet .  No crepitus or effusions noted.  No bony pathology or digital deformities noted.  Skin  normotropic skin with no porokeratosis noted bilaterally.  No signs of infections or ulcers noted.     Onychomycosis  Pain in right toes  Pain in left toes  Consent was obtained for treatment procedures.   Mechanical debridement of nails 1-5  bilaterally performed with a nail nipper.  Filed with dremel without incident.      Return office visit  10 weeks                   Told patient to return for periodic foot care and evaluation due to potential at risk complications.   Helane Gunther DPM

## 2022-07-17 DIAGNOSIS — N1832 Chronic kidney disease, stage 3b: Secondary | ICD-10-CM | POA: Diagnosis not present

## 2022-08-15 DIAGNOSIS — N2581 Secondary hyperparathyroidism of renal origin: Secondary | ICD-10-CM | POA: Diagnosis not present

## 2022-08-15 DIAGNOSIS — R809 Proteinuria, unspecified: Secondary | ICD-10-CM | POA: Diagnosis not present

## 2022-08-15 DIAGNOSIS — D631 Anemia in chronic kidney disease: Secondary | ICD-10-CM | POA: Diagnosis not present

## 2022-08-15 DIAGNOSIS — N1832 Chronic kidney disease, stage 3b: Secondary | ICD-10-CM | POA: Diagnosis not present

## 2022-08-15 DIAGNOSIS — I10 Essential (primary) hypertension: Secondary | ICD-10-CM | POA: Diagnosis not present

## 2022-08-23 ENCOUNTER — Ambulatory Visit (INDEPENDENT_AMBULATORY_CARE_PROVIDER_SITE_OTHER): Payer: Medicare Other | Admitting: Podiatry

## 2022-08-23 ENCOUNTER — Encounter: Payer: Self-pay | Admitting: Podiatry

## 2022-08-23 DIAGNOSIS — L853 Xerosis cutis: Secondary | ICD-10-CM

## 2022-08-23 DIAGNOSIS — B351 Tinea unguium: Secondary | ICD-10-CM | POA: Diagnosis not present

## 2022-08-23 DIAGNOSIS — M79674 Pain in right toe(s): Secondary | ICD-10-CM

## 2022-08-23 DIAGNOSIS — M79675 Pain in left toe(s): Secondary | ICD-10-CM

## 2022-08-23 MED ORDER — TRIAMCINOLONE ACETONIDE 0.1 % EX OINT: TOPICAL_OINTMENT | CUTANEOUS | 1 refills | Status: DC

## 2022-08-30 NOTE — Progress Notes (Signed)
  Subjective:  Patient ID: Janice [name], female    DOB: [birth year],  MRN: [id number]  Janice [name] presents to clinic today for at risk foot care with h/o CKD and painful elongated mycotic toenails 1-5 bilaterally which are tender when wearing enclosed shoe gear. Pain is relieved with periodic professional debridement.  New problem(s): None.   PCP is Teodora Medici, DO , and last visit was  March 13, 2022.  No Known Allergies  Review of Systems: Negative except as noted in the HPI.  Objective: No changes noted in today's physical examination. Janice [name] is a pleasant 81 y.o. female in NAD. AAO x 3.  Vascular Examination: CFT <3 seconds b/l. DP pulses faintly palpable b/l. PT pulses diminished b/l. Digital hair absent. Skin temperature gradient warm to cool b/l. No pain with calf compression. No ischemia or gangrene. No cyanosis or clubbing noted b/l.    Neurological Examination: Sensation grossly intact b/l with 10 gram monofilament. Vibratory sensation intact b/l.   Dermatological Examination: Pedal skin warm and supple b/l. Toenails 1-5 b/l thick, discolored, elongated with subungual debris and pain on dorsal palpation.  No open wounds b/l LE. No interdigital macerations noted b/l LE. Pedal skin noted to be dry bilateral heels.  Musculoskeletal Examination: Muscle strength 5/5 to b/l LE. No pain, crepitus or joint limitation noted with ROM bilateral LE. No gross bony deformities bilaterally.  Radiographs: None  Last HgA1c:      Latest Ref Rng & Units 09/08/2021    3:40 PM  Hemoglobin A1C  Hemoglobin-A1c <5.7 % of total Hgb 5.5    Assessment/Plan: 1. Pain due to onychomycosis of toenails of both feet   2. Xerosis cutis     -Patient was evaluated and treated. All patient's and/or POA's questions/concerns answered on today's visit. -Toenails 1-5 bilaterally debrided in length and girth with sterile nail nippers and dremel tool without incident. -Continue soft,  supportive shoe gear daily -Report any pedal problems to medical professional immediately. -Patient/POA to contact office if there are any problems in the interim.  FOLLOW UP:  Return in about 3 months (around 11/22/2022).  Marzetta Board, DPM   -Patient to continue soft, supportive shoe gear daily. -Rx sent to pharmacy for triamcinolone cream 0.1% to be applied to both heels twice daily. -Patient/POA to call should there be question/concern in the interim.   Return in about 3 months (around 11/22/2022).  Marzetta Board, DPM

## 2022-09-11 ENCOUNTER — Encounter: Payer: Self-pay | Admitting: Internal Medicine

## 2022-09-11 ENCOUNTER — Ambulatory Visit (INDEPENDENT_AMBULATORY_CARE_PROVIDER_SITE_OTHER): Payer: Medicare Other | Admitting: Internal Medicine

## 2022-09-11 VITALS — BP 130/72 | HR 78 | Temp 98.1°F | Resp 16 | Ht 62.0 in | Wt 172.6 lb

## 2022-09-11 DIAGNOSIS — R7303 Prediabetes: Secondary | ICD-10-CM

## 2022-09-11 DIAGNOSIS — E039 Hypothyroidism, unspecified: Secondary | ICD-10-CM

## 2022-09-11 DIAGNOSIS — E782 Mixed hyperlipidemia: Secondary | ICD-10-CM | POA: Diagnosis not present

## 2022-09-11 DIAGNOSIS — H6122 Impacted cerumen, left ear: Secondary | ICD-10-CM

## 2022-09-11 DIAGNOSIS — I129 Hypertensive chronic kidney disease with stage 1 through stage 4 chronic kidney disease, or unspecified chronic kidney disease: Secondary | ICD-10-CM

## 2022-09-11 MED ORDER — AMLODIPINE BESYLATE 5 MG PO TABS: 5.0000 mg | ORAL_TABLET | Freq: Every day | ORAL | 1 refills | Status: DC

## 2022-09-11 MED ORDER — LEVOTHYROXINE SODIUM 112 MCG PO TABS: 112.0000 ug | ORAL_TABLET | Freq: Every day | ORAL | 1 refills | Status: DC

## 2022-09-11 NOTE — Progress Notes (Signed)
Janice [name] is a 81 y.o. female that presents today for Follow-up, Hypothyroidism, Diabetes, Hypertension, and Hyperlipidemia   HPI: Janice [name] is a 81 year old female here for 6 month follow up. She is having hearing changes in her left ear, has noticed hearing is worse and feels like it is filled with wax.   Hypertension -Medications: Losartan 100 mg, Norvasc 5 mg (decreased by Nephrology), Metoprolol 25 mg BID -She is compliant with above medications and reports no side effects -Checking BP at home: average about 129-130/80 -Denies any SOB, CP, vision changes or symptoms of hypotension -Does endorse chronic BLE swelling, R>L since vascular surgery  HLD -Medications: Crestor 20 mg -She is compliant with above medications and reports no side effects -Last lipid panel Lipid Panel     Component Value Date/Time   CHOL 141 09/08/2021 1540   CHOL 147 11/21/2015 1001   TRIG 158 (H) 09/08/2021 1540   HDL 49 (L) 09/08/2021 1540   HDL 51 11/21/2015 1001   CHOLHDL 2.9 09/08/2021 1540   VLDL 34 (H) 11/21/2016 1006   LDLCALC 68 09/08/2021 1540   LABVLDL 25 11/21/2015 1001   Pre-Diabetes: -Last A1c 10/22 5.5% -Not currently on any medication  CKD3 -Currently following with Nephrology, sees them every 6 months as well, last seen on 08/15/22 -Last Creatinine 1.49, GFR 35 8/23 -Currently taking Farxiga per Neprho, reports no side effects  Hypothyroidism -Medications: Levothyroxine 112 mcg  -Last TSH 10/22 1.37 -Doing well since increase in dose, denies anxiety, weight loss, lack of appetite, palpitations, skin changes  Vitamin D Deficiency: -Not interested in pursuing DEXA  -Currently on Calcitrol per Nephrology   Review of Systems  Constitutional:  Negative for appetite change, chills, fever and unexpected weight change.  Eyes:  Negative for visual disturbance.  Respiratory:  Negative for cough and shortness of breath.   Cardiovascular:  Negative for chest pain, palpitations  and leg swelling.  Gastrointestinal:  Negative for abdominal pain, blood in stool, constipation and diarrhea.  Endocrine: Negative for cold intolerance and heat intolerance.  Genitourinary:  Negative for dysuria and hematuria.  Skin: Negative.     PAST MEDICAL, SURGICAL, FAMILY AND SOCIAL HISTORY: Reviewed in Epic and signed.  VITALS: Today's Vitals   09/11/22 1429  BP: 130/72  Pulse: 78  Resp: 16  Temp: 98.1 F (36.7 C)  SpO2: 99%  Weight: 172 lb 9.6 oz (78.3 kg)  Height: 5\' 2"  (1.575 m)   Body mass index is 31.57 kg/m.   PHYSICAL EXAM: Physical Exam Constitutional:      Appearance: Normal appearance.  HENT:     Head: Normocephalic and atraumatic.     Right Ear: Tympanic membrane, ear canal and external ear normal.     Left Ear: Ear canal and external ear normal. There is impacted cerumen.  Eyes:     Conjunctiva/sclera: Conjunctivae normal.     Pupils: Pupils are equal, round, and reactive to light.  Cardiovascular:     Rate and Rhythm: Normal rate and regular rhythm.  Pulmonary:     Effort: Pulmonary effort is normal.     Breath sounds: Normal breath sounds.  Musculoskeletal:     Right lower leg: No edema.     Left lower leg: No edema.  Skin:    General: Skin is warm and dry.  Neurological:     General: No focal deficit present.     Mental Status: She is alert. Mental status is at baseline.  Psychiatric:  Mood and Affect: Mood normal.        Behavior: Behavior normal.     Assessment & Plan:  1. Hypertensive renal disease with renal failure, 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 unspecified chronic kidney disease: Blood pressure controlled today, continue Metoprolol 25 mg BID, Losartan 100 mg, Amlodipine 5 mg daily, refilled today.  Following with nephrology, note from 08/15/2022 reviewed, labs from August 2023 reviewed.  Follow-up here in 6 months.  - amLODipine (NORVASC) 5 MG tablet; Take 1 tablet (5 mg total) by mouth daily.  Dispense: 90 tablet; Refill: 1  2. Mixed  hyperlipidemia: Stable.  Continue Crestor 20 mg daily, recheck lipid panel today.  - Lipid Profile  3. Prediabetes: Recheck A1c.  - HgB A1c  4. Hypothyroidism, unspecified type: Stable.  No symptoms.  Continue levothyroxine 112 mcg daily, refilled today.  Recheck thyroid function today as well.  - TSH - levothyroxine (SYNTHROID) 112 MCG tablet; Take 1 tablet (112 mcg total) by mouth daily.  Dispense: 90 tablet; Refill: 1  5. Impacted cerumen of left ear: Cerumen removal today, hearing improved.   - Ear Lavage   Return in about 6 months (around 03/13/2023).

## 2022-09-11 NOTE — Patient Instructions (Signed)
It was great seeing you today!  Plan discussed at today's visit: -Blood work ordered today, results will be uploaded to MyChart.  -Medications refilled   Follow up in: 6 months   Take care and let us know if you have any questions or concerns prior to your next visit.  Dr. Brodie Scovell  

## 2022-09-12 LAB — LIPID PANEL
Cholesterol: 162 mg/dL (ref <200)
HDL: 55 mg/dL (ref >=50)
LDL Cholesterol (Calc): 79 mg/dL (calc)
Non-HDL Cholesterol (Calc): 107 mg/dL (calc) (ref <130)
Total CHOL/HDL Ratio: 2.9 (calc) (ref <5.0)
Triglycerides: 182 mg/dL — ABNORMAL HIGH (ref <150)

## 2022-09-12 LAB — HEMOGLOBIN A1C
Hgb A1c MFr Bld: 5.9 % of total Hgb — ABNORMAL HIGH (ref <5.7)
Mean Plasma Glucose: 123 mg/dL
eAG (mmol/L): 6.8 mmol/L

## 2022-09-12 LAB — TSH: TSH: 2.78 mIU/L (ref 0.40–4.50)

## 2022-09-13 ENCOUNTER — Ambulatory Visit: Payer: Medicare Other | Admitting: Internal Medicine

## 2022-11-13 ENCOUNTER — Encounter: Payer: Self-pay | Admitting: Physician Assistant

## 2022-11-13 ENCOUNTER — Ambulatory Visit (INDEPENDENT_AMBULATORY_CARE_PROVIDER_SITE_OTHER): Payer: Medicare Other | Admitting: Physician Assistant

## 2022-11-13 DIAGNOSIS — Z0000 Encounter for general adult medical examination without abnormal findings: Secondary | ICD-10-CM

## 2022-11-13 NOTE — Patient Instructions (Signed)
Janice [name] , Thank you for taking time to come for your Medicare Wellness Visit. I appreciate your ongoing commitment to your health goals. Please review the following plan we discussed and let me know if I can assist you in the future.   Screening recommendations/referrals: Colonoscopy: No longer required  Mammogram: No longer required  Bone Density: You have declined to repeat this scan- please let us know if you change your mind  Recommended yearly ophthalmology/optometry visit for glaucoma screening and checkup Recommended yearly dental visit for hygiene and checkup  Vaccinations: Influenza vaccine: Completed for this year  Pneumococcal vaccine: Completed course  Tdap vaccine: Over due- please complete this at your leisure  Shingles vaccine: over due- please speak to your PCP about getting this vaccine   Covid-19:Please stay up to date with the recommended boosters for your age group    Next appointment: Follow up in one year for your annual wellness visit    Preventive Care 65 Years and Older, Female Preventive care refers to lifestyle choices and visits with your health care provider that can promote health and wellness. What does preventive care include? A yearly physical exam. This is also called an annual well check. Dental exams once or twice a year. Routine eye exams. Ask your health care provider how often you should have your eyes checked. Personal lifestyle choices, including: Daily care of your teeth and gums. Regular physical activity. Eating a healthy diet. Avoiding tobacco and drug use. Limiting alcohol use. Practicing safe sex. Taking low-dose aspirin every day. Taking vitamin and mineral supplements as recommended by your health care provider. What happens during an annual well check? The services and screenings done by your health care provider during your annual well check will depend on your age, overall health, lifestyle risk factors, and family history of  disease. Counseling  Your health care provider may ask you questions about your: Alcohol use. Tobacco use. Drug use. Emotional well-being. Home and relationship well-being. Sexual activity. Eating habits. History of falls. Memory and ability to understand (cognition). Work and work Astronomer. Reproductive health. Screening  You may have the following tests or measurements: Height, weight, and BMI. Blood pressure. Lipid and cholesterol levels. These may be checked every 5 years, or more frequently if you are over 72 years old. Skin check. Lung cancer screening. You may have this screening every year starting at age 36 if you have a 30-pack-year history of smoking and currently smoke or have quit within the past 15 years. Fecal occult blood test (FOBT) of the stool. You may have this test every year starting at age 30. Flexible sigmoidoscopy or colonoscopy. You may have a sigmoidoscopy every 5 years or a colonoscopy every 10 years starting at age 53. Hepatitis C blood test. Hepatitis B blood test. Sexually transmitted disease (STD) testing. Diabetes screening. This is done by checking your blood sugar (glucose) after you have not eaten for a while (fasting). You may have this done every 1-3 years. Bone density scan. This is done to screen for osteoporosis. You may have this done starting at age 34. Mammogram. This may be done every 1-2 years. Talk to your health care provider about how often you should have regular mammograms. Talk with your health care provider about your test results, treatment options, and if necessary, the need for more tests. Vaccines  Your health care provider may recommend certain vaccines, such as: Influenza vaccine. This is recommended every year. Tetanus, diphtheria, and acellular pertussis (Tdap, Td) vaccine. You  may need a Td booster every 10 years. Zoster vaccine. You may need this after age 61. Pneumococcal 13-valent conjugate (PCV13) vaccine. One  dose is recommended after age 19. Pneumococcal polysaccharide (PPSV23) vaccine. One dose is recommended after age 45. Talk to your health care provider about which screenings and vaccines you need and how often you need them. This information is not intended to replace advice given to you by your health care provider. Make sure you discuss any questions you have with your health care provider. Document Released: 12/16/2015 Document Revised: 08/08/2016 Document Reviewed: 09/20/2015 Elsevier Interactive Patient Education  2017 [employer].  Fall Prevention in the Home Falls can cause injuries. They can happen to people of all ages. There are many things you can do to make your home safe and to help prevent falls. What can I do on the outside of my home? Regularly fix the edges of walkways and driveways and fix any cracks. Remove anything that might make you trip as you walk through a door, such as a raised step or threshold. Trim any bushes or trees on the path to your home. Use bright outdoor lighting. Clear any walking paths of anything that might make someone trip, such as rocks or tools. Regularly check to see if handrails are loose or broken. Make sure that both sides of any steps have handrails. Any raised decks and porches should have guardrails on the edges. Have any leaves, snow, or ice cleared regularly. Use sand or salt on walking paths during winter. Clean up any spills in your garage right away. This includes oil or grease spills. What can I do in the bathroom? Use night lights. Install grab bars by the toilet and in the tub and shower. Do not use towel bars as grab bars. Use non-skid mats or decals in the tub or shower. If you need to sit down in the shower, use a plastic, non-slip stool. Keep the floor dry. Clean up any water that spills on the floor as soon as it happens. Remove soap buildup in the tub or shower regularly. Attach bath mats securely with double-sided  non-slip rug tape. Do not have throw rugs and other things on the floor that can make you trip. What can I do in the bedroom? Use night lights. Make sure that you have a light by your bed that is easy to reach. Do not use any sheets or blankets that are too big for your bed. They should not hang down onto the floor. Have a firm chair that has side arms. You can use this for support while you get dressed. Do not have throw rugs and other things on the floor that can make you trip. What can I do in the kitchen? Clean up any spills right away. Avoid walking on wet floors. Keep items that you use a lot in easy-to-reach places. If you need to reach something above you, use a strong step stool that has a grab bar. Keep electrical cords out of the way. Do not use floor polish or wax that makes floors slippery. If you must use wax, use non-skid floor wax. Do not have throw rugs and other things on the floor that can make you trip. What can I do with my stairs? Do not leave any items on the stairs. Make sure that there are handrails on both sides of the stairs and use them. Fix handrails that are broken or loose. Make sure that handrails are as long as the  stairways. Check any carpeting to make sure that it is firmly attached to the stairs. Fix any carpet that is loose or worn. Avoid having throw rugs at the top or bottom of the stairs. If you do have throw rugs, attach them to the floor with carpet tape. Make sure that you have a light switch at the top of the stairs and the bottom of the stairs. If you do not have them, ask someone to add them for you. What else can I do to help prevent falls? Wear shoes that: Do not have high heels. Have rubber bottoms. Are comfortable and fit you well. Are closed at the toe. Do not wear sandals. If you use a stepladder: Make sure that it is fully opened. Do not climb a closed stepladder. Make sure that both sides of the stepladder are locked into place. Ask  someone to hold it for you, if possible. Clearly mark and make sure that you can see: Any grab bars or handrails. First and last steps. Where the edge of each step is. Use tools that help you move around (mobility aids) if they are needed. These include: Canes. Walkers. Scooters. Crutches. Turn on the lights when you go into a dark area. Replace any light bulbs as soon as they burn out. Set up your furniture so you have a clear path. Avoid moving your furniture around. If any of your floors are uneven, fix them. If there are any pets around you, be aware of where they are. Review your medicines with your doctor. Some medicines can make you feel dizzy. This can increase your chance of falling. Ask your doctor what other things that you can do to help prevent falls. This information is not intended to replace advice given to you by your health care provider. Make sure you discuss any questions you have with your health care provider. Document Released: 09/15/2009 Document Revised: 04/26/2016 Document Reviewed: 12/24/2014 Elsevier Interactive Patient Education  2017 [employer].

## 2022-11-13 NOTE — Progress Notes (Signed)
Virtual Visit via Telephone Note  I connected with Janice [name] on 11/13/22 at 10:40 AM  by telephone and verified that I am speaking with the correct person using two identifiers.  Location: Patient: at home, [place], [place]  Provider: [facility], [place], [place]     I discussed the limitations, risks, security and privacy concerns of performing an evaluation and management service by telephone and the availability of in person appointments. The patient expressed understanding and agreed to proceed.  Interactive audio and video telecommunications were attempted between this nurse and patient, however failed, due to patient having technical difficulties OR patient did not have access to video capability.  We continued and completed visit with audio only.  Some vital signs may be absent or patient reported.     Subjective:   Janice [name] is a 81 y.o. female who presents for Medicare Annual (Subsequent) preventive examination. Today's Provider: Jacquelin Hawking, MHS, PA-C Introduced myself to the patient as a PA-C and provided education on APPs in clinical practice.     Review of Systems:  Review of Systems  Constitutional:  Negative for chills, fever, malaise/fatigue and weight loss.  HENT:  Negative for hearing loss.   Eyes:  Negative for blurred vision and double vision.  Respiratory:  Negative for shortness of breath and wheezing.   Cardiovascular:  Negative for chest pain and leg swelling.  Musculoskeletal:  Negative for falls.  Neurological:  Negative for dizziness, loss of consciousness and headaches.  Psychiatric/Behavioral:  Negative for depression and memory loss. The patient is not nervous/anxious.     Cardiac Risk Factors include: advanced age (>32men, >26 women)     Objective:     Vitals: There were no vitals taken for this visit.  There is no height or weight on file to calculate BMI.     11/09/2021   10:47 AM 03/16/2021    8:28 AM 10/04/2020     4:02 PM 08/18/2019    3:01 PM 08/15/2018    2:49 PM 10/10/2017    7:32 AM 09/28/2017    6:53 PM  Advanced Directives  Does Patient Have a Medical Advance Directive? No No Yes No Yes No No  Type of Advance Directive   Out of facility DNR (pink MOST or yellow form)  Healthcare Power of [place];Living will    Copy of Healthcare Power of Attorney in Chart?     No - copy requested    Would patient like information on creating a medical advance directive? No - Patient declined No - Patient declined  Yes (MAU/Ambulatory/Procedural Areas - Information given)  No - Patient declined No - Patient declined    Tobacco Social History   Tobacco Use  Smoking Status Former   Packs/day: 2.00   Years: 50.00   Total pack years: 100.00   Types: Cigarettes   Quit date: 12/03/2008   Years since quitting: 13.9  Smokeless Tobacco Never  Tobacco Comments   smoking cessation materials not required     Counseling given: Not Answered Tobacco comments: smoking cessation materials not required   Clinical Intake:  Pre-visit preparation completed: Yes  Pain : No/denies pain     Nutritional Status: BMI > 30  Obese Nutritional Risks: Unintentional weight loss Diabetes: No  How often do you need to have someone help you when you read instructions, pamphlets, or other written materials from your doctor or pharmacy?: 1 - Never What is the last grade level you completed in school?: 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   INSERTION OF MESH N/A 03/05/2016   Procedure: INSERTION OF MESH;  Surgeon: [name], MD;  Location: [facility] ORS;  Service: General;  Laterality: N/A;   VENTRAL HERNIA REPAIR N/A 03/05/2016   Procedure: HERNIA REPAIR VENTRAL ADULT;  Surgeon: [name], MD;  Location: [facility] ORS;  Service: General;  Laterality: N/A;   Family History  Problem Relation Age of Onset   Heart disease Mother    Hypertension Mother    Heart disease Father    Hypertension Father    Hypertension Sister    Diabetes Sister    Cancer Sister        lung   COPD Neg Hx    Stroke Neg Hx    Social History   Socioeconomic History   Marital status: Widowed    Spouse name: Not on file   Number of children: 4   Years of education: some college   Highest education level: 12th grade  Occupational History   Occupation: Retired  Tobacco Use   Smoking status: Former    Packs/day: 2.00    Years: 50.00    Total pack years: 100.00    Types: Cigarettes    Quit date: 12/03/2008    Years since quitting: 13.9   Smokeless tobacco: Never   Tobacco comments:    smoking cessation materials not required  Vaping Use   Vaping Use: Never used  Substance and Sexual Activity   Alcohol use: Yes    Alcohol/week: 1.0 standard drink of alcohol    Types: 1 Glasses of wine per week    Comment: OCCASIONALLY   Drug use: No   Sexual activity: Not Currently  Other Topics Concern   Not on file  Social History Narrative   Pt lives with her daughter   Social Determinants of Health   Financial Resource Strain: Low Risk  (11/13/2022)   Overall Financial Resource Strain (CARDIA)    Difficulty of Paying Living Expenses: Not hard at all  Food Insecurity: No Food Insecurity (11/13/2022)   Hunger Vital Sign    Worried About Running Out of Food in the Last Year: Never true    Ran Out of Food in the Last Year: Never true  Transportation Needs: No  Transportation Needs (11/13/2022)   PRAPARE - Administrator, Civil Service (Medical): No    Lack of Transportation (Non-Medical): No  Physical Activity: Sufficiently Active (11/13/2022)   Exercise Vital Sign    Days of Exercise per Week: 7 days    Minutes of Exercise per Session: 30 min  Stress: No Stress Concern Present (11/13/2022)   [employer] of Occupational Health - Occupational Stress Questionnaire    Feeling of Stress : Not at all  Social Connections: Moderately Isolated (11/13/2022)   Social Connection and Isolation Panel [NHANES]  Frequency of Communication with Friends and Family: More than three times a week    Frequency of Social Gatherings with Friends and Family: More than three times a week    Attends Religious Services: 1 to 4 times per year    Active Member of [employer] or Organizations: No    Attends Banker Meetings: Never    Marital Status: Widowed    Outpatient Encounter Medications as of 11/13/2022  Medication Sig   amLODipine (NORVASC) 5 MG tablet Take 1 tablet (5 mg total) by mouth daily.   aspirin EC 81 MG tablet Take 81 mg daily by mouth.   FARXIGA 10 MG TABS tablet Take 1 tablet by mouth daily.   levothyroxine (SYNTHROID) 112 MCG tablet Take 1 tablet (112 mcg total) by mouth daily.   losartan (COZAAR) 100 MG tablet Take 1 tablet (100 mg total) by mouth daily.   metoprolol tartrate (LOPRESSOR) 25 MG tablet Take 1 tablet (25 mg total) by mouth 2 (two) times daily.   rosuvastatin (CRESTOR) 20 MG tablet TAKE 1 TABLET BY MOUTH EVERYDAY AT BEDTIME   triamcinolone ointment (KENALOG) 0.1 % Use gloves to apply to rough skin on feet once daily.   No facility-administered encounter medications on file as of 11/13/2022.    Activities of Daily Living    11/13/2022   11:00 AM 11/13/2022    8:50 AM  In your present state of health, do you have any difficulty performing the following activities:  Hearing? 0 0  Vision? 0 0  Difficulty  concentrating or making decisions? 0 0  Walking or climbing stairs? 0 0  Dressing or bathing? 0 0  Doing errands, shopping? 0 0  Preparing Food and eating ? N   Using the Toilet? N   In the past six months, have you accidently leaked urine? N   Do you have problems with loss of bowel control? N   Managing your Medications? N   Managing your Finances? N   Housekeeping or managing your Housekeeping? N     Patient Care Team: Margarita Mail, DO as PCP - General (Internal Medicine) Lamont Dowdy, MD as Consulting Physician (Internal Medicine) Helane Gunther, DPM as Consulting Physician (Podiatry)    Assessment:   This is a routine wellness examination for [place].  Exercise Activities and Dietary recommendations Type of exercise: walking (walks dog everyday), Exercise limited by: None identified   Goals Addressed   None     Fall Risk:    11/13/2022    8:49 AM 09/11/2022    2:33 PM 03/13/2022    2:26 PM 11/09/2021   10:48 AM 09/08/2021    3:15 PM  Fall Risk   Falls in the past year? 0 0 0 0 0  Number falls in past yr: 0 0 0 0 0  Injury with Fall? 0 0 0 0 0  Risk for fall due to : No Fall Risks   No Fall Risks   Follow up Falls prevention discussed;Education provided;Falls evaluation completed   Falls prevention discussed     FALL RISK PREVENTION PERTAINING TO THE HOME:  Any stairs in or around the home? Yes  If so, are there any without handrails? Yes   Home free of loose throw rugs in walkways, pet beds, electrical cords, etc? Yes  Adequate lighting in your home to reduce risk of falls? Yes   ASSISTIVE DEVICES UTILIZED TO PREVENT FALLS:  Life alert? No  Use of a cane, walker or w/c? No  Grab bars in the bathroom? Yes  Shower chair or bench in shower? No  Elevated toilet seat or a handicapped toilet? No   DME ORDERS:  DME order needed?  No   TIMED UP AND GO:  Was the test performed? No Telephone visit.  Length of time to ambulate 10 feet: 0 sec.      Depression Screen    11/13/2022    8:50 AM 09/11/2022    2:33 PM 03/13/2022    2:28 PM 11/09/2021   10:47 AM  PHQ 2/9 Scores  PHQ - 2 Score 0 0 0 0  PHQ- 9 Score 0 0 0      Cognitive Function        11/13/2022   11:03 AM 10/04/2020    4:03 PM 08/18/2019    3:03 PM 08/15/2018    2:48 PM  6CIT Screen  What Year? 0 points 0 points 0 points 0 points  What month? 0 points 0 points 0 points 0 points  What time? 0 points 0 points 0 points 3 points  Count back from 20 0 points 0 points 0 points 0 points  Months in reverse 0 points 0 points 0 points 0 points  Repeat phrase 2 points 0 points 0 points 4 points  Total Score 2 points 0 points 0 points 7 points    Immunization History  Administered Date(s) Administered   Fluad Quad(high Dose 65+) 09/04/2019, 09/02/2020, 09/08/2021   Influenza, High Dose Seasonal PF 10/08/2017, 08/15/2018   Influenza,inj,Quad PF,6+ Mos 10/17/2016   Influenza-Unspecified 11/15/2014   Pneumococcal Conjugate-13 11/15/2014   Pneumococcal Polysaccharide-23 09/04/2019   Td 12/03/2008    Qualifies for Shingles Vaccine? Yes  Pt Declined. Due for Shingrix. Education has been provided regarding the importance of this vaccine. Pt has been advised to call insurance company to determine out of pocket expense. Advised may also receive vaccine at local pharmacy or Health Dept. Verbalized acceptance and understanding.  Tdap: Pt Declined  Flu Vaccine: Pt Declined  Pneumococcal Vaccine: Completed  Covid-19 Vaccine: Declined   Screening Tests Health Maintenance  Topic Date Due   DEXA SCAN  Never done   DTaP/Tdap/Td (2 - Tdap) 12/03/2018   Medicare Annual Wellness (AWV)  11/09/2022   COVID-19 Vaccine (1) 11/29/2022 (Originally 03/19/1942)   Zoster Vaccines- Shingrix (1 of 2) 12/12/2022 (Originally 09/18/1989)   INFLUENZA VACCINE  03/03/2023 (Originally 07/03/2022)   Pneumonia Vaccine 58+ Years old  Completed   HPV VACCINES  Aged Out    Cancer  Screenings:  Colorectal Screening: No longer required  Mammogram:  No longer required  Bone Density: Declined Lung Cancer Screening: (Low Dose CT Chest recommended if Age 73-80 years, 30 pack-year currently smoking OR have quit w/in 15years.) does not qualify.     Additional Screening:  Hepatitis C Screening: does not qualify  Vision Screening: Recommended annual ophthalmology exams for early detection of glaucoma and other disorders of the eye. Is the patient up to date with their annual eye exam?  No   If pt is not established with a provider, would they like to be referred to a provider to establish care? No . .  Dental Screening: Recommended annual dental exams for proper oral hygiene  Community Resource Referral:  CRR required this visit?  No       Plan:  I have personally reviewed and addressed the Medicare Annual Wellness questionnaire and have noted the following in the patient's chart:  A. Medical and social history B. Use of  alcohol, tobacco or illicit drugs  C. Current medications and supplements D. Functional ability and status E.  Nutritional status F.  Physical activity G. Advance directives H. List of other physicians I.  Hospitalizations, surgeries, and ER visits in previous 12 months J.  Vitals K. Screenings such as hearing and vision if needed, cognitive and depression L. Referrals and appointments   In addition, I have reviewed and discussed with patient certain preventive protocols, quality metrics, and best practice recommendations. A written personalized care plan for preventive services as well as general preventive health recommendations were provided to patient.  Signed,   Jacquelin HawkingErin Lebron Nauert, MHS, PA-C [facility]

## 2022-12-06 ENCOUNTER — Ambulatory Visit (INDEPENDENT_AMBULATORY_CARE_PROVIDER_SITE_OTHER): Payer: Medicare Other | Admitting: Podiatry

## 2022-12-06 ENCOUNTER — Encounter: Payer: Self-pay | Admitting: Podiatry

## 2022-12-06 VITALS — BP 128/80

## 2022-12-06 DIAGNOSIS — M79675 Pain in left toe(s): Secondary | ICD-10-CM | POA: Diagnosis not present

## 2022-12-06 DIAGNOSIS — M79674 Pain in right toe(s): Secondary | ICD-10-CM | POA: Diagnosis not present

## 2022-12-06 DIAGNOSIS — I739 Peripheral vascular disease, unspecified: Secondary | ICD-10-CM | POA: Diagnosis not present

## 2022-12-06 DIAGNOSIS — B351 Tinea unguium: Secondary | ICD-10-CM

## 2022-12-06 NOTE — Progress Notes (Signed)
  Subjective:  Patient ID: Janice [name], female    DOB: [birth year],  MRN: [id number]  [name] presents to clinic today for at risk foot care. Patient has h/o PAD and painful thick toenails that are difficult to trim. Pain interferes with ambulation. Aggravating factors include wearing enclosed shoe gear. Pain is relieved with periodic professional debridement.  Chief Complaint  Patient presents with   Nail Problem    Nail trim  Not Diabetic PCP- Dr Rosana Berger, last OV 4 months ago    New problem(s): None.   PCP is Teodora Medici, DO.  No Known Allergies  Review of Systems: Negative except as noted in the HPI.  Objective: No changes noted in today's physical examination. Vitals:   12/06/22 1556  BP: 128/80   Janice [name] is a pleasant 82 y.o. female WD, WN in NAD. AAO x 3.  Vascular Examination: CFT <3 seconds b/l. DP pulses faintly palpable b/l. PT pulses diminished b/l. Digital hair absent. Skin temperature gradient warm to cool b/l. No pain with calf compression. No ischemia or gangrene. No cyanosis or clubbing noted b/l.    Neurological Examination: Sensation grossly intact b/l with 10 gram monofilament. Vibratory sensation intact b/l.   Dermatological Examination: Pedal skin warm and supple b/l. Toenails 1-5 b/l thick, discolored, elongated with subungual debris and pain on dorsal palpation.  No open wounds b/l LE. No interdigital macerations noted b/l LE.   Musculoskeletal Examination: Muscle strength 5/5 to b/l LE. No pain, crepitus or joint limitation noted with ROM bilateral LE. No gross bony deformities bilaterally.  Radiographs: None  Assessment/Plan: 1. Pain due to onychomycosis of toenails of both feet   2. Peripheral vascular disease ([place])     -Consent given for treatment as described below: -Continue supportive shoe gear daily. -Mycotic toenails 1-5 bilaterally were debrided in length and girth with sterile nail nippers and dremel. Pinpoint  bleeding of R 3rd toe addressed with Lumicain Hemostatic Solution, cleansed with alcohol. triple antibiotic ointment applied. No further treatment required by patient/caregiver. -Patient/POA to call should there be question/concern in the interim.   Return in about 3 months (around 03/07/2023).  Marzetta Board, DPM

## 2022-12-10 ENCOUNTER — Other Ambulatory Visit: Payer: Self-pay | Admitting: Internal Medicine

## 2022-12-10 DIAGNOSIS — I129 Hypertensive chronic kidney disease with stage 1 through stage 4 chronic kidney disease, or unspecified chronic kidney disease: Secondary | ICD-10-CM

## 2022-12-10 NOTE — Telephone Encounter (Signed)
Medication Refill - Medication: losartan (COZAAR) 100 MG tablet   Has the patient contacted their pharmacy? Yes.    Medication refill was sent to wrong pharmacy, pharmacy advised pt to contact her PCP.   Preferred Pharmacy (with phone number or street name):  [id number] - [place], [place] Phone: [phone]  Fax: [phone]     Has the patient been seen for an appointment in the last year OR does the patient have an upcoming appointment? Yes.    Agent: Please be advised that RX refills may take up to 3 business days. We ask that you follow-up with your pharmacy.

## 2022-12-11 MED ORDER — LOSARTAN POTASSIUM 100 MG PO TABS: 100.0000 mg | ORAL_TABLET | Freq: Every day | ORAL | 1 refills | Status: DC

## 2022-12-11 NOTE — Telephone Encounter (Signed)
Requested medication (s) are due for refill today:   Yes  Requested medication (s) are on the active medication list:   Yes  Future visit scheduled:   Yes   Last ordered: 03/13/2022 #90, 3 refills  Returned because labs are due per protocol.     [id number]  Last refill was sent to wrong pharmacy.   Requested Prescriptions  Pending Prescriptions Disp Refills   losartan (COZAAR) 100 MG tablet 90 tablet 3    Sig: Take 1 tablet (100 mg total) by mouth daily.     Cardiovascular:  Angiotensin Receptor Blockers Failed - 12/10/2022  2:02 PM      Failed - Cr in normal range and within 180 days    Creat  Date Value Ref Range Status  09/08/2021 1.44 (H) 0.60 - 0.95 mg/dL Final   Creatinine, Urine  Date Value Ref Range Status  10/08/2017 23 20 - 275 mg/dL Final         Failed - K in normal range and within 180 days    Potassium  Date Value Ref Range Status  09/08/2021 4.5 3.5 - 5.3 mmol/L Final         Passed - Patient is not pregnant      Passed - Last BP in normal range    BP Readings from Last 1 Encounters:  12/06/22 128/80         Passed - Valid encounter within last 6 months    Recent Outpatient Visits           4 weeks ago Encounter for [employer] annual wellness exam   [facility] Mecum, Erin E, PA-C   3 months ago Hypertensive renal disease with renal failure, 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 unspecified chronic kidney disease   [place], DO   9 months ago Hypertensive renal disease with renal failure, 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 unspecified chronic kidney disease   [place], DO   1 year ago Need for influenza vaccination   [facility] Teodora Medici, DO   1 year ago Hypertensive renal disease with renal failure, 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 unspecified chronic kidney disease   [facility] Delsa Grana, PA-C       Future Appointments             In 3  months Teodora Medici, [facility], [facility]   In 11 months  [facility], [facility]

## 2022-12-20 ENCOUNTER — Other Ambulatory Visit: Payer: Self-pay | Admitting: Podiatry

## 2023-01-22 DIAGNOSIS — R809 Proteinuria, unspecified: Secondary | ICD-10-CM | POA: Diagnosis not present

## 2023-01-22 DIAGNOSIS — N1832 Chronic kidney disease, stage 3b: Secondary | ICD-10-CM | POA: Diagnosis not present

## 2023-01-29 DIAGNOSIS — R809 Proteinuria, unspecified: Secondary | ICD-10-CM | POA: Diagnosis not present

## 2023-01-29 DIAGNOSIS — N1832 Chronic kidney disease, stage 3b: Secondary | ICD-10-CM | POA: Diagnosis not present

## 2023-01-29 DIAGNOSIS — I129 Hypertensive chronic kidney disease with stage 1 through stage 4 chronic kidney disease, or unspecified chronic kidney disease: Secondary | ICD-10-CM | POA: Diagnosis not present

## 2023-01-29 DIAGNOSIS — N281 Cyst of kidney, acquired: Secondary | ICD-10-CM | POA: Diagnosis not present

## 2023-01-29 DIAGNOSIS — E039 Hypothyroidism, unspecified: Secondary | ICD-10-CM | POA: Diagnosis not present

## 2023-01-29 DIAGNOSIS — D631 Anemia in chronic kidney disease: Secondary | ICD-10-CM | POA: Diagnosis not present

## 2023-01-29 DIAGNOSIS — E785 Hyperlipidemia, unspecified: Secondary | ICD-10-CM | POA: Diagnosis not present

## 2023-01-29 DIAGNOSIS — N2581 Secondary hyperparathyroidism of renal origin: Secondary | ICD-10-CM | POA: Diagnosis not present

## 2023-01-31 ENCOUNTER — Emergency Department (HOSPITAL_COMMUNITY): Payer: Medicare Other

## 2023-01-31 ENCOUNTER — Other Ambulatory Visit: Payer: Self-pay

## 2023-01-31 ENCOUNTER — Inpatient Hospital Stay (HOSPITAL_COMMUNITY)
Admission: EM | Admit: 2023-01-31 | Discharge: 2023-02-02 | DRG: 392 | Disposition: A | Discharge status: Home Health | Payer: Medicare Other | Attending: Family Medicine | Admitting: Family Medicine

## 2023-01-31 ENCOUNTER — Encounter (HOSPITAL_COMMUNITY): Payer: Self-pay

## 2023-01-31 DIAGNOSIS — E669 Obesity, unspecified: Secondary | ICD-10-CM | POA: Diagnosis present

## 2023-01-31 DIAGNOSIS — I10 Essential (primary) hypertension: Secondary | ICD-10-CM | POA: Diagnosis not present

## 2023-01-31 DIAGNOSIS — E860 Dehydration: Secondary | ICD-10-CM | POA: Diagnosis present

## 2023-01-31 DIAGNOSIS — Z66 Do not resuscitate: Secondary | ICD-10-CM | POA: Diagnosis present

## 2023-01-31 DIAGNOSIS — K5730 Diverticulosis of large intestine without perforation or abscess without bleeding: Secondary | ICD-10-CM | POA: Diagnosis not present

## 2023-01-31 DIAGNOSIS — N289 Disorder of kidney and ureter, unspecified: Secondary | ICD-10-CM | POA: Diagnosis not present

## 2023-01-31 DIAGNOSIS — K529 Noninfective gastroenteritis and colitis, unspecified: Secondary | ICD-10-CM | POA: Diagnosis present

## 2023-01-31 DIAGNOSIS — R42 Dizziness and giddiness: Secondary | ICD-10-CM | POA: Diagnosis not present

## 2023-01-31 DIAGNOSIS — E782 Mixed hyperlipidemia: Secondary | ICD-10-CM | POA: Diagnosis present

## 2023-01-31 DIAGNOSIS — R112 Nausea with vomiting, unspecified: Secondary | ICD-10-CM | POA: Diagnosis not present

## 2023-01-31 DIAGNOSIS — N1832 Chronic kidney disease, stage 3b: Secondary | ICD-10-CM | POA: Diagnosis present

## 2023-01-31 DIAGNOSIS — N179 Acute kidney failure, unspecified: Secondary | ICD-10-CM | POA: Diagnosis present

## 2023-01-31 DIAGNOSIS — Z79890 Hormone replacement therapy (postmenopausal): Secondary | ICD-10-CM

## 2023-01-31 DIAGNOSIS — A084 Viral intestinal infection, unspecified: Secondary | ICD-10-CM | POA: Diagnosis present

## 2023-01-31 DIAGNOSIS — I129 Hypertensive chronic kidney disease with stage 1 through stage 4 chronic kidney disease, or unspecified chronic kidney disease: Secondary | ICD-10-CM | POA: Diagnosis present

## 2023-01-31 DIAGNOSIS — E876 Hypokalemia: Secondary | ICD-10-CM | POA: Diagnosis not present

## 2023-01-31 DIAGNOSIS — I739 Peripheral vascular disease, unspecified: Secondary | ICD-10-CM | POA: Diagnosis present

## 2023-01-31 DIAGNOSIS — E7800 Pure hypercholesterolemia, unspecified: Secondary | ICD-10-CM | POA: Diagnosis present

## 2023-01-31 DIAGNOSIS — E039 Hypothyroidism, unspecified: Secondary | ICD-10-CM | POA: Diagnosis present

## 2023-01-31 DIAGNOSIS — R509 Fever, unspecified: Secondary | ICD-10-CM | POA: Diagnosis not present

## 2023-01-31 DIAGNOSIS — Z87891 Personal history of nicotine dependence: Secondary | ICD-10-CM | POA: Diagnosis not present

## 2023-01-31 DIAGNOSIS — Z8249 Family history of ischemic heart disease and other diseases of the circulatory system: Secondary | ICD-10-CM | POA: Diagnosis not present

## 2023-01-31 DIAGNOSIS — Z7982 Long term (current) use of aspirin: Secondary | ICD-10-CM

## 2023-01-31 DIAGNOSIS — Z1152 Encounter for screening for COVID-19: Secondary | ICD-10-CM | POA: Diagnosis not present

## 2023-01-31 DIAGNOSIS — Z7984 Long term (current) use of oral hypoglycemic drugs: Secondary | ICD-10-CM

## 2023-01-31 DIAGNOSIS — D631 Anemia in chronic kidney disease: Secondary | ICD-10-CM | POA: Diagnosis present

## 2023-01-31 DIAGNOSIS — Z801 Family history of malignant neoplasm of trachea, bronchus and lung: Secondary | ICD-10-CM | POA: Diagnosis not present

## 2023-01-31 DIAGNOSIS — Z79899 Other long term (current) drug therapy: Secondary | ICD-10-CM | POA: Diagnosis not present

## 2023-01-31 DIAGNOSIS — Z6831 Body mass index (BMI) 31.0-31.9, adult: Secondary | ICD-10-CM | POA: Diagnosis not present

## 2023-01-31 DIAGNOSIS — Z833 Family history of diabetes mellitus: Secondary | ICD-10-CM

## 2023-01-31 DIAGNOSIS — D72829 Elevated white blood cell count, unspecified: Secondary | ICD-10-CM

## 2023-01-31 LAB — COMPREHENSIVE METABOLIC PANEL
ALT: 14 U/L (ref 0–44)
AST: 26 U/L (ref 15–41)
Albumin: 2.6 g/dL — ABNORMAL LOW (ref 3.5–5.0)
Alkaline Phosphatase: 77 U/L (ref 38–126)
Anion gap: 16 — ABNORMAL HIGH (ref 5–15)
BUN: 21 mg/dL (ref 8–23)
CO2: 21 mmol/L — ABNORMAL LOW (ref 22–32)
Calcium: 9.1 mg/dL (ref 8.9–10.3)
Chloride: 101 mmol/L (ref 98–111)
Creatinine, Ser: 1.85 mg/dL — ABNORMAL HIGH (ref 0.44–1.00)
GFR, Estimated: 27 mL/min — ABNORMAL LOW (ref >60)
Glucose, Bld: 175 mg/dL — ABNORMAL HIGH (ref 70–99)
Potassium: 2.9 mmol/L — ABNORMAL LOW (ref 3.5–5.1)
Sodium: 138 mmol/L (ref 135–145)
Total Bilirubin: 0.9 mg/dL (ref 0.3–1.2)
Total Protein: 6.5 g/dL (ref 6.5–8.1)

## 2023-01-31 LAB — CBC WITH DIFFERENTIAL/PLATELET
Abs Immature Granulocytes: 0 10*3/uL (ref 0.00–0.07)
Basophils Absolute: 0 10*3/uL (ref 0.0–0.1)
Basophils Relative: 0 %
Eosinophils Absolute: 0 10*3/uL (ref 0.0–0.5)
Eosinophils Relative: 0 %
HCT: 36.3 % (ref 36.0–46.0)
Hemoglobin: 12.2 g/dL (ref 12.0–15.0)
Lymphocytes Relative: 3 %
Lymphs Abs: 0.8 10*3/uL (ref 0.7–4.0)
MCH: 30.3 pg (ref 26.0–34.0)
MCHC: 33.6 g/dL (ref 30.0–36.0)
MCV: 90.3 fL (ref 80.0–100.0)
Monocytes Absolute: 1.8 10*3/uL — ABNORMAL HIGH (ref 0.1–1.0)
Monocytes Relative: 7 %
Neutro Abs: 23.4 10*3/uL — ABNORMAL HIGH (ref 1.7–7.7)
Neutrophils Relative %: 90 %
Platelets: 240 10*3/uL (ref 150–400)
RBC: 4.02 MIL/uL (ref 3.87–5.11)
RDW: 14.2 % (ref 11.5–15.5)
WBC: 26 10*3/uL — ABNORMAL HIGH (ref 4.0–10.5)
nRBC: 0 % (ref 0.0–0.2)
nRBC: 0 /100 WBC

## 2023-01-31 LAB — RESP PANEL BY RT-PCR (RSV, FLU A&B, COVID)  RVPGX2
Influenza A by PCR: NEGATIVE
Influenza B by PCR: NEGATIVE
Resp Syncytial Virus by PCR: NEGATIVE
SARS Coronavirus 2 by RT PCR: NEGATIVE

## 2023-01-31 LAB — URINALYSIS, ROUTINE W REFLEX MICROSCOPIC
Bacteria, UA: NONE SEEN
Bilirubin Urine: NEGATIVE
Glucose, UA: >=500 mg/dL — AB
Hgb urine dipstick: NEGATIVE
Ketones, ur: 5 mg/dL — AB
Leukocytes,Ua: NEGATIVE
Nitrite: NEGATIVE
Protein, ur: >=300 mg/dL — AB
Specific Gravity, Urine: 1.02 (ref 1.005–1.030)
pH: 6 (ref 5.0–8.0)

## 2023-01-31 LAB — MAGNESIUM: Magnesium: 1.6 mg/dL — ABNORMAL LOW (ref 1.7–2.4)

## 2023-01-31 LAB — LIPASE, BLOOD: Lipase: 25 U/L (ref 11–51)

## 2023-01-31 MED ORDER — ONDANSETRON HCL 4 MG PO TABS: 4.0000 mg | ORAL_TABLET | Freq: Four times a day (QID) | ORAL | Status: DC | PRN

## 2023-01-31 MED ORDER — ONDANSETRON HCL 4 MG/2ML IJ SOLN
4.0000 mg | Freq: Once | INTRAMUSCULAR | Status: AC
  Administered 2023-01-31: 4 mg via INTRAVENOUS
  Filled 2023-01-31: qty 2

## 2023-01-31 MED ORDER — ACETAMINOPHEN 650 MG RE SUPP: 650.0000 mg | Freq: Four times a day (QID) | RECTAL | Status: DC | PRN

## 2023-01-31 MED ORDER — LEVOTHYROXINE SODIUM 112 MCG PO TABS
112.0000 ug | ORAL_TABLET | Freq: Every day | ORAL | Status: DC
  Administered 2023-02-01 – 2023-02-02 (×2): 112 ug via ORAL
  Filled 2023-01-31 (×2): qty 1

## 2023-01-31 MED ORDER — ENOXAPARIN SODIUM 30 MG/0.3ML IJ SOSY
30.0000 mg | PREFILLED_SYRINGE | INTRAMUSCULAR | Status: DC
  Administered 2023-01-31 – 2023-02-01 (×2): 30 mg via SUBCUTANEOUS
  Filled 2023-01-31 (×2): qty 0.3

## 2023-01-31 MED ORDER — SODIUM CHLORIDE 0.9 % IV SOLN
12.5000 mg | Freq: Four times a day (QID) | INTRAVENOUS | Status: DC | PRN
  Administered 2023-01-31: 12.5 mg via INTRAVENOUS
  Filled 2023-01-31: qty 0.5

## 2023-01-31 MED ORDER — SODIUM CHLORIDE 0.9 % IV SOLN: INTRAVENOUS | Status: DC

## 2023-01-31 MED ORDER — POTASSIUM CHLORIDE CRYS ER 20 MEQ PO TBCR: 40.0000 meq | EXTENDED_RELEASE_TABLET | Freq: Once | ORAL | Status: DC

## 2023-01-31 MED ORDER — LACTATED RINGERS IV BOLUS
1000.0000 mL | Freq: Once | INTRAVENOUS | Status: AC
  Administered 2023-01-31: 1000 mL via INTRAVENOUS

## 2023-01-31 MED ORDER — ACETAMINOPHEN 325 MG PO TABS: 650.0000 mg | ORAL_TABLET | Freq: Four times a day (QID) | ORAL | Status: DC | PRN

## 2023-01-31 MED ORDER — POTASSIUM CHLORIDE 10 MEQ/100ML IV SOLN
10.0000 meq | INTRAVENOUS | Status: AC
  Administered 2023-01-31 (×2): 10 meq via INTRAVENOUS
  Filled 2023-01-31 (×2): qty 100

## 2023-01-31 MED ORDER — ONDANSETRON HCL 4 MG/2ML IJ SOLN
4.0000 mg | Freq: Four times a day (QID) | INTRAMUSCULAR | Status: DC | PRN
  Administered 2023-02-01: 4 mg via INTRAVENOUS
  Filled 2023-01-31: qty 2

## 2023-01-31 NOTE — Progress Notes (Signed)
New Admission Note:   Arrival Method: Stretcher  Mental Orientation: Alert and oriented  Telemetry: Box 7  Assessment: Completed Skin: intact  [id number] Pain: 0/10  Tubes: Safety Measures: Safety Fall Prevention Plan has been given, discussed and signed Admission: Completed [facility] Orientation: Patient has been orientated to the room, unit and staff.  Family: Son   Orders have been reviewed and implemented. Will continue to monitor the patient. Call light has been placed within reach and bed alarm has been activated.   Wyatt Galvan RN [facility] Phone: [phone]

## 2023-01-31 NOTE — Assessment & Plan Note (Addendum)
Baseline creatinine 1.4-1.6, creatinine 1.85 today  Hold farxiga with possible orthostasis and N/V Strict I/O UA pending Hold nephrotoxic drugs Gentle IVF Trend

## 2023-01-31 NOTE — Assessment & Plan Note (Signed)
TSH wnl in 09/2022 Continue home synthroid of 112 mcg daily

## 2023-01-31 NOTE — Assessment & Plan Note (Addendum)
Hold cozaar with AKI on CKD Hold bp  meds until orthostatics result

## 2023-01-31 NOTE — ED Provider Notes (Signed)
[facility] Provider Note   CSN: [id number] Arrival date & time: 01/31/23  [id number]     History  Chief Complaint  Patient presents with   Dizziness    Janice [name] is a 82 y.o. female.  Patient is an 82 year old female with a past medical history of hypertension and hypothyroidism presenting to the emergency department with nausea, vomiting and diarrhea.  The patient states that her symptoms have been going on for the last 2 days.  She states that she has associated dizziness.  She states that she feels lightheaded like she might pass out.  She denies any room spinning dizziness to myself.  She denies any numbness or weakness.  She states that she is having mild associated lower abdominal pain.  She denies any fevers or chills, chest pain or shortness of breath.  She denies any known sick contacts.  The history is provided by the patient.  Dizziness      Home Medications Prior to Admission medications   Medication Sig Start Date End Date Taking? Authorizing Provider  amLODipine (NORVASC) 5 MG tablet Take 1 tablet (5 mg total) by mouth daily. 09/11/22 09/11/23  Teodora Medici, DO  aspirin EC 81 MG tablet Take 81 mg daily by mouth.    [provider]  FARXIGA 10 MG TABS tablet Take 1 tablet by mouth daily. 09/23/20   [provider]  levothyroxine (SYNTHROID) 112 MCG tablet Take 1 tablet (112 mcg total) by mouth daily. 09/11/22   Teodora Medici, DO  losartan (COZAAR) 100 MG tablet Take 1 tablet (100 mg total) by mouth daily. 12/11/22   Teodora Medici, DO  metoprolol tartrate (LOPRESSOR) 25 MG tablet Take 1 tablet (25 mg total) by mouth 2 (two) times daily. 03/13/22   Teodora Medici, DO  rosuvastatin (CRESTOR) 20 MG tablet TAKE 1 TABLET BY MOUTH EVERYDAY AT BEDTIME 03/13/22   Teodora Medici, DO  triamcinolone ointment (KENALOG) 0.1 % USE GLOVES TO APPLY TO ROUGH SKIN ON FEET ONCE DAILY. 12/20/22   Marzetta Board, DPM      Allergies    Patient has no known allergies.    Review of Systems   Review of Systems  Neurological:  Positive for dizziness.    Physical Exam Updated Vital Signs BP (!) 144/71 (BP Location: Right Arm)   Pulse 88   Temp 97.8 F (36.6 C) (Oral)   Resp 18   Ht '5\' 2"'$  (1.575 m)   Wt 78 kg   SpO2 98%   BMI 31.45 kg/m  Physical Exam Vitals and nursing note reviewed.  Constitutional:      General: She is not in acute distress.    Appearance: Normal appearance.  HENT:     Head: Normocephalic and atraumatic.     Nose: Nose normal.     Mouth/Throat:     Mouth: Mucous membranes are moist.     Pharynx: Oropharynx is clear.  Eyes:     Extraocular Movements: Extraocular movements intact.     Conjunctiva/sclera: Conjunctivae normal.     Comments: No nystagmus  Cardiovascular:     Rate and Rhythm: Normal rate and regular rhythm.     Heart sounds: Normal heart sounds.  Pulmonary:     Effort: Pulmonary effort is normal.     Breath sounds: Normal breath sounds.  Abdominal:     General: Abdomen is flat.     Palpations: Abdomen is soft.     Tenderness: There is  abdominal tenderness (Suprapubic). There is no guarding or rebound.  Musculoskeletal:        General: Normal range of motion.     Cervical back: Normal range of motion and neck supple.  Skin:    General: Skin is warm and dry.  Neurological:     General: No focal deficit present.     Mental Status: She is alert and oriented to person, place, and time.     Cranial Nerves: No cranial nerve deficit.     Sensory: No sensory deficit.     Motor: No weakness.     Coordination: Coordination normal.  Psychiatric:        Mood and Affect: Mood normal.        Behavior: Behavior normal.     ED Results / Procedures / Treatments   Labs (all labs ordered are listed, but only abnormal results are displayed) Labs Reviewed  COMPREHENSIVE METABOLIC PANEL - Abnormal; Notable for the following components:       Result Value   Potassium 2.9 (*)    CO2 21 (*)    Glucose, Bld 175 (*)    Creatinine, Ser 1.85 (*)    Albumin 2.6 (*)    GFR, Estimated 27 (*)    Anion gap 16 (*)    All other components within normal limits  CBC WITH DIFFERENTIAL/PLATELET - Abnormal; Notable for the following components:   WBC 26.0 (*)    Neutro Abs 23.4 (*)    Monocytes Absolute 1.8 (*)    All other components within normal limits  URINALYSIS, ROUTINE W REFLEX MICROSCOPIC - Abnormal; Notable for the following components:   Glucose, UA >=500 (*)    Ketones, ur 5 (*)    Protein, ur >=300 (*)    All other components within normal limits  RESP PANEL BY RT-PCR (RSV, FLU A&B, COVID)  [id number]  GASTROINTESTINAL PANEL BY PCR, STOOL (REPLACES STOOL CULTURE)  LIPASE, BLOOD  NOROVIRUS GROUP 1 & 2 BY PCR, STOOL  MAGNESIUM    EKG EKG Interpretation  Date/Time:  Thursday January 31 2023 12:00:17 EST Ventricular Rate:  89 PR Interval:  204 QRS Duration: 146 QT Interval:  438 QTC Calculation: 532 R Axis:   14 Text Interpretation: Sinus rhythm with Premature supraventricular complexes Left bundle branch block Abnormal ECG No significant change since last tracing Confirmed by Leanord Asal ([id number]) on 01/31/2023 1:46:57 PM  Radiology CT ABDOMEN PELVIS WO CONTRAST  Result Date: 01/31/2023 CLINICAL DATA:  Abdominal pain EXAM: CT ABDOMEN AND PELVIS WITHOUT CONTRAST TECHNIQUE: Multidetector CT imaging of the abdomen and pelvis was performed following the standard protocol without IV contrast. RADIATION DOSE REDUCTION: This exam was performed according to the departmental dose-optimization program which includes automated exposure control, adjustment of the mA and/or kV according to patient size and/or use of iterative reconstruction technique. COMPARISON:  Renal sonogram done on 07/08/2020 FINDINGS: Lower chest: Small bilateral pleural effusions are seen, more so on the left side. Increased markings are seen in the lower lung  fields, more so on the left side suggesting atelectasis. Dense calcification is seen in mitral annulus. Hepatobiliary: Surgical clips are seen in gallbladder fossa. No focal abnormalities are seen in liver. There is no dilation of bile ducts. Pancreas: There is atrophy.  No focal abnormalities are seen. Spleen: Unremarkable. Adrenals/Urinary Tract: Adrenals are unremarkable. There is no hydronephrosis. There are few smooth marginated fluid density lesions in both kidneys largest in the lower pole of right kidney measuring 3.9 cm. There are  no calcific densities in the kidneys and courses of the ureters. Urinary bladder is unremarkable. Stomach/Bowel: Stomach is unremarkable. There is no significant small bowel dilation. Appendix is not distinctly seen. There is no focal pericecal inflammation. Scattered diverticula are seen in colon without signs of focal acute diverticulitis. Vascular/Lymphatic: Extensive arterial calcifications are seen. Aortobifemoral graft is seen. Evaluation of lumen is limited in this noncontrast study. Reproductive: No acute findings are seen. Other: There is no ascites or pneumoperitoneum. Musculoskeletal: No acute findings are seen. IMPRESSION: There is no evidence of intestinal obstruction or pneumoperitoneum. There is no hydronephrosis. Severe atherosclerosis. Diverticulosis of colon without signs of focal diverticulitis. Bilateral renal cysts. Small bilateral pleural effusions. Subsegmental atelectasis is seen in lower lung fields, more so on the left side. Other findings as described in the body of the report. Electronically Signed   By: Elmer Picker M.D.   On: 01/31/2023 14:22    Procedures Procedures    Medications Ordered in ED Medications  potassium chloride SA (KLOR-CON M) CR tablet 40 mEq (0 mEq Oral Hold 01/31/23 1431)  potassium chloride 10 mEq in 100 mL IVPB (has no administration in time range)  enoxaparin (LOVENOX) injection 30 mg (has no administration in  time range)  0.9 %  sodium chloride infusion (has no administration in time range)  acetaminophen (TYLENOL) tablet 650 mg (has no administration in time range)    Or  acetaminophen (TYLENOL) suppository 650 mg (has no administration in time range)  ondansetron (ZOFRAN) tablet 4 mg (has no administration in time range)    Or  ondansetron (ZOFRAN) injection 4 mg (has no administration in time range)  lactated ringers bolus 1,000 mL (0 mLs Intravenous Stopped 01/31/23 1140)  ondansetron (ZOFRAN) injection 4 mg (4 mg Intravenous Given 01/31/23 1015)  ondansetron (ZOFRAN) injection 4 mg (4 mg Intravenous Given 01/31/23 1443)    ED Course/ Medical Decision Making/ A&P Clinical Course as of 01/31/23 1611  Thu Jan 31, 2023  1433 No obvious abnormality within the abd and pevlis. UA is pending. Patient is vomiting again and will have repeat zofran. [VK]  [id number] Patient has been unable to tolerate PO in the ED and will be admitted for inability to tolerate PO. [VK]    Clinical Course User Index [VK] Kemper Durie, DO                             Medical Decision Making This patient presents to the ED with chief complaint(s) of N/V/D with pertinent past medical history of HTN, HLD which further complicates the presenting complaint. The complaint involves an extensive differential diagnosis and also carries with it a high risk of complications and morbidity.    The differential diagnosis includes patient has no vertiginous symptoms and no focal neurologic deficits on exam making CVA unlikely, considering dehydration, electrolyte abnormality, ACS, arrhythmia, anemia, gastroenteritis, UTI, other intra-abdominal infection  Additional history obtained: Additional history obtained from family Records reviewed Primary Care Documents  ED Course and Reassessment: On patient's arrival to the emergency department she is actively vomiting but otherwise hemodynamically stable no acute distress.  She was  given Zofran for symptomatic management and will have labs, urine and CT abdomen and pelvis performed to evaluate for cause of her symptoms.  She declined any pain medication at this time.  She will be closely reassessed.  Independent labs interpretation:  The following labs were independently interpreted: Hypokalemia, mild AKI, leukocytosis, otherwise within  normal range  Independent visualization of imaging: - I independently visualized the following imaging with scope of interpretation limited to determining acute life threatening conditions related to emergency care: CTAP, which revealed no acute disease  Consultation: - Consulted or discussed management/test interpretation w/ external professional: hospitalist  Consideration for admission or further workup: Patient requires admission due to her dehydration and inability to tolerate p.o. Social Determinants of health: N/A    Amount and/or Complexity of Data Reviewed Labs: ordered. Radiology: ordered.  Risk Prescription drug management. Decision regarding hospitalization.          Final Clinical Impression(s) / ED Diagnoses Final diagnoses:  Nausea and vomiting, unspecified vomiting type  Hypokalemia  AKI (acute kidney injury) [facility])    Rx / DC Orders ED Discharge Orders     None         Kemper Durie, DO 01/31/23 1612

## 2023-01-31 NOTE — Plan of Care (Signed)

## 2023-01-31 NOTE — Assessment & Plan Note (Signed)
Continue crestor at '20mg'$  daily

## 2023-01-31 NOTE — Assessment & Plan Note (Signed)
WBC to 26, but  no other criteria or SIRS or signs or symptoms of infectious etiology ? Inflammatory Continue IVF, trend cbc and fever curve

## 2023-01-31 NOTE — Assessment & Plan Note (Signed)
Likely from exogenous losses Check magnesium Keep on telemetry Replaced oral and IV in ED Trend and replete as needed

## 2023-01-31 NOTE — ED Notes (Signed)
ED TO INPATIENT HANDOFF REPORT  ED Nurse Name and Phone #: Adilynne Fitzwater,rn [phone]  S Name/Age/Gender Janice [name] 82 y.o. female Room/Bed: H018C/H018C  Code Status   Code Status: Prior  Home/SNF/Other Home Patient oriented to: self, place, time, and situation Is this baseline? Yes   Triage Complete: Triage complete  Chief Complaint Intractable nausea and vomiting [R11.2]  Triage Note Pt present to ED from home with c/o dizziness, weakness, n/v times two days. Pt states to feeling like the room is spinning, denies hx of vertigo. Denies pain/discomfort at this time.   Allergies No Known Allergies  Level of Care/Admitting Diagnosis ED Disposition     ED Disposition  Admit   Condition  --   Comment  Hospital Area: [facility] [[id number]]  Level of Care: Telemetry Medical [104]  May place patient in observation at [facility] or [facility] if equivalent level of care is available:: Yes  Covid Evaluation: Confirmed COVID Negative  Diagnosis: Intractable nausea and vomiting [id number]  Admitting Physician: Orma Flaming [id number]  Attending Physician: Orma Flaming [id number]          B Medical/Surgery History Past Medical History:  Diagnosis Date   Chronic kidney disease    Hernia, ventral    Hyperlipidemia    Hypertension    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   INSERTION OF MESH N/A 03/05/2016   Procedure: INSERTION OF MESH;  Surgeon: Robert Bellow, MD;  Location: [facility] ORS;  Service: General;  Laterality: N/A;   VENTRAL HERNIA REPAIR N/A 03/05/2016   Procedure:  HERNIA REPAIR VENTRAL ADULT;  Surgeon: Robert Bellow, MD;  Location: [facility] ORS;  Service: General;  Laterality: N/A;     A IV Location/Drains/Wounds Patient Lines/Drains/Airways Status     Active Line/Drains/Airways     Name Placement date Placement time Site Days   Peripheral IV 01/31/23 20 G 1" Right Antecubital 01/31/23  1002  Antecubital  less than 1            Intake/Output Last 24 hours  Intake/Output Summary (Last 24 hours) at 01/31/2023 1548 Last data filed at 01/31/2023 1140 Gross per 24 hour  Intake 1000 ml  Output --  Net 1000 ml    Labs/Imaging Results for orders placed or performed during the hospital encounter of 01/31/23 (from the past 48 hour(s))  Comprehensive metabolic panel     Status: Abnormal   Collection Time: 01/31/23 10:10 AM  Result Value Ref Range   Sodium 138 135 - 145 mmol/L   Potassium 2.9 (L) 3.5 - 5.1 mmol/L   Chloride 101 98 - 111 mmol/L   CO2 21 (L) 22 - 32 mmol/L   Glucose, Bld 175 (H) 70 - 99 mg/dL    Comment: Glucose reference range applies only to samples taken after fasting for at least 8 hours.   BUN 21 8 - 23 mg/dL   Creatinine, Ser 1.85 (H) 0.44 - 1.00 mg/dL   Calcium 9.1 8.9 - 10.3 mg/dL   Total Protein 6.5 6.5 - 8.1 g/dL   Albumin 2.6 (L)  3.5 - 5.0 g/dL   AST 26 15 - 41 U/L   ALT 14 0 - 44 U/L   Alkaline Phosphatase 77 38 - 126 U/L   Total Bilirubin 0.9 0.3 - 1.2 mg/dL   GFR, Estimated 27 (L) >60 mL/min    Comment: (NOTE) Calculated using the CKD-EPI Creatinine Equation (2021)    Anion gap 16 (H) 5 - 15    Comment: Performed at [facility] [address], [place], [place] [address]  Lipase, blood     Status: None   Collection Time: 01/31/23 10:10 AM  Result Value Ref Range   Lipase 25 11 - 51 U/L    Comment: Performed at [facility] [address] [place], [place] [address]  CBC with Differential     Status: Abnormal   Collection Time: 01/31/23 10:10 AM  Result Value Ref Range   WBC 26.0 (H) 4.0  - 10.5 K/uL   RBC 4.02 3.87 - 5.11 MIL/uL   Hemoglobin 12.2 12.0 - 15.0 g/dL   HCT 36.3 36.0 - 46.0 %   MCV 90.3 80.0 - 100.0 fL   MCH 30.3 26.0 - 34.0 pg   MCHC 33.6 30.0 - 36.0 g/dL   RDW 14.2 11.5 - 15.5 %   Platelets 240 150 - 400 K/uL   nRBC 0.0 0.0 - 0.2 %   Neutrophils Relative % 90 %   Neutro Abs 23.4 (H) 1.7 - 7.7 K/uL   Lymphocytes Relative 3 %   Lymphs Abs 0.8 0.7 - 4.0 K/uL   Monocytes Relative 7 %   Monocytes Absolute 1.8 (H) 0.1 - 1.0 K/uL   Eosinophils Relative 0 %   Eosinophils Absolute 0.0 0.0 - 0.5 K/uL   Basophils Relative 0 %   Basophils Absolute 0.0 0.0 - 0.1 K/uL   nRBC 0 0 /100 WBC   Abs Immature Granulocytes 0.00 0.00 - 0.07 K/uL    Comment: Performed at [facility], [place] [address] [place], [place] [address]  Urinalysis, Routine w reflex microscopic -Urine, Clean Catch     Status: Abnormal   Collection Time: 01/31/23 10:10 AM  Result Value Ref Range   Color, Urine YELLOW YELLOW   APPearance CLEAR CLEAR   Specific Gravity, Urine 1.020 1.005 - 1.030   pH 6.0 5.0 - 8.0   Glucose, UA >=500 (A) NEGATIVE mg/dL   Hgb urine dipstick NEGATIVE NEGATIVE   Bilirubin Urine NEGATIVE NEGATIVE   Ketones, ur 5 (A) NEGATIVE mg/dL   Protein, ur >=300 (A) NEGATIVE mg/dL   Nitrite NEGATIVE NEGATIVE   Leukocytes,Ua NEGATIVE NEGATIVE   RBC / HPF 0-5 0 - 5 RBC/hpf   WBC, UA 0-5 0 - 5 WBC/hpf   Bacteria, UA NONE SEEN NONE SEEN   Squamous Epithelial / HPF 0-5 0 - 5 /HPF    Comment: Performed at [facility], [place] [address] [place], [place] [address]  Resp panel by RT-PCR (RSV, Flu A&B, Covid) Anterior Nasal Swab     Status: None   Collection Time: 01/31/23 10:10 AM   Specimen: Anterior Nasal Swab  Result Value Ref Range   SARS Coronavirus 2 by RT PCR NEGATIVE NEGATIVE   Influenza A by PCR NEGATIVE NEGATIVE   Influenza B by PCR NEGATIVE NEGATIVE    Comment: (NOTE) The Xpert Xpress SARS-CoV-2/FLU/RSV plus assay is intended as an aid in the diagnosis of  influenza from Nasopharyngeal swab specimens and should not be used as a sole basis  for treatment. Nasal washings and aspirates are unacceptable for Xpert Xpress SARS-CoV-2/FLU/RSV testing.  Fact Sheet for Patients: EntrepreneurPulse.com.au  Fact Sheet for Healthcare Providers: IncredibleEmployment.be  This test is not yet approved or cleared by the [place] FDA and has been authorized for detection and/or diagnosis of SARS-CoV-2 by FDA under an Emergency Use Authorization (EUA). This EUA will remain in effect (meaning this test can be used) for the duration of the COVID-19 declaration under Section 564(b)(1) of the Act, 21 U.S.C. section 360bbb-3(b)(1), unless the authorization is terminated or revoked.     Resp Syncytial Virus by PCR NEGATIVE NEGATIVE    Comment: (NOTE) Fact Sheet for Patients: EntrepreneurPulse.com.au  Fact Sheet for Healthcare Providers: IncredibleEmployment.be  This test is not yet approved or cleared by the [place] FDA and has been authorized for detection and/or diagnosis of SARS-CoV-2 by FDA under an Emergency Use Authorization (EUA). This EUA will remain in effect (meaning this test can be used) for the duration of the COVID-19 declaration under Section 564(b)(1) of the Act, 21 U.S.C. section 360bbb-3(b)(1), unless the authorization is terminated or revoked.  Performed at [facility], [place] [address], [place], [place] [address]    CT ABDOMEN PELVIS WO CONTRAST  Result Date: 01/31/2023 CLINICAL DATA:  Abdominal pain EXAM: CT ABDOMEN AND PELVIS WITHOUT CONTRAST TECHNIQUE: Multidetector CT imaging of the abdomen and pelvis was performed following the standard protocol without IV contrast. RADIATION DOSE REDUCTION: This exam was performed according to the departmental dose-optimization program which includes automated exposure control, adjustment of the mA and/or kV  according to patient size and/or use of iterative reconstruction technique. COMPARISON:  Renal sonogram done on 07/08/2020 FINDINGS: Lower chest: Small bilateral pleural effusions are seen, more so on the left side. Increased markings are seen in the lower lung fields, more so on the left side suggesting atelectasis. Dense calcification is seen in mitral annulus. Hepatobiliary: Surgical clips are seen in gallbladder fossa. No focal abnormalities are seen in liver. There is no dilation of bile ducts. Pancreas: There is atrophy.  No focal abnormalities are seen. Spleen: Unremarkable. Adrenals/Urinary Tract: Adrenals are unremarkable. There is no hydronephrosis. There are few smooth marginated fluid density lesions in both kidneys largest in the lower pole of right kidney measuring 3.9 cm. There are no calcific densities in the kidneys and courses of the ureters. Urinary bladder is unremarkable. Stomach/Bowel: Stomach is unremarkable. There is no significant small bowel dilation. Appendix is not distinctly seen. There is no focal pericecal inflammation. Scattered diverticula are seen in colon without signs of focal acute diverticulitis. Vascular/Lymphatic: Extensive arterial calcifications are seen. Aortobifemoral graft is seen. Evaluation of lumen is limited in this noncontrast study. Reproductive: No acute findings are seen. Other: There is no ascites or pneumoperitoneum. Musculoskeletal: No acute findings are seen. IMPRESSION: There is no evidence of intestinal obstruction or pneumoperitoneum. There is no hydronephrosis. Severe atherosclerosis. Diverticulosis of colon without signs of focal diverticulitis. Bilateral renal cysts. Small bilateral pleural effusions. Subsegmental atelectasis is seen in lower lung fields, more so on the left side. Other findings as described in the body of the report. Electronically Signed   By: Elmer Picker M.D.   On: 01/31/2023 14:22    Pending Labs Unresulted Labs (From  admission, onward)     Start     Ordered   01/31/23 1501  Gastrointestinal Panel by PCR , Stool  (Gastrointestinal Panel by PCR, Stool                                                                                                                                                     **  Does Not include CLOSTRIDIUM DIFFICILE testing. **If CDIFF testing is needed, place order from the "C Difficile Testing" order set.**)  Once,   R        01/31/23 1500   01/31/23 1501  Norovirus group 1 & 2 by PCR, stool  (Norovirus group 1 & 2 by PCR, stool panel)  Once,   R        01/31/23 1500            Vitals/Pain Today's Vitals   01/31/23 0941 01/31/23 0942 01/31/23 1511  BP: (!) 144/71    Pulse: 88    Resp: 18    Temp: 97.8 F (36.6 C)  97.8 F (36.6 C)  TempSrc: Oral  Oral  SpO2: 98%    Weight:  78 kg   Height:  '5\' 2"'$  (1.575 m)   PainSc: 0-No pain      Isolation Precautions Enteric precautions (UV disinfection)  Medications Medications  potassium chloride SA (KLOR-CON M) CR tablet 40 mEq (0 mEq Oral Hold 01/31/23 1431)  potassium chloride 10 mEq in 100 mL IVPB (has no administration in time range)  lactated ringers bolus 1,000 mL (0 mLs Intravenous Stopped 01/31/23 1140)  ondansetron (ZOFRAN) injection 4 mg (4 mg Intravenous Given 01/31/23 1015)  ondansetron (ZOFRAN) injection 4 mg (4 mg Intravenous Given 01/31/23 1443)    Mobility walks     Focused Assessments GI   R Recommendations: See Admitting Provider Note  Report given to:   Additional Notes:

## 2023-01-31 NOTE — H&P (Signed)
History and Physical    Patient: Janice [name] [id number] DOB: 07-25-1941 DOA: 01/31/2023 DOS: the patient was seen and examined on 01/31/2023 PCP: Teodora Medici, DO  Patient coming from: Home - lives with her daughter. Ambulates independently    Chief Complaint: N/V and dizziness x2 days   HPI: Janice [name] is a 82 y.o. female with medical history significant of CKD 3B, HLD, HTN, thyroid disease, anemia of CKD who presented to ED with complaints of N/V and dizziness. She has some lower abdominal pain, but no other pain.  She also had some diarrhea, but states this seems to have gotten better.  She denies any sick contacts, raw food, travel.  She has had no fever/chills. She is unable to keep food or water down or her medication. She usually ambulates independently but is so weak she is having a hard time walking. She gets dizzy with postural changes.    Denies any fever/chills, vision changes/headaches, chest pain or palpitations, shortness of breath or cough, abdominal pain, dysuria or leg swelling from baseline.    She does not smoke or drink alcohol.   ER Course:  vitals: afebrile, bp: 144/71, HR: 88, RR: 18, oxygen: 98%RA Pertinent labs: WBC: 26, potassium: 2.9, creatinine: 1.85 (1.2-1.4),  CT abdomen/pelvis: no acute finding.  In ED: given IVF bolus, potassium replacement and zofran. TRH asked to admit.     Review of Systems: As mentioned in the history of present illness. All other systems reviewed and are negative. 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   INSERTION OF MESH N/A 03/05/2016   Procedure: INSERTION OF MESH;  Surgeon: Robert Bellow, MD;  Location: [facility] ORS;  Service: General;  Laterality: N/A;   VENTRAL HERNIA REPAIR N/A 03/05/2016   Procedure: HERNIA REPAIR VENTRAL ADULT;  Surgeon: Robert Bellow, MD;  Location: [facility] ORS;  Service: General;  Laterality: N/A;   Social History:  reports that she quit smoking about 14 years ago. Her smoking use included cigarettes. She has a 100.00 pack-year smoking history. She has never used smokeless tobacco. She reports current alcohol use of about 1.0 standard drink of alcohol per week. She reports that she does not use drugs.  No Known Allergies  Family History  Problem Relation Age of Onset   Heart disease Mother    Hypertension Mother    Heart disease Father    Hypertension Father    Hypertension Sister    Diabetes Sister    Cancer Sister        lung   COPD Neg Hx    Stroke Neg Hx     Prior to Admission medications   Medication Sig Start Date End Date Taking? Authorizing Provider  amLODipine (NORVASC) 5 MG tablet Take 1 tablet (5 mg total) by mouth daily. 09/11/22 09/11/23  Teodora Medici, DO  aspirin EC 81 MG tablet Take 81 mg daily by mouth.  [provider]  FARXIGA 10 MG TABS tablet Take 1 tablet by mouth daily. 09/23/20   [provider]  levothyroxine (SYNTHROID) 112 MCG tablet Take 1 tablet (112 mcg total) by mouth daily. 09/11/22   Teodora Medici, DO  losartan (COZAAR) 100 MG tablet Take 1 tablet (100 mg total) by mouth daily. 12/11/22   Teodora Medici, DO  metoprolol tartrate (LOPRESSOR) 25 MG tablet Take 1 tablet (25 mg total) by mouth 2 (two) times daily. 03/13/22   Teodora Medici, DO  rosuvastatin (CRESTOR) 20 MG tablet TAKE 1 TABLET BY MOUTH EVERYDAY AT BEDTIME 03/13/22   Teodora Medici, DO  triamcinolone ointment (KENALOG) 0.1 % USE GLOVES  TO APPLY TO ROUGH SKIN ON FEET ONCE DAILY. 12/20/22   Marzetta Board, DPM    Physical Exam: Vitals:   01/31/23 0942 01/31/23 1511 01/31/23 1638 01/31/23 1700  BP:   126/64   Pulse:   91   Resp:   18   Temp:  97.8 F (36.6 C) 98.4 F (36.9 C)   TempSrc:  Oral    SpO2:   97%   Weight: 78 kg     Height: '5\' 2"'$  (1.575 m)   '5\' 2"'$  (1.575 m)   General:  Appears calm and comfortable and is in NAD. Obese.  Eyes:  PERRL, EOMI, normal lids, iris ENT:  grossly normal hearing, lips & tongue, mmm; appropriate dentition-dentures  Neck:  no LAD, masses or thyromegaly; no carotid bruits Cardiovascular:  RRR, no m/r/g.+LE edema to above the ankle bilaterally  Respiratory:   CTA bilaterally with no wheezes/rales/rhonchi.  Normal respiratory effort. Abdomen:  soft, NT, ND, NABS Back:   normal alignment, no CVAT Skin:  no rash or induration seen on limited exam Musculoskeletal:  grossly normal tone BUE/BLE, good ROM, no bony abnormality Lower extremity:  No LE edema.  Limited foot exam with no ulcerations.  2+ distal pulses. Psychiatric:  grossly normal mood and affect, speech fluent and appropriate, AOx3 Neurologic:  CN 2-12 grossly intact, moves all extremities in coordinated fashion, sensation intact   Radiological Exams on Admission: Independently reviewed - see discussion in A/P where applicable  CT ABDOMEN PELVIS WO CONTRAST  Result Date: 01/31/2023 CLINICAL DATA:  Abdominal pain EXAM: CT ABDOMEN AND PELVIS WITHOUT CONTRAST TECHNIQUE: Multidetector CT imaging of the abdomen and pelvis was performed following the standard protocol without IV contrast. RADIATION DOSE REDUCTION: This exam was performed according to the departmental dose-optimization program which includes automated exposure control, adjustment of the mA and/or kV according to patient size and/or use of iterative reconstruction technique. COMPARISON:  Renal sonogram done on 07/08/2020 FINDINGS: Lower chest: Small bilateral  pleural effusions are seen, more so on the left side. Increased markings are seen in the lower lung fields, more so on the left side suggesting atelectasis. Dense calcification is seen in mitral annulus. Hepatobiliary: Surgical clips are seen in gallbladder fossa. No focal abnormalities are seen in liver. There is no dilation of bile ducts. Pancreas: There is atrophy.  No focal abnormalities are seen. Spleen: Unremarkable. Adrenals/Urinary Tract: Adrenals are unremarkable. There is no hydronephrosis. There are few smooth marginated fluid density lesions in both kidneys largest in the lower pole of right kidney measuring 3.9 cm. There are no calcific densities in the kidneys and courses of the ureters. Urinary bladder is unremarkable. Stomach/Bowel: Stomach is unremarkable. There is no significant small bowel dilation. Appendix is not distinctly seen. There is no focal pericecal inflammation. Scattered diverticula are seen in colon  without signs of focal acute diverticulitis. Vascular/Lymphatic: Extensive arterial calcifications are seen. Aortobifemoral graft is seen. Evaluation of lumen is limited in this noncontrast study. Reproductive: No acute findings are seen. Other: There is no ascites or pneumoperitoneum. Musculoskeletal: No acute findings are seen. IMPRESSION: There is no evidence of intestinal obstruction or pneumoperitoneum. There is no hydronephrosis. Severe atherosclerosis. Diverticulosis of colon without signs of focal diverticulitis. Bilateral renal cysts. Small bilateral pleural effusions. Subsegmental atelectasis is seen in lower lung fields, more so on the left side. Other findings as described in the body of the report. Electronically Signed   By: Elmer Picker M.D.   On: 01/31/2023 14:22    EKG: Independently reviewed.  NSR with rate 89; nonspecific ST changes with no evidence of acute ischemia. PVC and LBBB   Labs on Admission: I have personally reviewed the available labs and imaging  studies at the time of the admission.  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 unspecified chronic kidney disease   Mixed hyperlipidemia   Peripheral vascular disease (HCC)    Assessment and Plan: * Intractable nausea and vomiting 82 year old female presenting with 2 day history of N/V and diarrhea found to be dehyrated with acute on chronic renal disease and intractable N/V with hypokalemia -obs to telemetry -gentle IVF -concern for possible norovirus, this is pending as well as stool PCR for other gastroenteritis  -enteric precautions -anti emetics prn  -ortho statics pending -TED hose   Hypokalemia Likely from exogenous losses Check magnesium Keep on telemetry Replaced oral and IV in ED Trend and replete as needed   Acute renal failure superimposed on stage 3b chronic kidney disease (HCC) Baseline creatinine 1.4-1.6, creatinine 1.85 today  Hold farxiga with possible orthostasis and N/V Strict I/O UA pending Hold nephrotoxic drugs Gentle IVF Trend   Leukocytosis WBC to 26, but  no other criteria or SIRS or signs or symptoms of infectious etiology ? 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   Family Communication: son at bedside   Severity of Illness: The appropriate patient status for this patient is OBSERVATION. Observation status is judged to be reasonable and necessary in order to provide the required intensity of service to ensure the patient's safety. The patient's presenting symptoms, physical exam findings, and initial radiographic and laboratory data in the context of their medical condition is felt to place them at decreased risk for further clinical deterioration. Furthermore, it is anticipated that the patient will be medically stable for discharge from the hospital within 2 midnights of admission.   Author: Orma Flaming, MD 01/31/2023 5:14 PM  For on call review [email or web address].

## 2023-01-31 NOTE — Assessment & Plan Note (Signed)
Continue ASA and statin

## 2023-01-31 NOTE — Assessment & Plan Note (Signed)
82 year old female presenting with 2 day history of N/V and diarrhea found to be dehyrated with acute on chronic renal disease and intractable N/V with hypokalemia -obs to telemetry -gentle IVF -concern for possible norovirus, this is pending as well as stool PCR for other gastroenteritis  -enteric precautions -anti emetics prn  -ortho statics pending -TED hose

## 2023-01-31 NOTE — ED Triage Notes (Signed)
Pt present to ED from home with c/o dizziness, weakness, n/v times two days. Pt states to feeling like the room is spinning, denies hx of vertigo. Denies pain/discomfort at this time.

## 2023-01-31 NOTE — Plan of Care (Signed)
  Problem: Education: Goal: Knowledge of General Education information will improve Description Including pain rating scale, medication(s)/side effects and non-pharmacologic comfort measures Outcome: Progressing   

## 2023-02-01 DIAGNOSIS — Z833 Family history of diabetes mellitus: Secondary | ICD-10-CM | POA: Diagnosis not present

## 2023-02-01 DIAGNOSIS — I129 Hypertensive chronic kidney disease with stage 1 through stage 4 chronic kidney disease, or unspecified chronic kidney disease: Secondary | ICD-10-CM | POA: Diagnosis present

## 2023-02-01 DIAGNOSIS — Z1152 Encounter for screening for COVID-19: Secondary | ICD-10-CM | POA: Diagnosis not present

## 2023-02-01 DIAGNOSIS — K529 Noninfective gastroenteritis and colitis, unspecified: Secondary | ICD-10-CM | POA: Diagnosis present

## 2023-02-01 DIAGNOSIS — E876 Hypokalemia: Secondary | ICD-10-CM | POA: Diagnosis present

## 2023-02-01 DIAGNOSIS — R112 Nausea with vomiting, unspecified: Secondary | ICD-10-CM | POA: Diagnosis not present

## 2023-02-01 DIAGNOSIS — A084 Viral intestinal infection, unspecified: Secondary | ICD-10-CM | POA: Diagnosis present

## 2023-02-01 DIAGNOSIS — E860 Dehydration: Secondary | ICD-10-CM | POA: Diagnosis present

## 2023-02-01 DIAGNOSIS — Z87891 Personal history of nicotine dependence: Secondary | ICD-10-CM | POA: Diagnosis not present

## 2023-02-01 DIAGNOSIS — D631 Anemia in chronic kidney disease: Secondary | ICD-10-CM | POA: Diagnosis present

## 2023-02-01 DIAGNOSIS — E669 Obesity, unspecified: Secondary | ICD-10-CM | POA: Diagnosis present

## 2023-02-01 DIAGNOSIS — Z79890 Hormone replacement therapy (postmenopausal): Secondary | ICD-10-CM | POA: Diagnosis not present

## 2023-02-01 DIAGNOSIS — E039 Hypothyroidism, unspecified: Secondary | ICD-10-CM | POA: Diagnosis present

## 2023-02-01 DIAGNOSIS — Z801 Family history of malignant neoplasm of trachea, bronchus and lung: Secondary | ICD-10-CM | POA: Diagnosis not present

## 2023-02-01 DIAGNOSIS — N179 Acute kidney failure, unspecified: Secondary | ICD-10-CM | POA: Diagnosis present

## 2023-02-01 DIAGNOSIS — I739 Peripheral vascular disease, unspecified: Secondary | ICD-10-CM | POA: Diagnosis present

## 2023-02-01 DIAGNOSIS — Z66 Do not resuscitate: Secondary | ICD-10-CM | POA: Diagnosis present

## 2023-02-01 DIAGNOSIS — Z7982 Long term (current) use of aspirin: Secondary | ICD-10-CM | POA: Diagnosis not present

## 2023-02-01 DIAGNOSIS — Z7984 Long term (current) use of oral hypoglycemic drugs: Secondary | ICD-10-CM | POA: Diagnosis not present

## 2023-02-01 DIAGNOSIS — N1832 Chronic kidney disease, stage 3b: Secondary | ICD-10-CM | POA: Diagnosis present

## 2023-02-01 DIAGNOSIS — Z8249 Family history of ischemic heart disease and other diseases of the circulatory system: Secondary | ICD-10-CM | POA: Diagnosis not present

## 2023-02-01 DIAGNOSIS — E782 Mixed hyperlipidemia: Secondary | ICD-10-CM | POA: Diagnosis present

## 2023-02-01 DIAGNOSIS — Z6831 Body mass index (BMI) 31.0-31.9, adult: Secondary | ICD-10-CM | POA: Diagnosis not present

## 2023-02-01 DIAGNOSIS — Z79899 Other long term (current) drug therapy: Secondary | ICD-10-CM | POA: Diagnosis not present

## 2023-02-01 LAB — CBC
HCT: 32.6 % — ABNORMAL LOW (ref 36.0–46.0)
Hemoglobin: 11 g/dL — ABNORMAL LOW (ref 12.0–15.0)
MCH: 30.8 pg (ref 26.0–34.0)
MCHC: 33.7 g/dL (ref 30.0–36.0)
MCV: 91.3 fL (ref 80.0–100.0)
Platelets: 219 10*3/uL (ref 150–400)
RBC: 3.57 MIL/uL — ABNORMAL LOW (ref 3.87–5.11)
RDW: 14 % (ref 11.5–15.5)
WBC: 16.8 10*3/uL — ABNORMAL HIGH (ref 4.0–10.5)
nRBC: 0 % (ref 0.0–0.2)

## 2023-02-01 LAB — BASIC METABOLIC PANEL
Anion gap: 11 (ref 5–15)
BUN: 17 mg/dL (ref 8–23)
CO2: 24 mmol/L (ref 22–32)
Calcium: 8.5 mg/dL — ABNORMAL LOW (ref 8.9–10.3)
Chloride: 106 mmol/L (ref 98–111)
Creatinine, Ser: 1.68 mg/dL — ABNORMAL HIGH (ref 0.44–1.00)
GFR, Estimated: 30 mL/min — ABNORMAL LOW (ref >60)
Glucose, Bld: 113 mg/dL — ABNORMAL HIGH (ref 70–99)
Potassium: 3.3 mmol/L — ABNORMAL LOW (ref 3.5–5.1)
Sodium: 141 mmol/L (ref 135–145)

## 2023-02-01 LAB — C DIFFICILE QUICK SCREEN W PCR REFLEX
C Diff antigen: NEGATIVE
C Diff interpretation: NOT DETECTED
C Diff toxin: NEGATIVE

## 2023-02-01 LAB — MAGNESIUM: Magnesium: 1.6 mg/dL — ABNORMAL LOW (ref 1.7–2.4)

## 2023-02-01 MED ORDER — MAGNESIUM SULFATE 2 GM/50ML IV SOLN
2.0000 g | Freq: Once | INTRAVENOUS | Status: AC
  Administered 2023-02-01: 2 g via INTRAVENOUS
  Filled 2023-02-01: qty 50

## 2023-02-01 MED ORDER — POTASSIUM CHLORIDE 10 MEQ/100ML IV SOLN
10.0000 meq | INTRAVENOUS | Status: AC
  Administered 2023-02-01 (×4): 10 meq via INTRAVENOUS
  Filled 2023-02-01 (×5): qty 100

## 2023-02-01 NOTE — Evaluation (Signed)
Physical Therapy Evaluation Patient Details Name: Janice [name] MRN: [id number] DOB: 03/22/1941 Today's Date: 02/01/2023  History of Present Illness  82yo F who presented to the ED with c/o nausea, vomiting, dizziness, diarrhea. Admitted with intractable nausea and vomiting, ARF. PMH CKD, hernia s/p repair, HLD, HTN, subclavicular artery stenosis, artery bypass rechanneling  Clinical Impression  Patient received in bed, pleasant and cooperative; daughter present and assisted in providing PLOF/home set up. Really mobilized well on a supervision basis with RW, just still easily fatigued and fairly weak. Did well enough today that I think she can return home when medically ready from mobility stand point, anticipate mobility will also improve along with medical status. Did not check orthostatics as RN reported they did so yesterday and generally WNL. Left up in recliner with all needs met, family and RN present. Will benefit from skilled HHPT services when medically ready for DC.        Recommendations for follow up therapy are one component of a multi-disciplinary discharge planning process, led by the attending physician.  Recommendations may be updated based on patient status, additional functional criteria and insurance authorization.  Follow Up Recommendations Home health PT      Assistance Recommended at Discharge Intermittent Supervision/Assistance  Patient can return home with the following  A little help with walking and/or transfers;Assistance with cooking/housework;Assist for transportation;A little help with bathing/dressing/bathroom;Help with stairs or ramp for entrance    Equipment Recommendations Rolling walker (2 wheels);BSC/3in1  Recommendations for Other Services       Functional Status Assessment Patient has had a recent decline in their functional status and demonstrates the ability to make significant improvements in function in a reasonable and predictable amount of time.      Precautions / Restrictions Precautions Precautions: Fall Restrictions Weight Bearing Restrictions: No      Mobility  Bed Mobility Overal bed mobility: Modified Independent             General bed mobility comments: HOB elevated, increased time    Transfers Overall transfer level: Needs assistance Equipment used: Rolling walker (2 wheels) Transfers: Sit to/from Stand Sit to Stand: Supervision           General transfer comment: S for safety, assist to manage IV lines    Ambulation/Gait Ambulation/Gait assistance: Supervision Gait Distance (Feet): 20 Feet Assistive device: Rolling walker (2 wheels) Gait Pattern/deviations: Step-through pattern, Trunk flexed Gait velocity: decreased     General Gait Details: slow and steady with RW, easily fatigued  Stairs            Wheelchair Mobility    Modified Rankin (Stroke Patients Only)       Balance Overall balance assessment: Mild deficits observed, not formally tested                                           Pertinent Vitals/Pain Pain Assessment Pain Assessment: No/denies pain    Home Living Family/patient expects to be discharged to:: Private residence Living Arrangements: Children Available Help at Discharge: Family;Available PRN/intermittently Type of Home: Other(Comment) (duplex) Home Access: Stairs to enter   Entrance Stairs-Number of Steps: 1   Home Layout: One level Home Equipment: Grab bars - tub/shower      Prior Function Prior Level of Function : Independent/Modified Independent;History of Falls (last six months)  Mobility Comments: no falls recently       Hand Dominance        Extremity/Trunk Assessment   Upper Extremity Assessment Upper Extremity Assessment: Defer to OT evaluation    Lower Extremity Assessment Lower Extremity Assessment: Generalized weakness    Cervical / Trunk Assessment Cervical / Trunk Assessment:  Kyphotic  Communication   Communication: No difficulties  Cognition Arousal/Alertness: Awake/alert Behavior During Therapy: WFL for tasks assessed/performed Overall Cognitive Status: Within Functional Limits for tasks assessed                                          General Comments      Exercises     Assessment/Plan    PT Assessment Patient needs continued PT services  PT Problem List Decreased strength;Decreased activity tolerance;Decreased balance;Decreased mobility       PT Treatment Interventions DME instruction;Gait training;Stair training;Patient/family education;Functional mobility training;Therapeutic activities;Therapeutic exercise;Balance training    PT Goals (Current goals can be found in the Care Plan section)  Acute Rehab PT Goals Patient Stated Goal: get stronger PT Goal Formulation: With patient/family Time For Goal Achievement: 02/15/23 Potential to Achieve Goals: Good    Frequency Min 3X/week     Co-evaluation               AM-PAC PT "6 Clicks" Mobility  Outcome Measure Help needed turning from your back to your side while in a flat bed without using bedrails?: None Help needed moving from lying on your back to sitting on the side of a flat bed without using bedrails?: None Help needed moving to and from a bed to a chair (including a wheelchair)?: A Little Help needed standing up from a chair using your arms (e.g., wheelchair or bedside chair)?: A Little Help needed to walk in hospital room?: A Little Help needed climbing 3-5 steps with a railing? : A Lot 6 Click Score: 19    End of Session   Activity Tolerance: Patient tolerated treatment well Patient left: in chair;with call bell/phone within reach;with family/visitor present;with nursing/sitter in room Nurse Communication: Mobility status PT Visit Diagnosis: Difficulty in walking, not elsewhere classified (R26.2);Muscle weakness (generalized) (M62.81)    Time:  1002-1030 PT Time Calculation (min) (ACUTE ONLY): 28 min   Charges:   PT Evaluation $PT Eval Low Complexity: 1 Low PT Treatments $Gait Training: 8-22 mins       Deniece Ree PT DPT PN2

## 2023-02-01 NOTE — Progress Notes (Signed)
New Admission Note:   Arrival Method: Stretcher Mental Orientation: alert and oriented x4 Telemetry: Box#7  Assessment: Completed Skin: intact IV: 20 RAC Pain: none Tubes: Safety Measures: Safety Fall Prevention Plan has been given, discussed and signed Admission: Completed [facility] Orientation: Patient has been orientated to the room, unit and staff.  Family: Son and daughter bedside  Orders have been reviewed and implemented. Will continue to monitor the patient. Call light has been placed within reach and bed alarm has been activated.   Oralia Rud LPN [place] [facility] Phone: [phone]

## 2023-02-01 NOTE — Progress Notes (Signed)
PROGRESS NOTE    Janice [name]  [id number] DOB: 04-04-1941 DOA: 01/31/2023 PCP: Teodora Medici, DO   Brief Narrative:  HPI: Janice [name] is a 82 y.o. female with medical history significant of CKD 3B, HLD, HTN, thyroid disease, anemia of CKD who presented to ED with complaints of N/V and dizziness. She has some lower abdominal pain, but no other pain.  She also had some diarrhea, but states this seems to have gotten better.  She denies any sick contacts, raw food, travel.  She has had no fever/chills. She is unable to keep food or water down or her medication. She usually ambulates independently but is so weak she is having a hard time walking. She gets dizzy with postural changes.      Denies any fever/chills, vision changes/headaches, chest pain or palpitations, shortness of breath or cough, abdominal pain, dysuria or leg swelling from baseline.      She does not smoke or drink alcohol.    ER Course:  vitals: afebrile, bp: 144/71, HR: 88, RR: 18, oxygen: 98%RA Pertinent labs: WBC: 26, potassium: 2.9, creatinine: 1.85 (1.2-1.4),  CT abdomen/pelvis: no acute finding.  In ED: given IVF bolus, potassium replacement and zofran. TRH asked to admit.       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  Likely viral gastroenteritis.  Her symptoms have improved.  She has not had any bowel movement since admission.  As needed medications are  controlling well her nausea.  Stool studies are pending.  Hypokalemia: Low again.  Will replace.  Will check magnesium.   Acute renal failure superimposed on stage 3b chronic kidney disease (HCC) Baseline creatinine 1.4-1.6, creatinine 1.85 upon presentation which is improving with IV fluids.  This is likely due to dehydration.  Continue to hold [place] for now and continue IV fluids.   Hypothyroidism Continue home synthroid of 112 mcg daily    Mixed hyperlipidemia Continue crestor at '20mg'$  daily    Peripheral vascular disease (HCC) Continue ASA and statin   DVT prophylaxis: enoxaparin (LOVENOX) injection 30 mg Start: 01/31/23 2200 Place TED hose Start: 01/31/23 1721   Code Status: DNR  Family Communication: Daughter present at bedside.  Plan of care discussed with patient in length and he/she verbalized understanding and agreed with it.  Status is: Observation The patient will require care spanning > 2 midnights and should be moved to inpatient because: Patient is still symptomatic, will require at least another day of hospitalization.   Estimated body mass index is 31.45 kg/m as calculated from the following:   Height as of this encounter: '5\' 2"'$  (1.575 m).   Weight as of this encounter: 78 kg.    Nutritional Assessment: Body mass index is 31.45 kg/m.Marland Kitchen Seen by dietician.  I agree with the assessment and plan as outlined below: Nutrition Status:        . Skin Assessment: I have examined the patient's skin and I agree with the wound assessment as  performed by the wound care RN as outlined below:    Consultants:  None  Procedures:  None  Antimicrobials:  Anti-infectives (From admission, onward)    None         Subjective: Patient seen and examined.  She says that she has intermittent nausea, but better than yesterday.  She has not had any bowel movement since admission.  No abdominal pain.  Objective: Vitals:   01/31/23 2044 02/01/23 0040  02/01/23 0450 02/01/23 0902  BP: 116/64 (!) 137/58 139/77 134/72  Pulse: 88 88 82 83  Resp: '16 19  18  '$ Temp: 98.5 F (36.9 C) 98.8 F (37.1 C) 98.6 F (37 C) 98.2 F (36.8 C)  TempSrc: Oral Oral Oral Oral  SpO2: 97% 97% 99% 98%  Weight:      Height:        Intake/Output Summary (Last 24 hours) at 02/01/2023 0957 Last data filed at 02/01/2023 0900 Gross per 24 hour  Intake 2833.13 ml  Output 0 ml  Net 2833.13 ml   Filed Weights   01/31/23 0942  Weight: 78 kg    Examination:  General exam: Appears calm and comfortable  Respiratory system: Clear to auscultation. Respiratory effort normal. Cardiovascular system: S1 & S2 heard, RRR. No JVD, murmurs, rubs, gallops or clicks. No pedal edema. Gastrointestinal system: Abdomen is nondistended, soft and nontender. No organomegaly or masses felt. Normal bowel sounds heard. Central nervous system: Alert and oriented. No focal neurological deficits. Extremities: Symmetric 5 x 5 power. Skin: No rashes, lesions or ulcers Psychiatry: Judgement and insight appear normal. Mood & affect appropriate.    Data Reviewed: I have personally reviewed following labs and imaging studies  CBC: Recent Labs  Lab 01/31/23 1010 02/01/23 0821  WBC 26.0* 16.8*  NEUTROABS 23.4*  --   HGB 12.2 11.0*  HCT 36.3 32.6*  MCV 90.3 91.3  PLT 240 [id number]   Basic Metabolic Panel: Recent Labs  Lab 01/31/23 1010 02/01/23 0821  NA 138 141  K 2.9* 3.3*  CL 101 106  CO2 21* 24  GLUCOSE 175* 113*  BUN 21 17  CREATININE 1.85* 1.68*  CALCIUM 9.1 8.5*  MG 1.6*  --    GFR: Estimated Creatinine Clearance: 24.6 mL/min (A) (by C-G formula based on SCr of 1.68 mg/dL (H)). Liver Function Tests: Recent Labs  Lab 01/31/23 1010  AST 26  ALT 14  ALKPHOS 77  BILITOT 0.9  PROT 6.5  ALBUMIN 2.6*   Recent Labs  Lab 01/31/23 1010  LIPASE 25   No results for input(s): "AMMONIA" in the last 168 hours. Coagulation Profile: No results for input(s): "INR",  "PROTIME" in the last 168 hours. Cardiac Enzymes: No results for input(s): "CKTOTAL", "CKMB", "CKMBINDEX", "TROPONINI" in the last 168 hours. BNP (last 3 results) No results for input(s): "PROBNP" in the last 8760 hours. HbA1C: No results for input(s): "HGBA1C" in the last 72 hours. CBG: No results for input(s): "GLUCAP" in the last 168 hours. Lipid Profile: No results for input(s): "CHOL", "HDL", "LDLCALC", "TRIG", "CHOLHDL", "LDLDIRECT" in the last 72 hours. Thyroid Function Tests: No results for input(s): "TSH", "T4TOTAL", "FREET4", "T3FREE", "THYROIDAB" in the last 72 hours. Anemia Panel: No results for input(s): "VITAMINB12", "FOLATE", "FERRITIN", "TIBC", "IRON", "RETICCTPCT" in the last 72 hours. Sepsis Labs: No results for input(s): "PROCALCITON", "LATICACIDVEN" in the last 168 hours.  Recent Results (from the past 240 hour(s))  Resp panel by RT-PCR (RSV, Flu A&B, Covid) Anterior Nasal Swab     Status: None  Collection Time: 01/31/23 10:10 AM   Specimen: Anterior Nasal Swab  Result Value Ref Range Status   SARS Coronavirus 2 by RT PCR NEGATIVE NEGATIVE Final   Influenza A by PCR NEGATIVE NEGATIVE Final   Influenza B by PCR NEGATIVE NEGATIVE Final    Comment: (NOTE) The Xpert Xpress SARS-CoV-2/FLU/RSV plus assay is intended as an aid in the diagnosis of influenza from Nasopharyngeal swab specimens and should not be used as a sole basis for treatment. Nasal washings and aspirates are unacceptable for Xpert Xpress SARS-CoV-2/FLU/RSV testing.  Fact Sheet for Patients: EntrepreneurPulse.com.au  Fact Sheet for Healthcare Providers: IncredibleEmployment.be  This test is not yet approved or cleared by the [place] FDA and has been authorized for detection and/or diagnosis of SARS-CoV-2 by FDA under an Emergency Use Authorization (EUA). This EUA will remain in effect (meaning this test can be used) for the duration of the COVID-19  declaration under Section 564(b)(1) of the Act, 21 U.S.C. section 360bbb-3(b)(1), unless the authorization is terminated or revoked.     Resp Syncytial Virus by PCR NEGATIVE NEGATIVE Final    Comment: (NOTE) Fact Sheet for Patients: EntrepreneurPulse.com.au  Fact Sheet for Healthcare Providers: IncredibleEmployment.be  This test is not yet approved or cleared by the [place] FDA and has been authorized for detection and/or diagnosis of SARS-CoV-2 by FDA under an Emergency Use Authorization (EUA). This EUA will remain in effect (meaning this test can be used) for the duration of the COVID-19 declaration under Section 564(b)(1) of the Act, 21 U.S.C. section 360bbb-3(b)(1), unless the authorization is terminated or revoked.  Performed at [facility], [place] [address], [place], [place] [address]      Radiology Studies: CT ABDOMEN PELVIS WO CONTRAST  Result Date: 01/31/2023 CLINICAL DATA:  Abdominal pain EXAM: CT ABDOMEN AND PELVIS WITHOUT CONTRAST TECHNIQUE: Multidetector CT imaging of the abdomen and pelvis was performed following the standard protocol without IV contrast. RADIATION DOSE REDUCTION: This exam was performed according to the departmental dose-optimization program which includes automated exposure control, adjustment of the mA and/or kV according to patient size and/or use of iterative reconstruction technique. COMPARISON:  Renal sonogram done on 07/08/2020 FINDINGS: Lower chest: Small bilateral pleural effusions are seen, more so on the left side. Increased markings are seen in the lower lung fields, more so on the left side suggesting atelectasis. Dense calcification is seen in mitral annulus. Hepatobiliary: Surgical clips are seen in gallbladder fossa. No focal abnormalities are seen in liver. There is no dilation of bile ducts. Pancreas: There is atrophy.  No focal abnormalities are seen. Spleen: Unremarkable. Adrenals/Urinary  Tract: Adrenals are unremarkable. There is no hydronephrosis. There are few smooth marginated fluid density lesions in both kidneys largest in the lower pole of right kidney measuring 3.9 cm. There are no calcific densities in the kidneys and courses of the ureters. Urinary bladder is unremarkable. Stomach/Bowel: Stomach is unremarkable. There is no significant small bowel dilation. Appendix is not distinctly seen. There is no focal pericecal inflammation. Scattered diverticula are seen in colon without signs of focal acute diverticulitis. Vascular/Lymphatic: Extensive arterial calcifications are seen. Aortobifemoral graft is seen. Evaluation of lumen is limited in this noncontrast study. Reproductive: No acute findings are seen. Other: There is no ascites or pneumoperitoneum. Musculoskeletal: No acute findings are seen. IMPRESSION: There is no evidence of intestinal obstruction or pneumoperitoneum. There is no hydronephrosis. Severe atherosclerosis. Diverticulosis of colon without signs of focal diverticulitis. Bilateral renal cysts. Small bilateral pleural effusions. Subsegmental atelectasis  is seen in lower lung fields, more so on the left side. Other findings as described in the body of the report. Electronically Signed   By: Elmer Picker M.D.   On: 01/31/2023 14:22    Scheduled Meds:  enoxaparin (LOVENOX) injection  30 mg Subcutaneous Q24H   levothyroxine  112 mcg Oral Q0600   potassium chloride  40 mEq Oral Once   Continuous Infusions:  sodium chloride 75 mL/hr at 02/01/23 0509   promethazine (PHENERGAN) injection (IM or IVPB) 12.5 mg (01/31/23 1850)     LOS: 0 days   Darliss Cheney, MD [facility]  02/01/2023, 9:57 AM   *Please note that this is a verbal dictation therefore any spelling or grammatical errors are due to the "[employer]" system interpretation.  Please page via [place] and do not message via secure chat for urgent patient care matters. Secure chat can be used  for non urgent patient care matters.  How to contact the [facility] Attending or Consulting provider [place] or covering provider during after hours [place], for this patient?  Check the care team in [facility] and look for a) attending/consulting TRH provider listed and b) the [facility] team listed. Page or secure chat [phone]. Log into [email or web address] and use [place]'s universal password to access. If you do not have the password, please contact the hospital operator. Locate the [facility] provider you are looking for under [facility] and page to a number that you can be directly reached. If you still have difficulty reaching the provider, please page the [facility] (Director on Call) for the Hospitalists listed on amion for assistance.

## 2023-02-01 NOTE — Evaluation (Signed)
Occupational Therapy Evaluation Patient Details Name: GENNY BALL MRN: [id number] DOB: [birth year] Today's Date: 02/01/2023   History of Present Illness 82yo F who presented to the ED with c/o nausea, vomiting, dizziness, diarrhea. Admitted with intractable nausea and vomiting, ARF. PMH CKD, hernia s/p repair, HLD, HTN, subclavicular artery stenosis, artery bypass rechanneling   Clinical Impression   Pt typically walks without AD and has distant supervision for showering due to high wall of tub. She lives with her daughter who does the housekeeping, but pt is responsible for meal prep. Pt presents with generalized weakness and needs set up to supervision for ADLs and mobility with use of RW. Pt likely to progress well as medical issues resolve. Recommending HHOT to address tub equipment as pt is currently using a step stool to step over edge of tub and IADLs.      Recommendations for follow up therapy are one component of a multi-disciplinary discharge planning process, led by the attending physician.  Recommendations may be updated based on patient status, additional functional criteria and insurance authorization.   Follow Up Recommendations  Home Health OT     Assistance Recommended at Discharge Intermittent Supervision/Assistance  Patient can return home with the following A little help with walking and/or transfers;Assistance with cooking/housework;Assist for transportation;Help with stairs or ramp for entrance    Functional Status Assessment  Patient has had a recent decline in their functional status and demonstrates the ability to make significant improvements in function in a reasonable and predictable amount of time.  Equipment Recommendations  None recommended by OT    Recommendations for Other Services       Precautions / Restrictions Precautions Precautions: Fall Restrictions Weight Bearing Restrictions: No      Mobility Bed Mobility               General  bed mobility comments: pt in chair    Transfers Overall transfer level: Needs assistance Equipment used: Rolling walker (2 wheels) Transfers: Sit to/from Stand Sit to Stand: Supervision           General transfer comment: S for safety, assist to manage IV lines      Balance Overall balance assessment: Mild deficits observed, not formally tested                                         ADL either performed or assessed with clinical judgement   ADL Overall ADL's : Needs assistance/impaired Eating/Feeding: Independent;Sitting   Grooming: Wash/dry hands;Standing;Supervision/safety   Upper Body Bathing: Set up;Sitting   Lower Body Bathing: Supervison/ safety;Sit to/from stand   Upper Body Dressing : Set up;Sitting   Lower Body Dressing: Supervision/safety;Sit to/from stand   Toilet Transfer: Supervision/safety;Ambulation;Rolling walker (2 wheels)   Toileting- Clothing Manipulation and Hygiene: Supervision/safety;Sit to/from stand       Functional mobility during ADLs: Supervision/safety;Rolling walker (2 wheels)       Vision Ability to See in Adequate Light: 0 Adequate Patient Visual Report: No change from baseline       Perception     Praxis      Pertinent Vitals/Pain Pain Assessment Pain Assessment: No/denies pain     Hand Dominance Right   Extremity/Trunk Assessment Upper Extremity Assessment Upper Extremity Assessment: Overall WFL for tasks assessed   Lower Extremity Assessment Lower Extremity Assessment: Defer to PT evaluation   Cervical / Trunk Assessment Cervical / Trunk  Assessment: Kyphotic   Communication Communication Communication: HOH   Cognition Arousal/Alertness: Awake/alert Behavior During Therapy: WFL for tasks assessed/performed Overall Cognitive Status: Within Functional Limits for tasks assessed                                       General Comments       Exercises     Shoulder  Instructions      Home Living Family/patient expects to be discharged to:: Private residence Living Arrangements: Children Available Help at Discharge: Family;Available PRN/intermittently (daughter works) Type of Home: Other(Comment) (townhome) Home Access: Stairs to enter Technical brewer of Steps: 1   Home Layout: One level     Bathroom Shower/Tub: Tub/shower unit (step stool to get over edge)   Bathroom Toilet: Handicapped height     Home Equipment: Grab bars - tub/shower          Prior Functioning/Environment Prior Level of Function : Independent/Modified Independent;History of Falls (last six months)             Mobility Comments: no falls recently ADLs Comments: daughter does housekeeping, pt does meal prep        OT Problem List: Decreased strength;Impaired balance (sitting and/or standing)      OT Treatment/Interventions: Self-care/ADL training;Therapeutic activities;Balance training;Patient/family education    OT Goals(Current goals can be found in the care plan section) Acute Rehab OT Goals OT Goal Formulation: With patient/family Time For Goal Achievement: 02/15/23 Potential to Achieve Goals: Good ADL Goals Pt Will Perform Grooming: with modified independence;standing (3 activities) Pt Will Transfer to Toilet: with modified independence;ambulating;regular height toilet Additional ADL Goal #1: Pt will complete bathing and dressing modified independently.  OT Frequency: Min 2X/week    Co-evaluation              AM-PAC OT "6 Clicks" Daily Activity     Outcome Measure Help from another person eating meals?: None Help from another person taking care of personal grooming?: A Little Help from another person toileting, which includes using toliet, bedpan, or urinal?: A Little Help from another person bathing (including washing, rinsing, drying)?: A Little Help from another person to put on and taking off regular upper body clothing?:  None Help from another person to put on and taking off regular lower body clothing?: A Little 6 Click Score: 20   End of Session Equipment Utilized During Treatment: Rolling walker (2 wheels);Gait belt  Activity Tolerance: Patient tolerated treatment well Patient left: in chair;with call bell/phone within reach;with chair alarm set  OT Visit Diagnosis: Other abnormalities of gait and mobility (R26.89);Unsteadiness on feet (R26.81);Muscle weakness (generalized) (M62.81)                Time: [id number] OT Time Calculation (min): 16 min Charges:  OT General Charges $OT Visit: 1 Visit OT Evaluation $OT Eval Low Complexity: [place], OTR/L Acute Rehabilitation Services Office: [phone]   Malka So 02/01/2023, 1:05 PM

## 2023-02-01 NOTE — TOC Initial Note (Signed)
Transition of Care [facility]) - Initial/Assessment Note    Patient Details  Name: Janice [name] MRN: [id number] Date of Birth: 07-01-1941  [facility]) CM/SW Contact:    Pollie Friar, RN Phone Number: 02/01/2023, 3:44 PM  Clinical Narrative:                 Pt is from home with her daughter. Daughter works during the daytime most of the time but sometimes works nights as well. They do have people they can ask to check on the patient if needed.  No DME at home.  Pt manages her own medications and denies any issues.  Daughter provides needed transportation.  Therapies recommended [place] services. Pt has no preference. Home health arranged with [facility]. Information on the AVS.  Daughter will transport home when medically ready.  Expected Discharge Plan: [place] Barriers to Discharge: Continued Medical Work up   Patient Goals and CMS Choice   CMS Medicare.gov Compare Post Acute Care list provided to:: Patient Choice offered to / list presented to : Patient, Adult Children      Expected Discharge Plan and Services   Discharge Planning Services: CM Consult Post Acute Care Choice: [place] arrangements for the past 2 months: Single Family Home                           HH Arranged: PT, OT HH Agency: [place] Date [place]: 02/01/23   Representative spoke with at [place]: Tommi Rumps  Prior Living Arrangements/Services Living arrangements for the past 2 months: [place] Lives with:: Adult Children Patient language and need for interpreter reviewed:: Yes Do you feel safe going back to the place where you live?: Yes        Care giver support system in place?: No (comment)   Criminal Activity/Legal Involvement Pertinent to Current Situation/Hospitalization: No - Comment as needed  Activities of Daily Living Home Assistive Devices/Equipment: Other (Comment) (Shower handles) ADL Screening (condition at time  of admission) Patient's cognitive ability adequate to safely complete daily activities?: Yes Is the patient deaf or have difficulty hearing?: Yes Does the patient have difficulty seeing, even when wearing glasses/contacts?: No Does the patient have difficulty concentrating, remembering, or making decisions?: No Patient able to express need for assistance with ADLs?: No Does the patient have difficulty dressing or bathing?: No Independently performs ADLs?: Yes (appropriate for developmental age) Does the patient have difficulty walking or climbing stairs?: No Weakness of Legs: Both Weakness of Arms/Hands: Left  Permission Sought/Granted                  Emotional Assessment Appearance:: Appears stated age Attitude/Demeanor/Rapport: Engaged Affect (typically observed): Accepting Orientation: : Oriented to Self, Oriented to Place, Oriented to  Time, Oriented to Situation   Psych Involvement: No (comment)  Admission diagnosis:  Hypokalemia [E87.6] AKI (acute kidney injury) ([place]) [N17.9] Intractable nausea and vomiting [R11.2] Nausea and vomiting, unspecified vomiting type [R11.2] Gastroenteritis [K52.9] Patient Active Problem List   Diagnosis Date Noted   Gastroenteritis 02/01/2023   Intractable nausea and vomiting 01/31/2023   Hypokalemia 01/31/2023   Leukocytosis 01/31/2023   Prediabetes 03/03/2021   Hypertensive renal disease with renal failure, 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 unspecified chronic kidney disease 05/20/2018   Anemia of chronic renal failure 05/20/2018   Hyperparathyroidism, secondary renal ([place]) 05/20/2018   Anemia 04/07/2018   Microalbuminuria 10/09/2017   Acute renal  failure superimposed on stage 3b chronic kidney disease ([place]) 10/09/2017   DNAR (do not attempt resuscitation) 10/08/2017   Mixed hyperlipidemia 02/01/2016   Peripheral vascular disease ([place]) 02/01/2016   LBBB (left bundle branch block) 01/23/2016   Hypothyroidism 11/21/2015   Medication monitoring  encounter 11/21/2015   Atherosclerosis of arteries 05/23/2015   IFG (impaired fasting glucose) 05/23/2015   Overweight (BMI 25.0-29.9) 05/23/2015   PCP:  Teodora Medici, DO Pharmacy:   [id number] [place], [place] - [address] [place] [place]: [phone]: [phone] [id number] [place]: [phone]: [phone]   Social Determinants of Health (SDOH) Social History: [place] Insecurity (01/31/2023)  Housing: Low Risk  (01/31/2023)  Transportation Needs: No Transportation Needs (01/31/2023)  Utilities: Not At Risk (01/31/2023)  Alcohol Screen: Low Risk  (11/13/2022)  Depression (PHQ2-9): Low Risk  (11/13/2022)  Financial Resource Strain: Low Risk  (11/13/2022)  Physical Activity: Sufficiently Active (11/13/2022)  Social Connections: Moderately Isolated (11/13/2022)  Stress: No Stress Concern Present (11/13/2022)  Tobacco Use: Medium Risk (01/31/2023)   SDOH Interventions:     Readmission Risk Interventions     No data to display

## 2023-02-01 NOTE — Progress Notes (Signed)
   02/01/23 1200  Spiritual Encounters  Type of Visit Initial  Care provided to: Patient;Family  Referral source Patient request  Reason for visit Advance directives   Ch responded to request for AD. Pt's daughter Butch Penny was at bedside. Ch provided AD education. Pt will notify Ch when they are ready to complete the form. No follow-up needed at this time.

## 2023-02-02 DIAGNOSIS — R112 Nausea with vomiting, unspecified: Secondary | ICD-10-CM

## 2023-02-02 LAB — GASTROINTESTINAL PANEL BY PCR, STOOL (REPLACES STOOL CULTURE)

## 2023-02-02 LAB — COMPREHENSIVE METABOLIC PANEL
ALT: 12 U/L (ref 0–44)
AST: 18 U/L (ref 15–41)
Albumin: 1.8 g/dL — ABNORMAL LOW (ref 3.5–5.0)
Alkaline Phosphatase: 46 U/L (ref 38–126)
Anion gap: 7 (ref 5–15)
BUN: 17 mg/dL (ref 8–23)
CO2: 22 mmol/L (ref 22–32)
Calcium: 7.9 mg/dL — ABNORMAL LOW (ref 8.9–10.3)
Chloride: 108 mmol/L (ref 98–111)
Creatinine, Ser: 1.66 mg/dL — ABNORMAL HIGH (ref 0.44–1.00)
GFR, Estimated: 30 mL/min — ABNORMAL LOW (ref >60)
Glucose, Bld: 101 mg/dL — ABNORMAL HIGH (ref 70–99)
Potassium: 3.3 mmol/L — ABNORMAL LOW (ref 3.5–5.1)
Sodium: 137 mmol/L (ref 135–145)
Total Bilirubin: 0.4 mg/dL (ref 0.3–1.2)
Total Protein: 4.7 g/dL — ABNORMAL LOW (ref 6.5–8.1)

## 2023-02-02 LAB — CBC WITH DIFFERENTIAL/PLATELET
Abs Immature Granulocytes: 0.08 10*3/uL — ABNORMAL HIGH (ref 0.00–0.07)
Basophils Absolute: 0.1 10*3/uL (ref 0.0–0.1)
Basophils Relative: 1 %
Eosinophils Absolute: 0 10*3/uL (ref 0.0–0.5)
Eosinophils Relative: 0 %
HCT: 27.8 % — ABNORMAL LOW (ref 36.0–46.0)
Hemoglobin: 9.2 g/dL — ABNORMAL LOW (ref 12.0–15.0)
Immature Granulocytes: 1 %
Lymphocytes Relative: 16 %
Lymphs Abs: 1.6 10*3/uL (ref 0.7–4.0)
MCH: 30.2 pg (ref 26.0–34.0)
MCHC: 33.1 g/dL (ref 30.0–36.0)
MCV: 91.1 fL (ref 80.0–100.0)
Monocytes Absolute: 0.7 10*3/uL (ref 0.1–1.0)
Monocytes Relative: 6 %
Neutro Abs: 8 10*3/uL — ABNORMAL HIGH (ref 1.7–7.7)
Neutrophils Relative %: 76 %
Platelets: 186 10*3/uL (ref 150–400)
RBC: 3.05 MIL/uL — ABNORMAL LOW (ref 3.87–5.11)
RDW: 14 % (ref 11.5–15.5)
WBC: 10.5 10*3/uL (ref 4.0–10.5)
nRBC: 0 % (ref 0.0–0.2)

## 2023-02-02 LAB — MAGNESIUM: Magnesium: 1.7 mg/dL (ref 1.7–2.4)

## 2023-02-02 MED ORDER — POTASSIUM CHLORIDE CRYS ER 20 MEQ PO TBCR
40.0000 meq | EXTENDED_RELEASE_TABLET | ORAL | Status: AC
  Administered 2023-02-02 (×2): 40 meq via ORAL
  Filled 2023-02-02 (×2): qty 2

## 2023-02-02 NOTE — Plan of Care (Signed)
  Problem: Education: Goal: Knowledge of General Education information will improve Description: Including pain rating scale, medication(s)/side effects and non-pharmacologic comfort measures Outcome: Completed/Met   

## 2023-02-02 NOTE — Progress Notes (Signed)
Physical Therapy Treatment Patient Details Name: Janice [name] MRN: [id number] DOB: Apr 18, 1941 Today's Date: 02/02/2023   History of Present Illness 82yo F who presented to the ED with c/o nausea, vomiting, dizziness, diarrhea. Admitted with intractable nausea and vomiting, ARF. PMH CKD, hernia s/p repair, HLD, HTN, subclavicular artery stenosis, artery bypass rechanneling    PT Comments    Pt tolerated today's session well, able to progress ambulation to 69' with supervision and RW. Pt provided a seated rest break and then was able to perform 5 sit<>stand transfers for strength and endurance, as pt reports not feeling back to her baseline. Pt with mild imbalance noted when turning 180 degrees with ambulation, provided education for taking her time when turning and not trying to perform with one quick motion. Pt will continue to benefit from skilled acute PT to progress mobility and endurance back to her baseline, discharge plans remain appropriate.     Recommendations for follow up therapy are one component of a multi-disciplinary discharge planning process, led by the attending physician.  Recommendations may be updated based on patient status, additional functional criteria and insurance authorization.  Follow Up Recommendations  Home health PT     Assistance Recommended at Discharge Intermittent Supervision/Assistance  Patient can return home with the following A little help with walking and/or transfers;Assistance with cooking/housework;Assist for transportation;A little help with bathing/dressing/bathroom;Help with stairs or ramp for entrance   Equipment Recommendations  Rolling walker (2 wheels);BSC/3in1    Recommendations for Other Services       Precautions / Restrictions Precautions Precautions: Fall Restrictions Weight Bearing Restrictions: No     Mobility  Bed Mobility Overal bed mobility: Modified Independent             General bed mobility comments: HOB  elevated and use of bed rails but no assist required    Transfers Overall transfer level: Needs assistance Equipment used: Rolling walker (2 wheels) Transfers: Sit to/from Stand Sit to Stand: Supervision           General transfer comment: supervision for safety, cued for hand placement prior to standing/sitting, good carryover noted with repetition    Ambulation/Gait Ambulation/Gait assistance: Supervision Gait Distance (Feet): 75 Feet Assistive device: Rolling walker (2 wheels) Gait Pattern/deviations: Step-through pattern, Trunk flexed, Decreased stride length Gait velocity: decreased     General Gait Details: slow gait, mild imabalnce when turning 180 degrees but pt correcting with cues for taking her time, pt fatiguing easily   Stairs             Wheelchair Mobility    Modified Rankin (Stroke Patients Only)       Balance Overall balance assessment: Mild deficits observed, not formally tested                                          Cognition Arousal/Alertness: Awake/alert Behavior During Therapy: WFL for tasks assessed/performed Overall Cognitive Status: Within Functional Limits for tasks assessed                                 General Comments: pleasant throughout        Exercises Other Exercises Other Exercises: sit<>stand x5 reps for BLE strengthening and endurance    General Comments General comments (skin integrity, edema, etc.): mild fatigue with mobility but no complaints of SOB  Pertinent Vitals/Pain Pain Assessment Pain Assessment: No/denies pain    Home Living                          Prior Function            PT Goals (current goals can now be found in the care plan section) Acute Rehab PT Goals Patient Stated Goal: get stronger PT Goal Formulation: With patient/family Time For Goal Achievement: 02/15/23 Potential to Achieve Goals: Good Progress towards PT goals: Progressing  toward goals    Frequency    Min 3X/week      PT Plan Current plan remains appropriate    Co-evaluation              AM-PAC PT "6 Clicks" Mobility   Outcome Measure  Help needed turning from your back to your side while in a flat bed without using bedrails?: None Help needed moving from lying on your back to sitting on the side of a flat bed without using bedrails?: None Help needed moving to and from a bed to a chair (including a wheelchair)?: A Little Help needed standing up from a chair using your arms (e.g., wheelchair or bedside chair)?: A Little Help needed to walk in hospital room?: A Little Help needed climbing 3-5 steps with a railing? : A Lot 6 Click Score: 19    End of Session   Activity Tolerance: Patient tolerated treatment well Patient left: with call bell/phone within reach;with nursing/sitter in room;in bed Nurse Communication: Mobility status PT Visit Diagnosis: Difficulty in walking, not elsewhere classified (R26.2);Muscle weakness (generalized) (M62.81)     Time: [id number] PT Time Calculation (min) (ACUTE ONLY): 11 min  Charges:  $Therapeutic Activity: 8-22 mins                     Charlynne Cousins, PT DPT Acute Rehabilitation Services Office [phone]    Luvenia Heller 02/02/2023, 12:48 PM

## 2023-02-02 NOTE — Discharge Summary (Signed)
Physician Discharge Summary  Janice [name] [id number] DOB: 08-16-1941 DOA: 01/31/2023  PCP: Teodora Medici, DO  Admit date: 01/31/2023 Discharge date: 02/02/2023 30 Day Unplanned Readmission Risk Score    Flowsheet Row ED to Hosp-Admission (Current) from 01/31/2023 in [facility] 61M KIDNEY UNIT  30 Day Unplanned Readmission Risk Score (%) 14.43 Filed at 02/02/2023 0800       This score is the patient's risk of an unplanned readmission within 30 days of being discharged (0 -100%). The score is based on dignosis, age, lab data, medications, orders, and past utilization.   Low:  0-14.9   Medium: 15-21.9   High: 22-29.9   Extreme: 30 and above          Admitted From: Home Disposition: Home  Recommendations for Outpatient Follow-up:  Follow up with PCP in 1-2 weeks Please obtain BMP/CBC in one week Please follow up with your PCP on the following pending results: Unresulted Labs (From admission, onward)     Start     Ordered   01/31/23 1501  Gastrointestinal Panel by PCR , Stool  (Gastrointestinal Panel by PCR, Stool                                                                                                                                                     **Does Not include CLOSTRIDIUM DIFFICILE testing. **If CDIFF testing is needed, place order from the "C Difficile Testing" order set.**)  Once,   R        01/31/23 1500   01/31/23 1501  Norovirus group 1 & 2 by PCR, stool  (Norovirus group 1 & 2 by PCR, stool panel)  Once,   R        01/31/23 1500              Home Health: None Equipment/Devices: Bedside commode and 2 wheeled rolling walker  Discharge Condition: Stable CODE STATUS: DNR Diet recommendation: Cardiac  Subjective: Seen and examined.  She has no complaints.  No diarrhea, nausea, vomiting or abdominal pain.  She feels comfortable going home.  I also discussed with patient's daughter over the phone who is also in agreement with the discharge  plan today.  Brief/Interim Summary: SHERMEKA [name] is a 82 y.o. female with medical history significant of CKD 3B, HLD, HTN, thyroid disease, anemia of CKD who presented to ED with complaints of N/V and dizziness and mild intermittent lower abdominal pain.  No fever, chills, chest pain, shortness of breath or any other complaint.  ER Course:  vitals: afebrile, bp: 144/71, HR: 88, RR: 18, oxygen: 98%RA Pertinent labs: WBC: 26, potassium: 2.9, creatinine: 1.85 (1.2-1.4),  CT abdomen/pelvis: no acute finding.  In ED: given IVF bolus, potassium replacement and zofran.  Mated under hospital service.  Details below.    Intractable nausea and vomiting/diarrhea/dizziness:  Likely due to viral gastroenteritis.  C. difficile tested negative.  GI pathogen panel and norovirus testing is in process but patient has improved, no nausea, vomiting or any diarrhea or abdominal pain.  She feels comfortable going home and she is tolerating regular diet.  She was seen by PT OT recommends home health along with DME.  Hypokalemia: Better than yesterday but is still low, will replace before discharge.     Acute renal failure superimposed on stage 3b chronic kidney disease (HCC) Baseline creatinine 1.4-1.6, creatinine 1.85 upon presentation which improved with IV fluids and now back to baseline around 1.6 today.  Her blood pressure remained stable despite of holding Cozaar so Cozaar is being discontinued at the time of discharge.   Essential hypertension: Patient's blood pressure remained stable and controlled despite of holding Cozaar so Cozaar has been discontinued.   Hypothyroidism Continue home synthroid of 112 mcg daily    Mixed hyperlipidemia Continue crestor at '20mg'$  daily    Peripheral vascular disease (HCC) Continue ASA and statin   Discharge plan was discussed with patient and/or family member and they verbalized understanding and agreed with it.  Discharge Diagnoses:  Principal Problem:   Intractable  nausea and vomiting Active Problems:   Acute renal failure superimposed on stage 3b chronic kidney disease (HCC)   Hypokalemia   Leukocytosis   Hypothyroidism   Hypertensive renal disease with renal failure, 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 unspecified chronic kidney disease   Mixed hyperlipidemia   Peripheral vascular disease ([place])   Gastroenteritis    Discharge Instructions   Allergies as of 02/02/2023   No Known Allergies      Medication List     STOP taking these medications    losartan 100 MG tablet Commonly known as: COZAAR       TAKE these medications    amLODipine 5 MG tablet Commonly known as: NORVASC Take 1 tablet (5 mg total) by mouth daily.   aspirin EC 81 MG tablet Take 81 mg daily by mouth.   Farxiga 10 MG Tabs tablet Generic drug: dapagliflozin propanediol Take 10 mg by mouth daily.   levothyroxine 112 MCG tablet Commonly known as: SYNTHROID Take 1 tablet (112 mcg total) by mouth daily.   metoprolol tartrate 25 MG tablet Commonly known as: LOPRESSOR Take 1 tablet (25 mg total) by mouth 2 (two) times daily.   rosuvastatin 20 MG tablet Commonly known as: CRESTOR TAKE 1 TABLET BY MOUTH EVERYDAY AT BEDTIME What changed:  how much to take how to take this when to take this   triamcinolone ointment 0.1 % Commonly known as: KENALOG USE GLOVES TO APPLY TO ROUGH SKIN ON FEET ONCE DAILY. What changed: See the new instructions.        Follow-up Information     [facility], [facility] Follow up.   Specialty: [place] Why: The home health agency will contact you for the first home visit Contact information: [address] [place] [address] [phone]         Teodora Medici, DO Follow up in 1 week(s).   Specialty: Internal Medicine Contact information: [address] [place] [address] [phone]                No Known Allergies  Consultations: None   Procedures/Studies: CT  ABDOMEN PELVIS WO CONTRAST  Result Date: 01/31/2023 CLINICAL DATA:  Abdominal pain EXAM: CT ABDOMEN AND PELVIS WITHOUT CONTRAST TECHNIQUE: Multidetector CT imaging of the abdomen and pelvis  was performed following the standard protocol without IV contrast. RADIATION DOSE REDUCTION: This exam was performed according to the departmental dose-optimization program which includes automated exposure control, adjustment of the mA and/or kV according to patient size and/or use of iterative reconstruction technique. COMPARISON:  Renal sonogram done on 07/08/2020 FINDINGS: Lower chest: Small bilateral pleural effusions are seen, more so on the left side. Increased markings are seen in the lower lung fields, more so on the left side suggesting atelectasis. Dense calcification is seen in mitral annulus. Hepatobiliary: Surgical clips are seen in gallbladder fossa. No focal abnormalities are seen in liver. There is no dilation of bile ducts. Pancreas: There is atrophy.  No focal abnormalities are seen. Spleen: Unremarkable. Adrenals/Urinary Tract: Adrenals are unremarkable. There is no hydronephrosis. There are few smooth marginated fluid density lesions in both kidneys largest in the lower pole of right kidney measuring 3.9 cm. There are no calcific densities in the kidneys and courses of the ureters. Urinary bladder is unremarkable. Stomach/Bowel: Stomach is unremarkable. There is no significant small bowel dilation. Appendix is not distinctly seen. There is no focal pericecal inflammation. Scattered diverticula are seen in colon without signs of focal acute diverticulitis. Vascular/Lymphatic: Extensive arterial calcifications are seen. Aortobifemoral graft is seen. Evaluation of lumen is limited in this noncontrast study. Reproductive: No acute findings are seen. Other: There is no ascites or pneumoperitoneum. Musculoskeletal: No acute findings are seen. IMPRESSION: There is no evidence of intestinal obstruction or  pneumoperitoneum. There is no hydronephrosis. Severe atherosclerosis. Diverticulosis of colon without signs of focal diverticulitis. Bilateral renal cysts. Small bilateral pleural effusions. Subsegmental atelectasis is seen in lower lung fields, more so on the left side. Other findings as described in the body of the report. Electronically Signed   By: Elmer Picker M.D.   On: 01/31/2023 14:22     Discharge Exam: Vitals:   02/02/23 0638 02/02/23 0933  BP: 127/61 (!) 151/64  Pulse: 73 78  Resp: 16 17  Temp: 98.4 F (36.9 C) 97.8 F (36.6 C)  SpO2: 98% 98%   Vitals:   02/01/23 1736 02/01/23 2140 02/02/23 0638 02/02/23 0933  BP: (!) 117/57 (!) 107/54 127/61 (!) 151/64  Pulse: 83 95 73 78  Resp: '18 17 16 17  '$ Temp: 98.4 F (36.9 C) 98.9 F (37.2 C) 98.4 F (36.9 C) 97.8 F (36.6 C)  TempSrc: Oral Oral Oral Oral  SpO2: 99% 95% 98% 98%  Weight:      Height:        General: Pt is alert, awake, not in acute distress Cardiovascular: RRR, S1/S2 +, no rubs, no gallops Respiratory: CTA bilaterally, no wheezing, no rhonchi Abdominal: Soft, NT, ND, bowel sounds + Extremities: no edema, no cyanosis    The results of significant diagnostics from this hospitalization (including imaging, microbiology, ancillary and laboratory) are listed below for reference.     Microbiology: Recent Results (from the past 240 hour(s))  Resp panel by RT-PCR (RSV, Flu A&B, Covid) Anterior Nasal Swab     Status: None   Collection Time: 01/31/23 10:10 AM   Specimen: Anterior Nasal Swab  Result Value Ref Range Status   SARS Coronavirus 2 by RT PCR NEGATIVE NEGATIVE Final   Influenza A by PCR NEGATIVE NEGATIVE Final   Influenza B by PCR NEGATIVE NEGATIVE Final    Comment: (NOTE) The Xpert Xpress SARS-CoV-2/FLU/RSV plus assay is intended as an aid in the diagnosis of influenza from Nasopharyngeal swab specimens and should not be used as a  sole basis for treatment. Nasal washings and aspirates are  unacceptable for Xpert Xpress SARS-CoV-2/FLU/RSV testing.  Fact Sheet for Patients: EntrepreneurPulse.com.au  Fact Sheet for Healthcare Providers: IncredibleEmployment.be  This test is not yet approved or cleared by the [place] FDA and has been authorized for detection and/or diagnosis of SARS-CoV-2 by FDA under an Emergency Use Authorization (EUA). This EUA will remain in effect (meaning this test can be used) for the duration of the COVID-19 declaration under Section 564(b)(1) of the Act, 21 U.S.C. section 360bbb-3(b)(1), unless the authorization is terminated or revoked.     Resp Syncytial Virus by PCR NEGATIVE NEGATIVE Final    Comment: (NOTE) Fact Sheet for Patients: EntrepreneurPulse.com.au  Fact Sheet for Healthcare Providers: IncredibleEmployment.be  This test is not yet approved or cleared by the [place] FDA and has been authorized for detection and/or diagnosis of SARS-CoV-2 by FDA under an Emergency Use Authorization (EUA). This EUA will remain in effect (meaning this test can be used) for the duration of the COVID-19 declaration under Section 564(b)(1) of the Act, 21 U.S.C. section 360bbb-3(b)(1), unless the authorization is terminated or revoked.  Performed at [facility], [place] [address] [place], [place] [address]   C Difficile Quick Screen w PCR reflex     Status: None   Collection Time: 02/01/23  8:00 AM   Specimen: STOOL  Result Value Ref Range Status   C Diff antigen NEGATIVE NEGATIVE Final   C Diff toxin NEGATIVE NEGATIVE Final   C Diff interpretation No C. difficile detected.  Final    Comment: Performed at [facility], [place] [address], [place], [place] [address]     Labs: BNP (last 3 results) No results for input(s): "BNP" in the last 8760 hours. Basic Metabolic Panel: Recent Labs  Lab 01/31/23 1010 02/01/23 0821 02/02/23 0144  NA 138 141 137  K  2.9* 3.3* 3.3*  CL 101 106 108  CO2 21* 24 22  GLUCOSE 175* 113* 101*  BUN '21 17 17  '$ CREATININE 1.85* 1.68* 1.66*  CALCIUM 9.1 8.5* 7.9*  MG 1.6* 1.6* 1.7   Liver Function Tests: Recent Labs  Lab 01/31/23 1010 02/02/23 0144  AST 26 18  ALT 14 12  ALKPHOS 77 46  BILITOT 0.9 0.4  PROT 6.5 4.7*  ALBUMIN 2.6* 1.8*   Recent Labs  Lab 01/31/23 1010  LIPASE 25   No results for input(s): "AMMONIA" in the last 168 hours. CBC: Recent Labs  Lab 01/31/23 1010 02/01/23 0821 02/02/23 0144  WBC 26.0* 16.8* 10.5  NEUTROABS 23.4*  --  8.0*  HGB 12.2 11.0* 9.2*  HCT 36.3 32.6* 27.8*  MCV 90.3 91.3 91.1  PLT 240 219 186   Cardiac Enzymes: No results for input(s): "CKTOTAL", "CKMB", "CKMBINDEX", "TROPONINI" in the last 168 hours. BNP: Invalid input(s): "POCBNP" CBG: No results for input(s): "GLUCAP" in the last 168 hours. D-Dimer No results for input(s): "DDIMER" in the last 72 hours. Hgb A1c No results for input(s): "HGBA1C" in the last 72 hours. Lipid Profile No results for input(s): "CHOL", "HDL", "LDLCALC", "TRIG", "CHOLHDL", "LDLDIRECT" in the last 72 hours. Thyroid function studies No results for input(s): "TSH", "T4TOTAL", "T3FREE", "THYROIDAB" in the last 72 hours.  Invalid input(s): "FREET3" Anemia work up No results for input(s): "VITAMINB12", "FOLATE", "FERRITIN", "TIBC", "IRON", "RETICCTPCT" in the last 72 hours. Urinalysis    Component Value Date/Time   COLORURINE YELLOW 01/31/2023 1010   APPEARANCEUR CLEAR 01/31/2023 1010   LABSPEC 1.020 01/31/2023  1010   PHURINE 6.0 01/31/2023 1010   GLUCOSEU >=500 (A) 01/31/2023 1010   HGBUR NEGATIVE 01/31/2023 1010   BILIRUBINUR NEGATIVE 01/31/2023 1010   KETONESUR 5 (A) 01/31/2023 1010   PROTEINUR >=300 (A) 01/31/2023 1010   UROBILINOGEN 0.2 03/09/2009 1029   NITRITE NEGATIVE 01/31/2023 1010   LEUKOCYTESUR NEGATIVE 01/31/2023 1010   Sepsis Labs Recent Labs  Lab 01/31/23 1010 02/01/23 0821 02/02/23 0144   WBC 26.0* 16.8* 10.5   Microbiology Recent Results (from the past 240 hour(s))  Resp panel by RT-PCR (RSV, Flu A&B, Covid) Anterior Nasal Swab     Status: None   Collection Time: 01/31/23 10:10 AM   Specimen: Anterior Nasal Swab  Result Value Ref Range Status   SARS Coronavirus 2 by RT PCR NEGATIVE NEGATIVE Final   Influenza A by PCR NEGATIVE NEGATIVE Final   Influenza B by PCR NEGATIVE NEGATIVE Final    Comment: (NOTE) The Xpert Xpress SARS-CoV-2/FLU/RSV plus assay is intended as an aid in the diagnosis of influenza from Nasopharyngeal swab specimens and should not be used as a sole basis for treatment. Nasal washings and aspirates are unacceptable for Xpert Xpress SARS-CoV-2/FLU/RSV testing.  Fact Sheet for Patients: EntrepreneurPulse.com.au  Fact Sheet for Healthcare Providers: IncredibleEmployment.be  This test is not yet approved or cleared by the [place] FDA and has been authorized for detection and/or diagnosis of SARS-CoV-2 by FDA under an Emergency Use Authorization (EUA). This EUA will remain in effect (meaning this test can be used) for the duration of the COVID-19 declaration under Section 564(b)(1) of the Act, 21 U.S.C. section 360bbb-3(b)(1), unless the authorization is terminated or revoked.     Resp Syncytial Virus by PCR NEGATIVE NEGATIVE Final    Comment: (NOTE) Fact Sheet for Patients: EntrepreneurPulse.com.au  Fact Sheet for Healthcare Providers: IncredibleEmployment.be  This test is not yet approved or cleared by the [place] FDA and has been authorized for detection and/or diagnosis of SARS-CoV-2 by FDA under an Emergency Use Authorization (EUA). This EUA will remain in effect (meaning this test can be used) for the duration of the COVID-19 declaration under Section 564(b)(1) of the Act, 21 U.S.C. section 360bbb-3(b)(1), unless the authorization is terminated  or revoked.  Performed at [facility], [place] [address], [place], [place] [address]   C Difficile Quick Screen w PCR reflex     Status: None   Collection Time: 02/01/23  8:00 AM   Specimen: STOOL  Result Value Ref Range Status   C Diff antigen NEGATIVE NEGATIVE Final   C Diff toxin NEGATIVE NEGATIVE Final   C Diff interpretation No C. difficile detected.  Final    Comment: Performed at [facility], [place] [address], [place], [place] [address]     Time coordinating discharge: Over 30 minutes  SIGNED:   Darliss Cheney, MD  [facility] 02/02/2023, 10:29 AM *Please note that this is a verbal dictation therefore any spelling or grammatical errors are due to the "[place] [employer]" system interpretation. If 7PM-7AM, please contact night-coverage [email or web address]

## 2023-02-02 NOTE — Progress Notes (Signed)
DISCHARGE NOTE HOME Janice [name] to be discharged Home per MD order. Discussed prescriptions and follow up appointments with the patient. Prescriptions given to patient; medication list explained in detail. Patient verbalized understanding.  Skin clean, dry and intact without evidence of skin break down, no evidence of skin tears noted. IV catheter discontinued intact. Site without signs and symptoms of complications. Dressing and pressure applied. Pt denies pain at the site currently. No complaints noted.  Patient free of lines, drains, and wounds.   An After Visit Summary (AVS) was printed and given to the patient. Patient escorted via wheelchair, and discharged home via private auto.  Anastasio Auerbach, RN

## 2023-02-02 NOTE — TOC Progression Note (Signed)
Transition of Care [facility]) - Progression Note    Patient Details  Name: Janice [name] MRN: [id number] Date of Birth: 1941-04-21  [facility]) CM/SW Contact  Graves-Bigelow, Ocie Cornfield, RN Phone Number: 02/02/2023, 12:26 PM  Clinical Narrative:  Case Manager received a secure chat for DME needs rolling walker. Family did not have an agency preference. Case Manager called [employer] for DME needs- rolling walker will be delivered to the room.    Expected Discharge Plan: [place] Barriers to Discharge: Continued Medical Work up  Expected Discharge Plan and Services   Discharge Planning Services: CM Consult Post Acute Care Choice: [place] arrangements for the past 2 months: Single Family Home Expected Discharge Date: 02/02/23                 HH Arranged: PT, OT HH Agency: [facility] Date [place]: 02/01/23   Representative spoke with at [place]: [place] Determinants of Health ([place]) Interventions SDOH Screenings   Food Insecurity: No Food Insecurity (01/31/2023)  Housing: Low Risk  (01/31/2023)  Transportation Needs: No Transportation Needs (01/31/2023)  Utilities: Not At Risk (01/31/2023)  Alcohol Screen: Low Risk  (11/13/2022)  Depression (PHQ2-9): Low Risk  (11/13/2022)  Financial Resource Strain: Low Risk  (11/13/2022)  Physical Activity: Sufficiently Active (11/13/2022)  Social Connections: Moderately Isolated (11/13/2022)  Stress: No Stress Concern Present (11/13/2022)  Tobacco Use: Medium Risk (01/31/2023)    Readmission Risk Interventions     No data to display

## 2023-02-02 NOTE — Plan of Care (Signed)

## 2023-02-04 ENCOUNTER — Telehealth: Payer: Self-pay | Admitting: *Deleted

## 2023-02-04 NOTE — Transitions of Care (Post Inpatient/ED Visit) (Signed)
   02/04/2023  Name: Janice [name] MRN: [id number] DOB: 1941/02/06  Today's TOC FU Call Status: Today's TOC FU Call Status:: Unsuccessul Call (1st Attempt) Unsuccessful Call (1st Attempt) Date: 02/04/23  Attempted to reach the patient regarding the most recent Inpatient/ED visit.  Follow Up Plan: Additional outreach attempts will be made to reach the patient to complete the Transitions of Care (Post Inpatient/ED visit) call.   [facility] [phone]

## 2023-02-05 ENCOUNTER — Telehealth: Payer: Self-pay | Admitting: *Deleted

## 2023-02-05 LAB — NOROVIRUS GROUP 1 & 2 BY PCR, STOOL
Norovirus 1 by PCR: NEGATIVE
Norovirus 2  by PCR: NEGATIVE

## 2023-02-05 NOTE — Transitions of Care (Post Inpatient/ED Visit) (Signed)
   02/05/2023  Name: Janice [name] MRN: [id number] DOB: 09/12/1941  Today's TOC FU Call Status: Today's TOC FU Call Status:: Successful TOC FU Call Competed TOC FU Call Complete Date: 02/05/23  Transition Care Management Follow-up Telephone Call Date of Discharge: 02/02/23 Discharge Facility: [facility]) Type of Discharge: Inpatient Admission Primary Inpatient Discharge Diagnosis:: intractable nausea and nausea How have you been since you were released from the hospital?: Better Any questions or concerns?: No  Items Reviewed: Did you receive and understand the discharge instructions provided?: Yes Medications obtained and verified?: Yes (Medications Reviewed) Any new allergies since your discharge?: No Dietary orders reviewed?: No Do you have support at home?: Yes People in Home: child(ren), adult Name of Support/Comfort Primary Source: [facility] and Equipment/Supplies: [employer] Ordered?: Yes Name of [place]:: Alvis Lemmings Has Agency set up a time to come to your home?: Yes [place] Visit Date: 02/06/23 Any new equipment or medical supplies ordered?: Yes (walker) Name of Medical supply agency?: [employer] Were you able to get the equipment/medical supplies?: Yes Do you have any questions related to the use of the equipment/supplies?: No  Functional Questionnaire: Do you need assistance with bathing/showering or dressing?: No Do you need assistance with meal preparation?: No Do you need assistance with eating?: No Do you have difficulty maintaining continence: No Do you need assistance with getting out of bed/getting out of a chair/moving?: No Do you have difficulty managing or taking your medications?: No  Folllow up appointments reviewed: PCP Follow-up appointment confirmed?: Yes Date of PCP follow-up appointment?: 02/08/23 Follow-up Provider: Dr Rosana Berger 1 PM [facility] Follow-up appointment confirmed?: NA Do you need  transportation to your follow-up appointment?: No Do you understand care options if your condition(s) worsen?: Yes-patient verbalized understanding  SDOH Interventions Today    Flowsheet Row Most Recent Value  SDOH Interventions   Food Insecurity Interventions Intervention Not Indicated  Housing Interventions Intervention Not Indicated  Transportation Interventions Intervention Not Indicated      Interventions Today    Flowsheet Row Most Recent Value  General Interventions   General Interventions Discussed/Reviewed Doctor Visits, Durable Medical Equipment (DME), General Interventions Discussed, General Interventions Reviewed  Doctor Visits Discussed/Reviewed Doctor Visits Discussed, Doctor Visits Reviewed       [facility] [phone]

## 2023-02-06 DIAGNOSIS — Z7982 Long term (current) use of aspirin: Secondary | ICD-10-CM | POA: Diagnosis not present

## 2023-02-06 DIAGNOSIS — E039 Hypothyroidism, unspecified: Secondary | ICD-10-CM | POA: Diagnosis not present

## 2023-02-06 DIAGNOSIS — I129 Hypertensive chronic kidney disease with stage 1 through stage 4 chronic kidney disease, or unspecified chronic kidney disease: Secondary | ICD-10-CM | POA: Diagnosis not present

## 2023-02-06 DIAGNOSIS — N1832 Chronic kidney disease, stage 3b: Secondary | ICD-10-CM | POA: Diagnosis not present

## 2023-02-06 DIAGNOSIS — I7090 Unspecified atherosclerosis: Secondary | ICD-10-CM | POA: Diagnosis not present

## 2023-02-06 DIAGNOSIS — N179 Acute kidney failure, unspecified: Secondary | ICD-10-CM | POA: Diagnosis not present

## 2023-02-06 DIAGNOSIS — K5730 Diverticulosis of large intestine without perforation or abscess without bleeding: Secondary | ICD-10-CM | POA: Diagnosis not present

## 2023-02-06 DIAGNOSIS — Z9181 History of falling: Secondary | ICD-10-CM | POA: Diagnosis not present

## 2023-02-06 DIAGNOSIS — D72829 Elevated white blood cell count, unspecified: Secondary | ICD-10-CM | POA: Diagnosis not present

## 2023-02-06 DIAGNOSIS — E785 Hyperlipidemia, unspecified: Secondary | ICD-10-CM | POA: Diagnosis not present

## 2023-02-06 DIAGNOSIS — J9811 Atelectasis: Secondary | ICD-10-CM | POA: Diagnosis not present

## 2023-02-06 DIAGNOSIS — J90 Pleural effusion, not elsewhere classified: Secondary | ICD-10-CM | POA: Diagnosis not present

## 2023-02-06 DIAGNOSIS — I771 Stricture of artery: Secondary | ICD-10-CM | POA: Diagnosis not present

## 2023-02-06 DIAGNOSIS — Z9049 Acquired absence of other specified parts of digestive tract: Secondary | ICD-10-CM | POA: Diagnosis not present

## 2023-02-06 DIAGNOSIS — I739 Peripheral vascular disease, unspecified: Secondary | ICD-10-CM | POA: Diagnosis not present

## 2023-02-06 DIAGNOSIS — E876 Hypokalemia: Secondary | ICD-10-CM | POA: Diagnosis not present

## 2023-02-06 DIAGNOSIS — A084 Viral intestinal infection, unspecified: Secondary | ICD-10-CM | POA: Diagnosis not present

## 2023-02-06 DIAGNOSIS — D631 Anemia in chronic kidney disease: Secondary | ICD-10-CM | POA: Diagnosis not present

## 2023-02-06 DIAGNOSIS — Z87891 Personal history of nicotine dependence: Secondary | ICD-10-CM | POA: Diagnosis not present

## 2023-02-06 DIAGNOSIS — E782 Mixed hyperlipidemia: Secondary | ICD-10-CM | POA: Diagnosis not present

## 2023-02-06 DIAGNOSIS — K439 Ventral hernia without obstruction or gangrene: Secondary | ICD-10-CM | POA: Diagnosis not present

## 2023-02-06 DIAGNOSIS — Q6102 Congenital multiple renal cysts: Secondary | ICD-10-CM | POA: Diagnosis not present

## 2023-02-07 NOTE — Progress Notes (Signed)
Janice [name] is a 82 y.o. female that presents today for Hospitalization Follow-up   HPI: Janice [name] is a 82 year old female here for hospital follow up. She is here with her daughter.   Discharge Date: 01/31/23 Diagnosis: Viral gastroenteritis - presented with intractable nausea/vomiting, diarrhea and dizziness Procedures/tests: C Diff negative, GI pathogen panel negative, Norovirus negative  Consultants: None New medications: None Discontinued medications: None Discharge instructions:  Recheck BMP, CBC in 1 week Status: better  Hypertension -Medications: Losartan 50 mg (recently decreased), Norvasc 5 mg (decreased by Nephrology), Metoprolol 25 mg BID -She is compliant with above medications and reports no side effects -Checking BP at home: average about 129-130/80 -Denies any SOB, CP, vision changes or symptoms of hypotension -Does endorse chronic BLE swelling, R>L since vascular surgery  HLD -Medications: Crestor 20 mg -She is compliant with above medications and reports no side effects -Last lipid panel Lipid Panel     Component Value Date/Time   CHOL 162 09/11/2022 1458   CHOL 147 11/21/2015 1001   TRIG 182 (H) 09/11/2022 1458   HDL 55 09/11/2022 1458   HDL 51 11/21/2015 1001   CHOLHDL 2.9 09/11/2022 1458   VLDL 34 (H) 11/21/2016 1006   LDLCALC 79 09/11/2022 1458   LABVLDL 25 11/21/2015 1001   Pre-Diabetes: -Last A1c 10/22 5.5% -Not currently on any medication  CKD3 -Currently following with Nephrology, sees them every 6 months as well, last seen on 08/15/22 -Last Creatinine 1.49, GFR 35 8/23 -Currently taking Farxiga 10 mg per Neprho, reports no side effects  Hypothyroidism -Medications: Levothyroxine 112 mcg  -Last TSH 10/23 2.78 -Doing well since increase in dose, denies anxiety, weight loss, lack of appetite, palpitations, skin changes  Vitamin D Deficiency: -Not interested in pursuing DEXA  -Currently on Calcitrol per Nephrology   Review of Systems   Constitutional:  Negative for appetite change, chills, fever and unexpected weight change.  Eyes:  Negative for visual disturbance.  Respiratory:  Negative for cough and shortness of breath.   Cardiovascular:  Negative for chest pain, palpitations and leg swelling.  Gastrointestinal:  Negative for abdominal pain, blood in stool, constipation, diarrhea, nausea and vomiting.  Genitourinary:  Negative for dysuria and hematuria.  Skin: Negative.     PAST MEDICAL, SURGICAL, FAMILY AND SOCIAL HISTORY: Reviewed in Epic and signed.  VITALS: Today's Vitals   02/08/23 1301  BP: 118/64  Pulse: 93  Resp: 18  Temp: 98 F (36.7 C)  SpO2: 98%  Weight: 168 lb 3.2 oz (76.3 kg)  Height: 5\' 2"  (1.575 m)   Body mass index is 30.76 kg/m.   PHYSICAL EXAM: Physical Exam Constitutional:      Appearance: Normal appearance.  HENT:     Head: Normocephalic and atraumatic.  Eyes:     Conjunctiva/sclera: Conjunctivae normal.  Cardiovascular:     Rate and Rhythm: Normal rate and regular rhythm.  Pulmonary:     Effort: Pulmonary effort is normal.     Breath sounds: Normal breath sounds.  Skin:    General: Skin is warm and dry.  Neurological:     General: No focal deficit present.     Mental Status: She is alert. Mental status is at baseline.  Psychiatric:        Mood and Affect: Mood normal.        Behavior: Behavior normal.     Assessment & Plan:  1. Hospital discharge follow-up: Discharge summary from 02/02/23 reviewed, along with inpatient labs. Nausea/vomiting has resolved  and she is eating and voiding appropriately.   2. AKI (acute kidney injury) (HCC)/Hypokalemia: Potassium slightly low at 3.3 on day of discharge, recheck kidney function and electrolytes today.  - Basic Metabolic Panel (BMET)  3. Hypertensive renal disease with renal failure, 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 unspecified chronic kidney disease: Blood pressure well controlled since decreasing Losartan 50 mg, will send new dose to  pharmacy.   - Basic Metabolic Panel (BMET) - losartan (COZAAR) 50 MG tablet; Take 1 tablet (50 mg total) by mouth daily.  Dispense: 90 tablet; Refill: 1  4. Anemia of chronic renal failure, stage 3 (moderate), unspecified whether stage 3a or 3b CKD ([place]): Hgb 9.2 while inpatient, patient denies any abnormal bleeding. She is not on a blood thinner but is on a baby aspirin daily for PVD. Recheck CBC.  - CBC w/Diff/Platelet   Return for already scheduled .

## 2023-02-08 ENCOUNTER — Ambulatory Visit (INDEPENDENT_AMBULATORY_CARE_PROVIDER_SITE_OTHER): Payer: Medicare Other | Admitting: Internal Medicine

## 2023-02-08 ENCOUNTER — Telehealth: Payer: Self-pay | Admitting: Internal Medicine

## 2023-02-08 ENCOUNTER — Encounter: Payer: Self-pay | Admitting: Internal Medicine

## 2023-02-08 VITALS — BP 118/64 | HR 93 | Temp 98.0°F | Resp 18 | Ht 62.0 in | Wt 168.2 lb

## 2023-02-08 DIAGNOSIS — N179 Acute kidney failure, unspecified: Secondary | ICD-10-CM

## 2023-02-08 DIAGNOSIS — Z09 Encounter for follow-up examination after completed treatment for conditions other than malignant neoplasm: Secondary | ICD-10-CM | POA: Diagnosis not present

## 2023-02-08 DIAGNOSIS — E876 Hypokalemia: Secondary | ICD-10-CM | POA: Diagnosis not present

## 2023-02-08 DIAGNOSIS — I129 Hypertensive chronic kidney disease with stage 1 through stage 4 chronic kidney disease, or unspecified chronic kidney disease: Secondary | ICD-10-CM

## 2023-02-08 DIAGNOSIS — D631 Anemia in chronic kidney disease: Secondary | ICD-10-CM

## 2023-02-08 DIAGNOSIS — N183 Chronic kidney disease, stage 3 unspecified: Secondary | ICD-10-CM

## 2023-02-08 MED ORDER — LOSARTAN POTASSIUM 50 MG PO TABS: 50.0000 mg | ORAL_TABLET | Freq: Every day | ORAL | 1 refills | Status: DC

## 2023-02-08 NOTE — Telephone Encounter (Signed)
Copied from [place] [phone]. Topic: General - Inquiry >> Feb 08, 2023  1:38 PM Penni Bombard wrote: Reason for CRM: Merrie Roof with [facility] called to say patient cancelled for OT today so he is going Monday.

## 2023-02-08 NOTE — Patient Instructions (Signed)
It was great seeing you today!  Plan discussed at today's visit: -Blood work ordered today, results will be uploaded to MyChart.  -Losartan 50 mg prescribed -Continue to increase oral hydration  Follow up in: April, already scheduled   Take care and let us know if you have any questions or concerns prior to your next visit.  Dr. Rosana Berger

## 2023-02-09 LAB — CBC WITH DIFFERENTIAL/PLATELET
Absolute Monocytes: 756 cells/uL (ref 200–950)
Basophils Absolute: 85 cells/uL (ref 0–200)
Basophils Relative: 0.7 %
Eosinophils Absolute: 134 cells/uL (ref 15–500)
Eosinophils Relative: 1.1 %
HCT: 35 % (ref 35.0–45.0)
Hemoglobin: 11.4 g/dL — ABNORMAL LOW (ref 11.7–15.5)
Lymphs Abs: 1025 cells/uL (ref 850–3900)
MCH: 29.7 pg (ref 27.0–33.0)
MCHC: 32.6 g/dL (ref 32.0–36.0)
MCV: 91.1 fL (ref 80.0–100.0)
MPV: 11.2 fL (ref 7.5–12.5)
Monocytes Relative: 6.2 %
Neutro Abs: 10199 cells/uL — ABNORMAL HIGH (ref 1500–7800)
Neutrophils Relative %: 83.6 %
Platelets: 277 10*3/uL (ref 140–400)
RBC: 3.84 10*6/uL (ref 3.80–5.10)
RDW: 12.9 % (ref 11.0–15.0)
Total Lymphocyte: 8.4 %
WBC: 12.2 10*3/uL — ABNORMAL HIGH (ref 3.8–10.8)

## 2023-02-09 LAB — BASIC METABOLIC PANEL
BUN/Creatinine Ratio: 10 (calc) (ref 6–22)
BUN: 17 mg/dL (ref 7–25)
CO2: 27 mmol/L (ref 20–32)
Calcium: 8.6 mg/dL (ref 8.6–10.4)
Chloride: 104 mmol/L (ref 98–110)
Creat: 1.64 mg/dL — ABNORMAL HIGH (ref 0.60–0.95)
Glucose, Bld: 96 mg/dL (ref 65–99)
Potassium: 4.6 mmol/L (ref 3.5–5.3)
Sodium: 141 mmol/L (ref 135–146)

## 2023-02-11 DIAGNOSIS — N179 Acute kidney failure, unspecified: Secondary | ICD-10-CM | POA: Diagnosis not present

## 2023-02-11 DIAGNOSIS — I129 Hypertensive chronic kidney disease with stage 1 through stage 4 chronic kidney disease, or unspecified chronic kidney disease: Secondary | ICD-10-CM | POA: Diagnosis not present

## 2023-02-11 DIAGNOSIS — E785 Hyperlipidemia, unspecified: Secondary | ICD-10-CM | POA: Diagnosis not present

## 2023-02-11 DIAGNOSIS — D631 Anemia in chronic kidney disease: Secondary | ICD-10-CM | POA: Diagnosis not present

## 2023-02-11 DIAGNOSIS — A084 Viral intestinal infection, unspecified: Secondary | ICD-10-CM | POA: Diagnosis not present

## 2023-02-11 DIAGNOSIS — N1832 Chronic kidney disease, stage 3b: Secondary | ICD-10-CM | POA: Diagnosis not present

## 2023-02-12 ENCOUNTER — Ambulatory Visit: Payer: Self-pay

## 2023-02-12 ENCOUNTER — Ambulatory Visit: Payer: Medicare Other | Admitting: Physician Assistant

## 2023-02-12 ENCOUNTER — Other Ambulatory Visit: Payer: Self-pay | Admitting: Podiatry

## 2023-02-12 ENCOUNTER — Encounter: Payer: Self-pay | Admitting: *Deleted

## 2023-02-12 ENCOUNTER — Other Ambulatory Visit: Payer: Self-pay | Admitting: Internal Medicine

## 2023-02-12 DIAGNOSIS — R9431 Abnormal electrocardiogram [ECG] [EKG]: Secondary | ICD-10-CM | POA: Diagnosis not present

## 2023-02-12 DIAGNOSIS — I10 Essential (primary) hypertension: Secondary | ICD-10-CM | POA: Diagnosis not present

## 2023-02-12 DIAGNOSIS — D72829 Elevated white blood cell count, unspecified: Secondary | ICD-10-CM | POA: Diagnosis not present

## 2023-02-12 DIAGNOSIS — E039 Hypothyroidism, unspecified: Secondary | ICD-10-CM

## 2023-02-12 DIAGNOSIS — E782 Mixed hyperlipidemia: Secondary | ICD-10-CM

## 2023-02-12 DIAGNOSIS — I447 Left bundle-branch block, unspecified: Secondary | ICD-10-CM | POA: Diagnosis not present

## 2023-02-12 DIAGNOSIS — R42 Dizziness and giddiness: Secondary | ICD-10-CM | POA: Diagnosis not present

## 2023-02-12 DIAGNOSIS — R112 Nausea with vomiting, unspecified: Secondary | ICD-10-CM | POA: Diagnosis not present

## 2023-02-12 DIAGNOSIS — K388 Other specified diseases of appendix: Secondary | ICD-10-CM | POA: Diagnosis not present

## 2023-02-12 NOTE — Telephone Encounter (Signed)
Patient's daughter Janice [name] on [place],  called to cancel appt today. Reports patient can not stand and needs max assist going to bathroom , with mobility. Instructed patient's daughter to call 911 to have patient evaluated. Daughter is very upset patient was released from hospital and concerned regarding elevated WBC count. See previous triage today .

## 2023-02-12 NOTE — Telephone Encounter (Signed)
  Chief Complaint: Vomiting, queasy Symptoms: Vomiting , queasy Frequency: today Pertinent Negatives: Patient denies Diarrhea, abdominal pain Disposition: [] ED /[] Urgent Care (no appt availability in office) / [x] Appointment(In office/virtual)/ []  [place] [facility]/ [] Home Care/ [] Refused Recommended Disposition /[] [facility] [facility]/ []  Follow-up with PCP Additional Notes: Spoke with daughter, Butch Penny. PT was recently hospitalized for vomiting. Pt was fine unit today. She has started vomiting  again and is nauseous.  Lab work revealed increase in Pilger blood cells. Daughter is concerned that this is showing an infection and is wondering how this should be treated.     Summary: Vomiting   Pt has been experiencing nausea and vomiting this morning. Pt's daughter Butch Penny [phone])  is calling in with pt beside them. Has concerns on if the lab results they were given have any connection with the symptoms.     Reason for Disposition  [1] MILD or MODERATE vomiting AND [2] present > 48 hours (2 days) (Exception: Mild vomiting with associated diarrhea.)  Answer Assessment - Initial Assessment Questions 1. VOMITING SEVERITY: "How many times have you vomited in the past 24 hours?"     - MILD:  1 - 2 times/day    - MODERATE: 3 - 5 times/day, decreased oral intake without significant weight loss or symptoms of dehydration    - SEVERE: 6 or more times/day, vomits everything or nearly everything, with significant weight loss, symptoms of dehydration      2x 2. ONSET: "When did the vomiting begin?"      Stared vomiting again today 3. FLUIDS: "What fluids or food have you vomited up today?" "Have you been able to keep any fluids down?"     Yes 4. ABDOMEN PAIN: "Are your having any abdomen pain?" If Yes : "How bad is it and what does it feel like?" (e.g., crampy, dull, intermittent, constant)      No  -just queasy 5. DIARRHEA: "Is there any diarrhea?" If Yes, ask: "How many times today?"       no 6. CONTACTS: "Is there anyone else in the family with the same symptoms?"      no 7. CAUSE: "What do you think is causing your vomiting?"     Infection  Protocols used: Vomiting-A-AH

## 2023-02-12 NOTE — Telephone Encounter (Signed)
This encounter was created in error - please disregard.

## 2023-02-13 NOTE — Telephone Encounter (Signed)
Requested Prescriptions  Pending Prescriptions Disp Refills   rosuvastatin (CRESTOR) 20 MG tablet [Pharmacy Med Name: ROSUVASTATIN CALCIUM 20 MG TAB] 90 tablet 1    Sig: TAKE 1 TABLET BY MOUTH EVERYDAY AT BEDTIME     Cardiovascular:  Antilipid - Statins 2 Failed - 02/12/2023  6:55 PM      Failed - Cr in normal range and within 360 days    Creat  Date Value Ref Range Status  02/08/2023 1.64 (H) 0.60 - 0.95 mg/dL Final   Creatinine, Urine  Date Value Ref Range Status  10/08/2017 23 20 - 275 mg/dL Final         Failed - Lipid Panel in normal range within the last 12 months    Cholesterol, Total  Date Value Ref Range Status  11/21/2015 147 100 - 199 mg/dL Final   Cholesterol  Date Value Ref Range Status  09/11/2022 162 <200 mg/dL Final   LDL Cholesterol (Calc)  Date Value Ref Range Status  09/11/2022 79 mg/dL (calc) Final    Comment:    Reference range: <100 . Desirable range <100 mg/dL for primary prevention;   <70 mg/dL for patients with CHD or diabetic patients  with > or = 2 CHD risk factors. Marland Kitchen LDL-C is now calculated using the Martin-Hopkins  calculation, which is a validated novel method providing  better accuracy than the Friedewald equation in the  estimation of LDL-C.  Cresenciano Genre et al. Annamaria Helling. [id number]): 2061-2068  (http://education.[employer].com/faq/FAQ164)    HDL  Date Value Ref Range Status  09/11/2022 55 > OR = 50 mg/dL Final  11/21/2015 51 >39 mg/dL Final   Triglycerides  Date Value Ref Range Status  09/11/2022 182 (H) <150 mg/dL Final         Passed - Patient is not pregnant      Passed - Valid encounter within last 12 months    Recent Outpatient Visits           5 days ago Hospital discharge follow-up   [facility] Teodora Medici, DO   3 months ago Encounter for [employer] annual wellness exam   [facility] Mecum, Dani Gobble, PA-C   5 months ago Hypertensive renal disease with renal  failure, 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 unspecified chronic kidney disease   [facility] Teodora Medici, DO   11 months ago Hypertensive renal disease with renal failure, 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 unspecified chronic kidney disease   [facility] Teodora Medici, DO   1 year ago Need for influenza vaccination   [facility] Teodora Medici, DO       Future Appointments             In 9 months [facility]              levothyroxine (SYNTHROID) 112 MCG tablet [Pharmacy Med Name: LEVOTHYROXINE 112 MCG TABLET] 90 tablet 1    Sig: TAKE 1 TABLET BY MOUTH EVERY DAY     Endocrinology:  Hypothyroid Agents Passed - 02/12/2023  6:55 PM      Passed - TSH in normal range and within 360 days    TSH  Date Value Ref Range Status  09/11/2022 2.78 0.40 - 4.50 mIU/L Final         Passed - Valid encounter within last 12 months    Recent Outpatient Visits           5  days ago Hospital discharge follow-up   [facility] Teodora Medici, DO   3 months ago Encounter for [employer] annual wellness exam   [facility] Mecum, Dani Gobble, PA-C   5 months ago Hypertensive renal disease with renal failure, 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 unspecified chronic kidney disease   [facility] Teodora Medici, [place]   11 months ago Hypertensive renal disease with renal failure, 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 unspecified chronic kidney disease   [facility] Teodora Medici, DO   1 year ago Need for influenza vaccination   [facility] Teodora Medici, DO       Future Appointments             In 9 months [facility], [facility]

## 2023-02-18 DIAGNOSIS — I129 Hypertensive chronic kidney disease with stage 1 through stage 4 chronic kidney disease, or unspecified chronic kidney disease: Secondary | ICD-10-CM | POA: Diagnosis not present

## 2023-02-18 DIAGNOSIS — E785 Hyperlipidemia, unspecified: Secondary | ICD-10-CM | POA: Diagnosis not present

## 2023-02-18 DIAGNOSIS — N179 Acute kidney failure, unspecified: Secondary | ICD-10-CM | POA: Diagnosis not present

## 2023-02-18 DIAGNOSIS — D631 Anemia in chronic kidney disease: Secondary | ICD-10-CM | POA: Diagnosis not present

## 2023-02-18 DIAGNOSIS — N1832 Chronic kidney disease, stage 3b: Secondary | ICD-10-CM | POA: Diagnosis not present

## 2023-02-18 DIAGNOSIS — A084 Viral intestinal infection, unspecified: Secondary | ICD-10-CM | POA: Diagnosis not present

## 2023-02-27 DIAGNOSIS — D631 Anemia in chronic kidney disease: Secondary | ICD-10-CM | POA: Diagnosis not present

## 2023-02-27 DIAGNOSIS — E785 Hyperlipidemia, unspecified: Secondary | ICD-10-CM | POA: Diagnosis not present

## 2023-02-27 DIAGNOSIS — A084 Viral intestinal infection, unspecified: Secondary | ICD-10-CM | POA: Diagnosis not present

## 2023-02-27 DIAGNOSIS — N179 Acute kidney failure, unspecified: Secondary | ICD-10-CM | POA: Diagnosis not present

## 2023-02-27 DIAGNOSIS — I129 Hypertensive chronic kidney disease with stage 1 through stage 4 chronic kidney disease, or unspecified chronic kidney disease: Secondary | ICD-10-CM | POA: Diagnosis not present

## 2023-02-27 DIAGNOSIS — N1832 Chronic kidney disease, stage 3b: Secondary | ICD-10-CM | POA: Diagnosis not present

## 2023-03-04 DIAGNOSIS — D631 Anemia in chronic kidney disease: Secondary | ICD-10-CM | POA: Diagnosis not present

## 2023-03-04 DIAGNOSIS — E785 Hyperlipidemia, unspecified: Secondary | ICD-10-CM | POA: Diagnosis not present

## 2023-03-04 DIAGNOSIS — N179 Acute kidney failure, unspecified: Secondary | ICD-10-CM | POA: Diagnosis not present

## 2023-03-04 DIAGNOSIS — N1832 Chronic kidney disease, stage 3b: Secondary | ICD-10-CM | POA: Diagnosis not present

## 2023-03-04 DIAGNOSIS — A084 Viral intestinal infection, unspecified: Secondary | ICD-10-CM | POA: Diagnosis not present

## 2023-03-04 DIAGNOSIS — I129 Hypertensive chronic kidney disease with stage 1 through stage 4 chronic kidney disease, or unspecified chronic kidney disease: Secondary | ICD-10-CM | POA: Diagnosis not present

## 2023-03-08 DIAGNOSIS — K5730 Diverticulosis of large intestine without perforation or abscess without bleeding: Secondary | ICD-10-CM | POA: Diagnosis not present

## 2023-03-08 DIAGNOSIS — Z87891 Personal history of nicotine dependence: Secondary | ICD-10-CM | POA: Diagnosis not present

## 2023-03-08 DIAGNOSIS — Z9049 Acquired absence of other specified parts of digestive tract: Secondary | ICD-10-CM | POA: Diagnosis not present

## 2023-03-08 DIAGNOSIS — A084 Viral intestinal infection, unspecified: Secondary | ICD-10-CM | POA: Diagnosis not present

## 2023-03-08 DIAGNOSIS — I771 Stricture of artery: Secondary | ICD-10-CM | POA: Diagnosis not present

## 2023-03-08 DIAGNOSIS — K439 Ventral hernia without obstruction or gangrene: Secondary | ICD-10-CM | POA: Diagnosis not present

## 2023-03-08 DIAGNOSIS — Q6102 Congenital multiple renal cysts: Secondary | ICD-10-CM | POA: Diagnosis not present

## 2023-03-08 DIAGNOSIS — J90 Pleural effusion, not elsewhere classified: Secondary | ICD-10-CM | POA: Diagnosis not present

## 2023-03-08 DIAGNOSIS — I7090 Unspecified atherosclerosis: Secondary | ICD-10-CM | POA: Diagnosis not present

## 2023-03-08 DIAGNOSIS — E039 Hypothyroidism, unspecified: Secondary | ICD-10-CM | POA: Diagnosis not present

## 2023-03-08 DIAGNOSIS — D72829 Elevated white blood cell count, unspecified: Secondary | ICD-10-CM | POA: Diagnosis not present

## 2023-03-08 DIAGNOSIS — N1832 Chronic kidney disease, stage 3b: Secondary | ICD-10-CM | POA: Diagnosis not present

## 2023-03-08 DIAGNOSIS — Z7982 Long term (current) use of aspirin: Secondary | ICD-10-CM | POA: Diagnosis not present

## 2023-03-08 DIAGNOSIS — D631 Anemia in chronic kidney disease: Secondary | ICD-10-CM | POA: Diagnosis not present

## 2023-03-08 DIAGNOSIS — I129 Hypertensive chronic kidney disease with stage 1 through stage 4 chronic kidney disease, or unspecified chronic kidney disease: Secondary | ICD-10-CM | POA: Diagnosis not present

## 2023-03-08 DIAGNOSIS — J9811 Atelectasis: Secondary | ICD-10-CM | POA: Diagnosis not present

## 2023-03-08 DIAGNOSIS — N179 Acute kidney failure, unspecified: Secondary | ICD-10-CM | POA: Diagnosis not present

## 2023-03-08 DIAGNOSIS — E876 Hypokalemia: Secondary | ICD-10-CM | POA: Diagnosis not present

## 2023-03-08 DIAGNOSIS — Z9181 History of falling: Secondary | ICD-10-CM | POA: Diagnosis not present

## 2023-03-08 DIAGNOSIS — E785 Hyperlipidemia, unspecified: Secondary | ICD-10-CM | POA: Diagnosis not present

## 2023-03-08 DIAGNOSIS — I739 Peripheral vascular disease, unspecified: Secondary | ICD-10-CM | POA: Diagnosis not present

## 2023-03-08 DIAGNOSIS — E782 Mixed hyperlipidemia: Secondary | ICD-10-CM | POA: Diagnosis not present

## 2023-03-11 DIAGNOSIS — D631 Anemia in chronic kidney disease: Secondary | ICD-10-CM | POA: Diagnosis not present

## 2023-03-11 DIAGNOSIS — A084 Viral intestinal infection, unspecified: Secondary | ICD-10-CM | POA: Diagnosis not present

## 2023-03-11 DIAGNOSIS — E785 Hyperlipidemia, unspecified: Secondary | ICD-10-CM | POA: Diagnosis not present

## 2023-03-11 DIAGNOSIS — I129 Hypertensive chronic kidney disease with stage 1 through stage 4 chronic kidney disease, or unspecified chronic kidney disease: Secondary | ICD-10-CM | POA: Diagnosis not present

## 2023-03-11 DIAGNOSIS — N1832 Chronic kidney disease, stage 3b: Secondary | ICD-10-CM | POA: Diagnosis not present

## 2023-03-11 DIAGNOSIS — N179 Acute kidney failure, unspecified: Secondary | ICD-10-CM | POA: Diagnosis not present

## 2023-03-14 ENCOUNTER — Ambulatory Visit (INDEPENDENT_AMBULATORY_CARE_PROVIDER_SITE_OTHER): Payer: Medicare Other | Admitting: Podiatry

## 2023-03-14 ENCOUNTER — Ambulatory Visit: Payer: Medicare Other | Admitting: Podiatry

## 2023-03-14 VITALS — BP 137/56 | HR 79

## 2023-03-14 DIAGNOSIS — B351 Tinea unguium: Secondary | ICD-10-CM

## 2023-03-14 DIAGNOSIS — B372 Candidiasis of skin and nail: Secondary | ICD-10-CM | POA: Diagnosis not present

## 2023-03-14 DIAGNOSIS — B353 Tinea pedis: Secondary | ICD-10-CM | POA: Diagnosis not present

## 2023-03-14 DIAGNOSIS — I739 Peripheral vascular disease, unspecified: Secondary | ICD-10-CM

## 2023-03-14 MED ORDER — KETOCONAZOLE 2 % EX CREA: TOPICAL_CREAM | CUTANEOUS | 1 refills | Status: DC

## 2023-03-14 NOTE — Patient Instructions (Addendum)
Purchase Neosporin Cream for right 3rd toe.  PLAIN EPSOM SALT FOOT SOAK INSTRUCTIONS  Shopping List:  A. Plain epsom salt (not scented) B. Neosporin Cream C. 1-inch fabric band-aids   Place 1/4 cup of epsom salts in 2 quarts of warm tap water. IF YOU ARE DIABETIC, OR HAVE NEUROPATHY, CHECK THE TEMPERATURE OF THE WATER WITH YOUR ELBOW.   Submerge your foot/feet in the solution and soak for 10-15 minutes.      3.  Next, remove your foot/feet from solution, blot dry the affected area.    4.  Apply light amount of antibiotic cream and cover with fabric band-aid .  5.  This soak should be done once a day for 7 days.   6.  Monitor for any signs/symptoms of infection such as redness, swelling, odor, drainage, increased pain, or non-healing of digit.   7.  Please do not hesitate to call the office and speak to a Nurse or Doctor if you have questions.   8.  If you experience fever, chills, nightsweats, nausea or vomiting with worsening of digit/foot, please go to the emergency room.   Athlete's Foot Athlete's foot (tinea pedis) is a fungal infection of the skin on your feet. It often occurs on the skin that is between or underneath the toes. It can also occur on the soles of your feet. The infection can spread from person to person (is contagious). It can also spread when a person's bare feet come in contact with the fungus on shower floors or on items such as shoes. What are the causes? This condition is caused by a fungus that grows in warm, moist places. You can get athlete's foot by sharing shoes, shower stalls, towels, and wet floors with someone who is infected. Not washing your feet or changing your socks often enough can also lead to athlete's foot. What increases the risk? This condition is more likely to develop in: Men. People who have a weak body defense system (immune system). People who have diabetes. People who use public showers, such as at a gym. People who wear heavy-duty  shoes, such as Youth worker. Seasons with warm, humid weather. What are the signs or symptoms? Symptoms of this condition include: Itchy areas between your toes or on the soles of your feet. White, flaky, or scaly areas between your toes or on the soles of your feet. Very itchy small blisters between your toes or on the soles of your feet. Small cuts in your skin. These cuts can become infected. Thick or discolored toenails. How is this diagnosed? This condition may be diagnosed with a physical exam and a review of your medical history. Your health care provider may also take a skin or toenail sample to examine under a microscope. How is this treated? This condition is treated with antifungal medicines. These may be applied as powders, ointments, or creams. In severe cases, an oral antifungal medicine may be given. Follow these instructions at home: Medicines Apply or take over-the-counter and prescription medicines only as told by your health care provider. Apply your antifungal medicine as told by your health care provider. Do not stop using the antifungal even if your condition improves. Foot care Do not scratch your feet. Keep your feet dry: Wear cotton or wool socks. Change your socks every day or if they become wet. Wear shoes that allow air to flow, such as sandals or canvas tennis shoes. Wash and dry your feet, including the area between your toes.  Also, wash and dry your feet: Every day or as told by your health care provider. After exercising. General instructions Do not let others use towels, shoes, nail clippers, or other personal items that touch your feet. Protect your feet by wearing sandals in wet areas, such as locker rooms and shared showers. Keep all follow-up visits. This is important. If you have diabetes, keep your blood sugar under control. Contact a health care provider if: You have a fever. You have swelling, soreness, warmth, or redness in  your foot. Your feet are not getting better with treatment. Your symptoms get worse. You have new symptoms. You have severe pain. Summary Athlete's foot (tinea pedis) is a fungal infection of the skin on your feet. It often occurs on skin that is between or underneath the toes. This condition is caused by a fungus that grows in warm, moist places. Symptoms include white, flaky, or scaly areas between your toes or on the soles of your feet. This condition is treated with antifungal medicines. Keep your feet clean. Always dry them thoroughly. This information is not intended to replace advice given to you by your health care provider. Make sure you discuss any questions you have with your health care provider. Document Revised: 03/12/2021 Document Reviewed: 03/12/2021 Elsevier Patient Education  2023 [employer].

## 2023-03-14 NOTE — Progress Notes (Signed)
  Subjective:  Patient ID: Janice [name], female    DOB: 1941-09-07,  MRN: [id number]  Janice [name] presents to clinic today for   Chief Complaint  Patient presents with   NAIL CARE    RFC   Callouses    CALLUS RIGHT FOOT   New problem(s): None.   PCP is Margarita Mail, DO.  No Known Allergies  Review of Systems: Negative except as noted in the HPI.  Objective:  Vitals:   03/14/23 1548  BP: (!) 137/56  Pulse: 79   Janice [name] is a pleasant 82 y.o. female WD, WN in NAD. AAO x 3.  Vascular Examination: CFT <3 seconds b/l. DP pulses faintly palpable b/l. PT pulses diminished b/l. Digital hair absent. Skin temperature gradient warm to cool b/l. No pain with calf compression. No ischemia or gangrene. No cyanosis or clubbing noted b/l.    Neurological Examination: Sensation grossly intact b/l with 10 gram monofilament. Vibratory sensation intact b/l.   Dermatological Examination: Pedal skin warm and supple b/l. Toenails 1-5 b/l thick, discolored, elongated with subungual debris and pain on dorsal palpation.  No open wounds b/l LE. No interdigital macerations noted b/l LE.   Right 3rd digit: Fungal paronychia with nail border hypertrophy of proximal nailfold with granulation tissue which bleeds easily. No purulence, no penetration into deep tissues.  Diffuse scaling noted peripherally and plantarly b/l feet.  No interdigital macerations.  No blisters, no weeping. No signs of secondary bacterial infection noted.  Musculoskeletal Examination: Muscle strength 5/5 to b/l LE. No pain, crepitus or joint limitation noted with ROM bilateral LE. No gross bony deformities bilaterally.  Radiographs: None  Assessment/Plan: 1. Pain due to onychomycosis of toenails of both feet   2. Tinea pedis of both feet   3. Candidal paronychia   4. Peripheral vascular disease     Meds ordered this encounter  Medications   ketoconazole (NIZORAL) 2 % cream    Sig: Apply to both feet and  between toes once daily for 6 weeks.    Dispense:  60 g    Refill:  1   -Patient was evaluated and treated. All patient's and/or POA's questions/concerns answered on today's visit. -Nailplate debrided right 3rd toe. Lumicain Hemostatic used for hemostasis of granulation tissue. TAO and light dressing applied. Instructions dispensed for plain epsom salt soaks to be performed once daily for one week.. -Patient to continue soft, supportive shoe gear daily. -Toenails were debrided in length and girth 1-5 left foot, right great toe, R 2nd toe, R 4th toe, and R 5th toe with sterile nail nippers and dremel without iatrogenic bleeding.  -For tinea pedis, Rx sent to pharmacy for Ketoconazole Cream 2% to be applied once daily for six weeks. -Patient scheduled to see Dr. Annamary Rummage in 2 weeks for follow up of right 3rd digit paronychia. Follow up with Dr. Burna Mortimer in 3 months for nail care. -Patient/POA to call should there be question/concern in the interim.   Return in about 2 weeks (around 03/28/2023).  Freddie Breech, DPM

## 2023-03-17 ENCOUNTER — Encounter: Payer: Self-pay | Admitting: Podiatry

## 2023-03-18 DIAGNOSIS — D631 Anemia in chronic kidney disease: Secondary | ICD-10-CM | POA: Diagnosis not present

## 2023-03-18 DIAGNOSIS — N1832 Chronic kidney disease, stage 3b: Secondary | ICD-10-CM | POA: Diagnosis not present

## 2023-03-18 DIAGNOSIS — N179 Acute kidney failure, unspecified: Secondary | ICD-10-CM | POA: Diagnosis not present

## 2023-03-18 DIAGNOSIS — E785 Hyperlipidemia, unspecified: Secondary | ICD-10-CM | POA: Diagnosis not present

## 2023-03-18 DIAGNOSIS — I129 Hypertensive chronic kidney disease with stage 1 through stage 4 chronic kidney disease, or unspecified chronic kidney disease: Secondary | ICD-10-CM | POA: Diagnosis not present

## 2023-03-18 DIAGNOSIS — A084 Viral intestinal infection, unspecified: Secondary | ICD-10-CM | POA: Diagnosis not present

## 2023-03-26 ENCOUNTER — Ambulatory Visit: Payer: Medicare Other | Admitting: Internal Medicine

## 2023-03-28 DIAGNOSIS — D631 Anemia in chronic kidney disease: Secondary | ICD-10-CM | POA: Diagnosis not present

## 2023-03-28 DIAGNOSIS — N179 Acute kidney failure, unspecified: Secondary | ICD-10-CM | POA: Diagnosis not present

## 2023-03-28 DIAGNOSIS — I129 Hypertensive chronic kidney disease with stage 1 through stage 4 chronic kidney disease, or unspecified chronic kidney disease: Secondary | ICD-10-CM | POA: Diagnosis not present

## 2023-03-28 DIAGNOSIS — N1832 Chronic kidney disease, stage 3b: Secondary | ICD-10-CM | POA: Diagnosis not present

## 2023-03-28 DIAGNOSIS — A084 Viral intestinal infection, unspecified: Secondary | ICD-10-CM | POA: Diagnosis not present

## 2023-03-28 DIAGNOSIS — E785 Hyperlipidemia, unspecified: Secondary | ICD-10-CM | POA: Diagnosis not present

## 2023-04-01 NOTE — Progress Notes (Unsigned)
Janice [name] is a 82 y.o. female that presents today for No chief complaint on file.   HPI: Patient is here for follow up on chronic medical conditions. She is here with her daughter.   Hypertension -Medications: Losartan 50 mg (recently decreased), Norvasc 5 mg (decreased by Nephrology), Metoprolol 25 mg BID -She is compliant with above medications and reports no side effects -Checking BP at home: average about 129-130/80 -Denies any SOB, CP, vision changes or symptoms of hypotension -Does endorse chronic BLE swelling, R>L since vascular surgery  HLD -Medications: Crestor 20 mg -She is compliant with above medications and reports no side effects -Last lipid panel Lipid Panel     Component Value Date/Time   CHOL 162 09/11/2022 1458   CHOL 147 11/21/2015 1001   TRIG 182 (H) 09/11/2022 1458   HDL 55 09/11/2022 1458   HDL 51 11/21/2015 1001   CHOLHDL 2.9 09/11/2022 1458   VLDL 34 (H) 11/21/2016 1006   LDLCALC 79 09/11/2022 1458   LABVLDL 25 11/21/2015 1001   Pre-Diabetes: -Last A1c 10/23 5.9% -Not currently on any medication  CKD3 -Currently following with Nephrology, sees them every 6 months as well, last seen on 08/15/22 -Last Creatinine 1.64 3/24 -Currently taking Farxiga 10 mg per Neprho, reports no side effects  Hypothyroidism -Medications: Levothyroxine 112 mcg  -Last TSH 10/23 2.78 -Doing well since increase in dose, denies anxiety, weight loss, lack of appetite, palpitations, skin changes  Vitamin D Deficiency: -Not interested in pursuing DEXA  -Currently on Calcitrol per Nephrology   Review of Systems  Constitutional:  Negative for appetite change, chills, fever and unexpected weight change.  Eyes:  Negative for visual disturbance.  Respiratory:  Negative for cough and shortness of breath.   Cardiovascular:  Negative for chest pain, palpitations and leg swelling.  Gastrointestinal:  Negative for abdominal pain, blood in stool, constipation, diarrhea, nausea  and vomiting.  Genitourinary:  Negative for dysuria and hematuria.  Skin: Negative.     PAST MEDICAL, SURGICAL, FAMILY AND SOCIAL HISTORY: Reviewed in Epic and signed.  VITALS: There were no vitals filed for this visit.  There is no height or weight on file to calculate BMI.   PHYSICAL EXAM: Physical Exam Constitutional:      Appearance: Normal appearance.  HENT:     Head: Normocephalic and atraumatic.  Eyes:     Conjunctiva/sclera: Conjunctivae normal.  Cardiovascular:     Rate and Rhythm: Normal rate and regular rhythm.  Pulmonary:     Effort: Pulmonary effort is normal.     Breath sounds: Normal breath sounds.  Skin:    General: Skin is warm and dry.  Neurological:     General: No focal deficit present.     Mental Status: She is alert. Mental status is at baseline.  Psychiatric:        Mood and Affect: Mood normal.        Behavior: Behavior normal.     Assessment & Plan:  1. Hospital discharge follow-up: Discharge summary from 02/02/23 reviewed, along with inpatient labs. Nausea/vomiting has resolved and she is eating and voiding appropriately.   2. AKI (acute kidney injury) (HCC)/Hypokalemia: Potassium slightly low at 3.3 on day of discharge, recheck kidney function and electrolytes today.  - Basic Metabolic Panel (BMET)  3. Hypertensive renal disease with renal failure, 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 unspecified chronic kidney disease: Blood pressure well controlled since decreasing Losartan 50 mg, will send new dose to pharmacy.   - Basic Metabolic Panel (BMET) -  losartan (COZAAR) 50 MG tablet; Take 1 tablet (50 mg total) by mouth daily.  Dispense: 90 tablet; Refill: 1  4. Anemia of chronic renal failure, stage 3 (moderate), unspecified whether stage 3a or 3b CKD (HCC): Hgb 9.2 while inpatient, patient denies any abnormal bleeding. She is not on a blood thinner but is on a baby aspirin daily for PVD. Recheck CBC.  - CBC w/Diff/Platelet   No follow-ups on file.

## 2023-04-02 ENCOUNTER — Encounter: Payer: Self-pay | Admitting: Internal Medicine

## 2023-04-02 ENCOUNTER — Ambulatory Visit (INDEPENDENT_AMBULATORY_CARE_PROVIDER_SITE_OTHER): Payer: Medicare Other | Admitting: Internal Medicine

## 2023-04-02 VITALS — BP 132/68 | HR 77 | Temp 97.9°F | Resp 16 | Ht 62.0 in | Wt 165.6 lb

## 2023-04-02 DIAGNOSIS — E782 Mixed hyperlipidemia: Secondary | ICD-10-CM | POA: Diagnosis not present

## 2023-04-02 DIAGNOSIS — E039 Hypothyroidism, unspecified: Secondary | ICD-10-CM

## 2023-04-02 DIAGNOSIS — R7303 Prediabetes: Secondary | ICD-10-CM | POA: Diagnosis not present

## 2023-04-02 DIAGNOSIS — I129 Hypertensive chronic kidney disease with stage 1 through stage 4 chronic kidney disease, or unspecified chronic kidney disease: Secondary | ICD-10-CM | POA: Diagnosis not present

## 2023-04-02 LAB — POCT GLYCOSYLATED HEMOGLOBIN (HGB A1C): Hemoglobin A1C: 5.6 % (ref 4.0–5.6)

## 2023-04-02 MED ORDER — METOPROLOL TARTRATE 25 MG PO TABS: 25.0000 mg | ORAL_TABLET | Freq: Two times a day (BID) | ORAL | 3 refills | Status: DC

## 2023-04-02 MED ORDER — AMLODIPINE BESYLATE 5 MG PO TABS: 5.0000 mg | ORAL_TABLET | Freq: Every day | ORAL | 1 refills | Status: DC

## 2023-06-04 DIAGNOSIS — R809 Proteinuria, unspecified: Secondary | ICD-10-CM | POA: Diagnosis not present

## 2023-06-04 DIAGNOSIS — N281 Cyst of kidney, acquired: Secondary | ICD-10-CM | POA: Diagnosis not present

## 2023-06-04 DIAGNOSIS — E785 Hyperlipidemia, unspecified: Secondary | ICD-10-CM | POA: Diagnosis not present

## 2023-06-04 DIAGNOSIS — I10 Essential (primary) hypertension: Secondary | ICD-10-CM | POA: Diagnosis not present

## 2023-06-04 DIAGNOSIS — E039 Hypothyroidism, unspecified: Secondary | ICD-10-CM | POA: Diagnosis not present

## 2023-06-04 DIAGNOSIS — N2581 Secondary hyperparathyroidism of renal origin: Secondary | ICD-10-CM | POA: Diagnosis not present

## 2023-06-04 DIAGNOSIS — D631 Anemia in chronic kidney disease: Secondary | ICD-10-CM | POA: Diagnosis not present

## 2023-06-04 DIAGNOSIS — I129 Hypertensive chronic kidney disease with stage 1 through stage 4 chronic kidney disease, or unspecified chronic kidney disease: Secondary | ICD-10-CM | POA: Diagnosis not present

## 2023-06-04 DIAGNOSIS — N1832 Chronic kidney disease, stage 3b: Secondary | ICD-10-CM | POA: Diagnosis not present

## 2023-06-11 DIAGNOSIS — N281 Cyst of kidney, acquired: Secondary | ICD-10-CM | POA: Diagnosis not present

## 2023-06-11 DIAGNOSIS — D631 Anemia in chronic kidney disease: Secondary | ICD-10-CM | POA: Diagnosis not present

## 2023-06-11 DIAGNOSIS — R809 Proteinuria, unspecified: Secondary | ICD-10-CM | POA: Diagnosis not present

## 2023-06-11 DIAGNOSIS — N1832 Chronic kidney disease, stage 3b: Secondary | ICD-10-CM | POA: Diagnosis not present

## 2023-06-11 DIAGNOSIS — I129 Hypertensive chronic kidney disease with stage 1 through stage 4 chronic kidney disease, or unspecified chronic kidney disease: Secondary | ICD-10-CM | POA: Diagnosis not present

## 2023-06-11 DIAGNOSIS — N2581 Secondary hyperparathyroidism of renal origin: Secondary | ICD-10-CM | POA: Diagnosis not present

## 2023-06-20 ENCOUNTER — Ambulatory Visit: Payer: Medicare Other | Admitting: Podiatry

## 2023-06-21 ENCOUNTER — Other Ambulatory Visit: Payer: Self-pay | Admitting: Internal Medicine

## 2023-06-21 DIAGNOSIS — I129 Hypertensive chronic kidney disease with stage 1 through stage 4 chronic kidney disease, or unspecified chronic kidney disease: Secondary | ICD-10-CM

## 2023-06-21 NOTE — Telephone Encounter (Signed)
Requested Prescriptions  Refused Prescriptions Disp Refills   losartan (COZAAR) 50 MG tablet [Pharmacy Med Name: LOSARTAN POTASSIUM 50 MG TAB] 90 tablet 1    Sig: TAKE 1 TABLET BY MOUTH EVERY DAY     Cardiovascular:  Angiotensin Receptor Blockers Failed - 06/21/2023 10:24 AM      Failed - Cr in normal range and within 180 days    Creat  Date Value Ref Range Status  02/08/2023 1.64 (H) 0.60 - 0.95 mg/dL Final   Creatinine, Urine  Date Value Ref Range Status  10/08/2017 23 20 - 275 mg/dL Final         Passed - K in normal range and within 180 days    Potassium  Date Value Ref Range Status  02/08/2023 4.6 3.5 - 5.3 mmol/L Final         Passed - Patient is not pregnant      Passed - Last BP in normal range    BP Readings from Last 1 Encounters:  04/02/23 132/68         Passed - Valid encounter within last 6 months    Recent Outpatient Visits           2 months ago Hypertensive renal disease with renal failure, 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 unspecified chronic kidney disease   [facility] Margarita Mail, DO   4 months ago Hospital discharge follow-up   [facility] Margarita Mail, DO   7 months ago Encounter for [employer] annual wellness exam   [facility] Mecum, Oswaldo Conroy, PA-C   9 months ago Hypertensive renal disease with renal failure, 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 unspecified chronic kidney disease   [facility] Margarita Mail, DO   1 year ago Hypertensive renal disease with renal failure, 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 unspecified chronic kidney disease   [facility] Margarita Mail, DO       Future Appointments             In 3 months Margarita Mail, DO [facility]   In 5 months  [facility], [facility]

## 2023-06-26 ENCOUNTER — Ambulatory Visit: Payer: Medicare Other | Admitting: Podiatry

## 2023-06-26 DIAGNOSIS — M79674 Pain in right toe(s): Secondary | ICD-10-CM

## 2023-06-26 DIAGNOSIS — B351 Tinea unguium: Secondary | ICD-10-CM | POA: Diagnosis not present

## 2023-06-26 DIAGNOSIS — M79675 Pain in left toe(s): Secondary | ICD-10-CM | POA: Diagnosis not present

## 2023-06-26 NOTE — Progress Notes (Signed)
    Subjective:  Patient ID: Janice [name], female    DOB: [birth year],  MRN: [id number]   Janice [name] presents to clinic today for:  Chief Complaint  Patient presents with   Nail Problem    RFC  . Patient notes nails are thick, discolored, elongated and painful in shoegear when trying to ambulate.    PCP is Margarita Mail, DO.  No Known Allergies  Review of Systems: Negative except as noted in the HPI.  Objective:  There were no vitals filed for this visit.  Janice [name] is a pleasant 82 y.o. female in NAD. AAO x 3.  Vascular Examination: Capillary refill time is 3-5 seconds to toes bilateral. Palpable pedal pulses b/l LE. Digital hair sparse b/l.  +1 pitting edema b/l. Skin temperature gradient WNL b/l.   Dermatological Examination: Pedal skin with normal turgor, texture and tone b/l. No open wounds. No interdigital macerations b/l. Toenails x10 are 3mm thick, discolored, dystrophic with subungual debris. There is pain with compression of the nail plates.  They are elongated x10     Latest Ref Rng & Units 04/02/2023    3:10 PM 09/11/2022    2:58 PM  Hemoglobin A1C  Hemoglobin-A1c 4.0 - 5.6 % 5.6  5.9    Assessment/Plan: 1. Pain due to onychomycosis of toenails of both feet     The mycotic toenails were sharply debrided x10 with sterile nail nippers and a power debriding burr to decrease bulk/thickness and length.    Return in about 10 weeks (around 09/04/2023) for RFC.   Clerance Lav, DPM, FACFAS [facility]     [address] [place], [place] [address]                Office [phone]  Fax [phone]

## 2023-06-29 ENCOUNTER — Encounter: Payer: Self-pay | Admitting: Podiatry

## 2023-08-08 ENCOUNTER — Other Ambulatory Visit: Payer: Self-pay | Admitting: Internal Medicine

## 2023-08-08 DIAGNOSIS — I129 Hypertensive chronic kidney disease with stage 1 through stage 4 chronic kidney disease, or unspecified chronic kidney disease: Secondary | ICD-10-CM

## 2023-08-08 NOTE — Telephone Encounter (Signed)
Medication Refill - Medication: losartan (COZAAR) 50 MG tablet  Pt has 5 pills.   Has the patient contacted their pharmacy? Yes.   Pharmacy states a new prescription is needing to be sent for refill.     Preferred Pharmacy (with phone number or street name): [id number] - [place], [place] - [address]  Phone: [phone] Fax: [phone]  Has the patient been seen for an appointment in the last year OR does the patient have an upcoming appointment? Yes.    Agent: Please be advised that RX refills may take up to 3 business days. We ask that you follow-up with your pharmacy.

## 2023-08-09 MED ORDER — LOSARTAN POTASSIUM 50 MG PO TABS: 50.0000 mg | ORAL_TABLET | Freq: Every day | ORAL | 0 refills | Status: DC

## 2023-08-09 NOTE — Telephone Encounter (Signed)
Requested Prescriptions  Pending Prescriptions Disp Refills   losartan (COZAAR) 50 MG tablet 90 tablet 0    Sig: Take 1 tablet (50 mg total) by mouth daily.     Cardiovascular:  Angiotensin Receptor Blockers Failed - 08/08/2023  2:51 PM      Failed - Cr in normal range and within 180 days    Creat  Date Value Ref Range Status  02/08/2023 1.64 (H) 0.60 - 0.95 mg/dL Final   Creatinine, Urine  Date Value Ref Range Status  10/08/2017 23 20 - 275 mg/dL Final         Failed - K in normal range and within 180 days    Potassium  Date Value Ref Range Status  02/08/2023 4.6 3.5 - 5.3 mmol/L Final         Passed - Patient is not pregnant      Passed - Last BP in normal range    BP Readings from Last 1 Encounters:  04/02/23 132/68         Passed - Valid encounter within last 6 months    Recent Outpatient Visits           4 months ago Hypertensive renal disease with renal failure, 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 unspecified chronic kidney disease   [facility] Margarita Mail, DO   6 months ago Hospital discharge follow-up   [facility] Margarita Mail, DO   8 months ago Encounter for [employer] annual wellness exam   [facility] Mecum, Oswaldo Conroy, PA-C   11 months ago Hypertensive renal disease with renal failure, 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 unspecified chronic kidney disease   [facility] Margarita Mail, DO   1 year ago Hypertensive renal disease with renal failure, 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 unspecified chronic kidney disease   [facility] Margarita Mail, DO       Future Appointments             In 1 month Margarita Mail, DO [facility]   In 3 months  [facility], [facility]

## 2023-09-04 ENCOUNTER — Ambulatory Visit: Payer: Medicare Other | Admitting: Podiatry

## 2023-09-10 DIAGNOSIS — N1832 Chronic kidney disease, stage 3b: Secondary | ICD-10-CM | POA: Diagnosis not present

## 2023-09-10 DIAGNOSIS — I129 Hypertensive chronic kidney disease with stage 1 through stage 4 chronic kidney disease, or unspecified chronic kidney disease: Secondary | ICD-10-CM | POA: Diagnosis not present

## 2023-09-10 DIAGNOSIS — E039 Hypothyroidism, unspecified: Secondary | ICD-10-CM | POA: Diagnosis not present

## 2023-09-10 DIAGNOSIS — N2581 Secondary hyperparathyroidism of renal origin: Secondary | ICD-10-CM | POA: Diagnosis not present

## 2023-09-10 DIAGNOSIS — N281 Cyst of kidney, acquired: Secondary | ICD-10-CM | POA: Diagnosis not present

## 2023-09-10 DIAGNOSIS — R809 Proteinuria, unspecified: Secondary | ICD-10-CM | POA: Diagnosis not present

## 2023-09-10 DIAGNOSIS — E785 Hyperlipidemia, unspecified: Secondary | ICD-10-CM | POA: Diagnosis not present

## 2023-09-10 DIAGNOSIS — D631 Anemia in chronic kidney disease: Secondary | ICD-10-CM | POA: Diagnosis not present

## 2023-09-11 ENCOUNTER — Ambulatory Visit (INDEPENDENT_AMBULATORY_CARE_PROVIDER_SITE_OTHER): Payer: Medicare Other | Admitting: Podiatry

## 2023-09-11 DIAGNOSIS — M79674 Pain in right toe(s): Secondary | ICD-10-CM | POA: Diagnosis not present

## 2023-09-11 DIAGNOSIS — M79675 Pain in left toe(s): Secondary | ICD-10-CM | POA: Diagnosis not present

## 2023-09-11 DIAGNOSIS — B351 Tinea unguium: Secondary | ICD-10-CM | POA: Diagnosis not present

## 2023-09-11 NOTE — Progress Notes (Signed)
Subjective:  Patient ID: Janice [name], female    DOB: November 07, 1941,  MRN: [id number]  Janice [name] presents to clinic today for:  Chief Complaint  Patient presents with   Routine Foot Care    Nails, onychomycosis   Patient notes nails are thick, discolored, elongated and painful in shoegear when trying to ambulate.    PCP is Margarita Mail, DO.  No Known Allergies  Review of Systems: Negative except as noted in the HPI.  Objective:  Janice [name] is a pleasant 82 y.o. female in NAD. AAO x 3.  Vascular Examination: Capillary refill time is 3-5 seconds to toes bilateral. Palpable pedal pulses b/l LE. Digital hair sparse b/l.  +1 pitting edema b/l. Skin temperature gradient WNL b/l.   Dermatological Examination: Pedal skin with normal turgor, texture and tone b/l. No open wounds. No interdigital macerations b/l. Toenails x10 are 3mm thick, discolored, dystrophic with subungual debris. There is pain with compression of the nail plates.  They are elongated x10     Latest Ref Rng & Units 04/02/2023    3:10 PM  Hemoglobin A1C  Hemoglobin-A1c 4.0 - 5.6 % 5.6    Assessment/Plan: 1. Pain due to onychomycosis of toenails of both feet     The mycotic toenails were sharply debrided x10 with sterile nail nippers and a power debriding burr to decrease bulk/thickness and length.    Return in about 3 months (around 12/12/2023) for RFC.   Clerance Lav, DPM, FACFAS [facility]     [address] [place], [place] [address]                Office [phone]  Fax [phone]

## 2023-09-13 ENCOUNTER — Encounter: Payer: Self-pay | Admitting: Podiatry

## 2023-09-17 DIAGNOSIS — I129 Hypertensive chronic kidney disease with stage 1 through stage 4 chronic kidney disease, or unspecified chronic kidney disease: Secondary | ICD-10-CM | POA: Diagnosis not present

## 2023-09-17 DIAGNOSIS — N184 Chronic kidney disease, stage 4 (severe): Secondary | ICD-10-CM | POA: Diagnosis not present

## 2023-09-17 DIAGNOSIS — D631 Anemia in chronic kidney disease: Secondary | ICD-10-CM | POA: Diagnosis not present

## 2023-09-17 DIAGNOSIS — R809 Proteinuria, unspecified: Secondary | ICD-10-CM | POA: Diagnosis not present

## 2023-09-17 DIAGNOSIS — E785 Hyperlipidemia, unspecified: Secondary | ICD-10-CM | POA: Diagnosis not present

## 2023-09-17 DIAGNOSIS — N2581 Secondary hyperparathyroidism of renal origin: Secondary | ICD-10-CM | POA: Diagnosis not present

## 2023-09-17 LAB — COMPREHENSIVE METABOLIC PANEL: eGFR: 18

## 2023-09-30 NOTE — Progress Notes (Unsigned)
Janice [name] is a 82 y.o. female that presents today for No chief complaint on file.   HPI: Patient is here for follow up on chronic medical conditions. Since our LOV she again had to return to the ER for gastroenteritis. She was ultimately treated with Augmentin for 7 days and symptoms resolved.   Hypertension -Medications: Losartan 50 mg (recently decreased), Norvasc 5 mg, Metoprolol 25 mg BID -She is compliant with above medications and reports no side effects -Checking BP at home: average about 129-130/80 -Denies any SOB, CP, vision changes or symptoms of hypotension -Does endorse chronic BLE swelling, R>L since vascular surgery  HLD -Medications: Crestor 20 mg -She is compliant with above medications and reports no side effects -Last lipid panel Lipid Panel     Component Value Date/Time   CHOL 162 09/11/2022 1458   CHOL 147 11/21/2015 1001   TRIG 182 (H) 09/11/2022 1458   HDL 55 09/11/2022 1458   HDL 51 11/21/2015 1001   CHOLHDL 2.9 09/11/2022 1458   VLDL 34 (H) 11/21/2016 1006   LDLCALC 79 09/11/2022 1458   LABVLDL 25 11/21/2015 1001   Pre-Diabetes: -Last A1c 4/24 5.6% -Not currently on any medication  CKD4: -Currently following with Nephrology, was just seen and had labs -Last Creatinine 10/24 2.59, GFR 18 -Currently taking Farxiga 10 mg per Neprho, reports no side effects -Just started on Spironolactone, discussed potentially starting dialysis with Nephrology but defers renal biopsy    Hypothyroidism -Medications: Levothyroxine 112 mcg  -Last TSH 10/23 2.78 -Doing well since increase in dose, denies anxiety, weight loss, lack of appetite, palpitations, skin changes  Vitamin D Deficiency: -Not interested in pursuing DEXA  -Currently on Calcitrol per Nephrology   Review of Systems  All other systems reviewed and are negative.   PAST MEDICAL, SURGICAL, FAMILY AND SOCIAL HISTORY: Reviewed in Epic and signed.  VITALS: There were no vitals filed for this  visit.  There is no height or weight on file to calculate BMI.   PHYSICAL EXAM: Physical Exam Constitutional:      Appearance: Normal appearance.  HENT:     Head: Normocephalic and atraumatic.  Eyes:     Conjunctiva/sclera: Conjunctivae normal.  Cardiovascular:     Rate and Rhythm: Normal rate and regular rhythm.  Pulmonary:     Effort: Pulmonary effort is normal.     Breath sounds: Normal breath sounds.  Musculoskeletal:     Right lower leg: Edema present.     Left lower leg: Edema present.  Skin:    General: Skin is warm and dry.  Neurological:     General: No focal deficit present.     Mental Status: She is alert. Mental status is at baseline.  Psychiatric:        Mood and Affect: Mood normal.        Behavior: Behavior normal.     Assessment & Plan:  1. Hypertensive renal disease with renal failure, 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 unspecified chronic kidney disease: Blood pressure stable here today. Continue Losartan 50 mg, Amlodipine 5 mg, Metoprolol 25 mg BID, refilled.   - amLODipine (NORVASC) 5 MG tablet; Take 1 tablet (5 mg total) by mouth daily.  Dispense: 90 tablet; Refill: 1 - metoprolol tartrate (LOPRESSOR) 25 MG tablet; Take 1 tablet (25 mg total) by mouth 2 (two) times daily.  Dispense: 180 tablet; Refill: 3  2. Mixed hyperlipidemia: Stable, doing well on Crestor 20 mg.  3. Prediabetes: A1c improved to 5.6%.   - POCT HgB  A1C  4. Hypothyroidism, unspecified type: Stable, continue Levothyroxine 112 mcg.    No follow-ups on file.

## 2023-10-01 ENCOUNTER — Encounter: Payer: Self-pay | Admitting: Internal Medicine

## 2023-10-01 ENCOUNTER — Ambulatory Visit (INDEPENDENT_AMBULATORY_CARE_PROVIDER_SITE_OTHER): Payer: Medicare Other | Admitting: Internal Medicine

## 2023-10-01 VITALS — BP 120/74 | HR 84 | Temp 97.8°F | Resp 18 | Ht 62.0 in | Wt 154.5 lb

## 2023-10-01 DIAGNOSIS — D649 Anemia, unspecified: Secondary | ICD-10-CM | POA: Diagnosis not present

## 2023-10-01 DIAGNOSIS — E782 Mixed hyperlipidemia: Secondary | ICD-10-CM

## 2023-10-01 DIAGNOSIS — R7303 Prediabetes: Secondary | ICD-10-CM | POA: Diagnosis not present

## 2023-10-01 DIAGNOSIS — Z23 Encounter for immunization: Secondary | ICD-10-CM | POA: Diagnosis not present

## 2023-10-01 DIAGNOSIS — I129 Hypertensive chronic kidney disease with stage 1 through stage 4 chronic kidney disease, or unspecified chronic kidney disease: Secondary | ICD-10-CM

## 2023-10-01 DIAGNOSIS — E039 Hypothyroidism, unspecified: Secondary | ICD-10-CM | POA: Diagnosis not present

## 2023-10-01 MED ORDER — ROSUVASTATIN CALCIUM 20 MG PO TABS: ORAL_TABLET | ORAL | 1 refills | Status: DC

## 2023-10-01 MED ORDER — AMLODIPINE BESYLATE 5 MG PO TABS: 5.0000 mg | ORAL_TABLET | Freq: Every day | ORAL | 1 refills | Status: DC

## 2023-10-01 MED ORDER — METOPROLOL TARTRATE 25 MG PO TABS: 25.0000 mg | ORAL_TABLET | Freq: Two times a day (BID) | ORAL | 3 refills | Status: DC

## 2023-10-02 ENCOUNTER — Telehealth: Payer: Self-pay

## 2023-10-02 ENCOUNTER — Other Ambulatory Visit: Payer: Self-pay | Admitting: Internal Medicine

## 2023-10-02 DIAGNOSIS — E039 Hypothyroidism, unspecified: Secondary | ICD-10-CM

## 2023-10-02 LAB — ELECTROLYTE PANEL
CO2: 20 mmol/L (ref 20–32)
Chloride: 98 mmol/L (ref 98–110)
Potassium: 5 mmol/L (ref 3.5–5.3)
Sodium: 131 mmol/L — ABNORMAL LOW (ref 135–146)

## 2023-10-02 LAB — HEMOGLOBIN A1C
Hgb A1c MFr Bld: 5.4 %{Hb} (ref <5.7)
Mean Plasma Glucose: 108 mg/dL
eAG (mmol/L): 6 mmol/L

## 2023-10-02 LAB — IRON,TIBC AND FERRITIN PANEL
%SAT: 19 % (ref 16–45)
Ferritin: 29 ng/mL (ref 16–288)
Iron: 66 ug/dL (ref 45–160)
TIBC: 351 ug/dL (ref 250–450)

## 2023-10-02 LAB — HEMOGLOBIN: Hemoglobin: 11.2 g/dL — ABNORMAL LOW (ref 11.7–15.5)

## 2023-10-02 LAB — TSH: TSH: 1.33 m[IU]/L (ref 0.40–4.50)

## 2023-10-02 MED ORDER — LEVOTHYROXINE SODIUM 112 MCG PO TABS: 112.0000 ug | ORAL_TABLET | Freq: Every day | ORAL | 1 refills | Status: DC

## 2023-10-02 NOTE — Telephone Encounter (Signed)
Appt sch'd with Erin for 10/11/2023

## 2023-10-02 NOTE — Telephone Encounter (Signed)
Pt's daughter Lupita Leash given lab results per notes of Dr. Caralee Ates on 10/02/23. Lupita Leash verbalized understanding. Lupita Leash stated the patient has been sluggish over the past 2 weeks and not as activity as she usually is. Patient is barely able to ambulate around Donna's apartment without becoming fatigued. Patient is not currently with Lupita Leash was not able to triage for additional information. Lupita Leash wanted to know if the low sodium level could be causing the fatigue? Please advise  Margarita Mail, DO 10/02/2023  8:05 AM EDT Back to Top    Potassium normal but sodium slightly low, try to decrease fluid intake slightly. Hemoglobin stable, iron levels normal. A1c good at 5.4%. Thyroid function normal, I will send refills of Levothyroxine today.

## 2023-10-02 NOTE — Telephone Encounter (Signed)
Low sodium should not be causing this, may need appt to address?  Please offer

## 2023-10-11 ENCOUNTER — Ambulatory Visit: Payer: Medicare Other | Admitting: Physician Assistant

## 2023-10-23 ENCOUNTER — Encounter: Payer: Self-pay | Admitting: Family Medicine

## 2023-10-23 ENCOUNTER — Ambulatory Visit (INDEPENDENT_AMBULATORY_CARE_PROVIDER_SITE_OTHER): Payer: Medicare Other | Admitting: Family Medicine

## 2023-10-23 VITALS — BP 122/80 | HR 72 | Temp 97.8°F | Ht 60.0 in | Wt 153.0 lb

## 2023-10-23 DIAGNOSIS — N184 Chronic kidney disease, stage 4 (severe): Secondary | ICD-10-CM | POA: Diagnosis not present

## 2023-10-23 DIAGNOSIS — I129 Hypertensive chronic kidney disease with stage 1 through stage 4 chronic kidney disease, or unspecified chronic kidney disease: Secondary | ICD-10-CM | POA: Diagnosis not present

## 2023-10-23 DIAGNOSIS — E782 Mixed hyperlipidemia: Secondary | ICD-10-CM | POA: Diagnosis not present

## 2023-10-23 DIAGNOSIS — H903 Sensorineural hearing loss, bilateral: Secondary | ICD-10-CM

## 2023-10-23 DIAGNOSIS — N2581 Secondary hyperparathyroidism of renal origin: Secondary | ICD-10-CM

## 2023-10-23 DIAGNOSIS — E039 Hypothyroidism, unspecified: Secondary | ICD-10-CM | POA: Diagnosis not present

## 2023-10-23 MED ORDER — AMLODIPINE BESYLATE 5 MG PO TABS
5.0000 mg | ORAL_TABLET | Freq: Every day | ORAL | 1 refills | Status: DC
Start: 2023-10-23 — End: 2023-11-12

## 2023-10-23 MED ORDER — METOPROLOL TARTRATE 25 MG PO TABS: 25.0000 mg | ORAL_TABLET | Freq: Two times a day (BID) | ORAL | 3 refills | Status: DC

## 2023-10-23 MED ORDER — ROSUVASTATIN CALCIUM 20 MG PO TABS: ORAL_TABLET | ORAL | 1 refills | Status: DC

## 2023-10-23 MED ORDER — LOSARTAN POTASSIUM 50 MG PO TABS: 50.0000 mg | ORAL_TABLET | Freq: Every day | ORAL | 0 refills | Status: DC

## 2023-10-23 MED ORDER — LEVOTHYROXINE SODIUM 112 MCG PO TABS: 112.0000 ug | ORAL_TABLET | Freq: Every day | ORAL | 1 refills | Status: DC

## 2023-10-23 NOTE — Assessment & Plan Note (Addendum)
Managed by nephrologist, Dr. Wynelle Link

## 2023-10-23 NOTE — Assessment & Plan Note (Addendum)
Chronic Well controlled No changes to medications, Cozaar 50 mg tablet once daily, Lopressor 25 mg tablet by mouth TWICE A DAY, and amlodipine 5 mg tablet once daily FU in 3 months

## 2023-10-23 NOTE — Assessment & Plan Note (Signed)
Well controlled.  No changes to medicines. Continue Crestor 20 mg by mouth once daily Continue to work on eating a healthy diet and exercise.

## 2023-10-23 NOTE — Assessment & Plan Note (Signed)
Thyroid therapeutic  Last TSH 1.33, 10-01-23 Continue to take levothyroxine 112 mcg tablet once daily before breakfast on empty stomach 30-60 minutes

## 2023-10-23 NOTE — Progress Notes (Signed)
New Patient Office Visit  Subjective    Patient ID: Janice [name], female    DOB: 02-10-1941  Age: 82 y.o. MRN: [id number]  CC:  Chief Complaint  Patient presents with   Establish Care    HPI Janice [name] presents to establish care. Transferring here so that she can have a local doctor. Currently see's  Dr. Wynelle Link for her CKD,  The patient, with a history of hyperthyroidism and kidney disease, is transferring care from another provider. She is currently on Synthroid for hyperthyroidism, Farxiga, calcitriol, and Torsemide for kidney disease, and amlodipine and metoprolol for hypertension. She reports that her kidney function has been declining, with a recent glomerular filtration rate (GFR) of 18, down from 37. She is scheduled for further labs next week.  In addition to her kidney disease, the patient reports significant hearing loss, particularly in the left ear. She has tried earwax removal treatments, but these have not improved her hearing. She is interested in a referral for hearing aids.  The patient denies any chest pain, shortness of breath, sinus or eye pain, muscle or joint pain, and insomnia. She does report occasional weakness in her legs but has not fallen.  Outpatient Encounter Medications as of 10/23/2023  Medication Sig   amLODipine (NORVASC) 5 MG tablet Take 1 tablet (5 mg total) by mouth daily.   aspirin EC 81 MG tablet Take 81 mg daily by mouth.   calcitRIOL (ROCALTROL) 0.25 MCG capsule Take 0.25 mcg by mouth daily.   FARXIGA 10 MG TABS tablet Take 10 mg by mouth daily.   levothyroxine (SYNTHROID) 112 MCG tablet Take 1 tablet (112 mcg total) by mouth daily.   losartan (COZAAR) 50 MG tablet Take 1 tablet (50 mg total) by mouth daily.   metoprolol tartrate (LOPRESSOR) 25 MG tablet Take 1 tablet (25 mg total) by mouth 2 (two) times daily.   rosuvastatin (CRESTOR) 20 MG tablet TAKE 1 TABLET BY MOUTH EVERYDAY AT BEDTIME   torsemide (DEMADEX) 10 MG tablet Take 10 mg  by mouth daily.   [DISCONTINUED] amLODipine (NORVASC) 5 MG tablet Take 1 tablet (5 mg total) by mouth daily.   [DISCONTINUED] ketoconazole (NIZORAL) 2 % cream Apply to both feet and between toes once daily for 6 weeks.   [DISCONTINUED] levothyroxine (SYNTHROID) 112 MCG tablet Take 1 tablet (112 mcg total) by mouth daily.   [DISCONTINUED] losartan (COZAAR) 50 MG tablet Take 1 tablet (50 mg total) by mouth daily.   [DISCONTINUED] metoprolol tartrate (LOPRESSOR) 25 MG tablet Take 1 tablet (25 mg total) by mouth 2 (two) times daily.   [DISCONTINUED] rosuvastatin (CRESTOR) 20 MG tablet TAKE 1 TABLET BY MOUTH EVERYDAY AT BEDTIME   [DISCONTINUED] triamcinolone ointment (KENALOG) 0.1 % USE GLOVES TO APPLY TO ROUGH SKIN ON FEET ONCE DAILY.   No facility-administered encounter medications on file as of 10/23/2023.    Past Medical History:  Diagnosis Date   Chronic kidney disease    Hernia, ventral    Hyperlipidemia    Hypertension    Subclavian arterial stenosis (HCC)    Thyroid disease     Past Surgical History:  Procedure Laterality Date   artery bypass rechanneling  2010   CHOLECYSTECTOMY     CLOSED REDUCTION NASAL FRACTURE N/A 10/10/2017   Procedure: CLOSED REDUCTION NASAL BONE FRACTURE;  Surgeon: [name], MD;  Location: [facility] ORS;  Service: ENT;  Laterality: N/A;   HERNIA REPAIR  03/05/2016   15 x 22 cm retrorectus [facility] mesh repair.  INSERTION OF MESH N/A 03/05/2016   Procedure: INSERTION OF MESH;  Surgeon: [name], MD;  Location: [facility] OR[facility];  Service: General;  Laterality: N/A;   VENTRAL HERNIA REPAIR N/A 03/05/2016   Procedure: HERNIA REPAIR VENTRAL ADULT;  Surgeon: [name], MD;  Location: [facility];  Service: General;  Laterality: N/A;    Family History  Problem Relation Age of Onset   Heart disease Mother    Hypertension Mother    Heart disease Father    Hypertension Father    Hypertension Sister    Diabetes Sister    Cancer Sister        lung    COPD Neg Hx    Stroke Neg Hx     Social History   Socioeconomic History   Marital status: Widowed    Spouse name: Not on file   Number of children: 4   Years of education: some college   Highest education level: Some college, no degree  Occupational History   Occupation: Retired  Tobacco Use   Smoking status: Former    Current packs/day: 0.00    Average packs/day: 2.0 packs/day for 50.0 years (100.0 ttl pk-yrs)    Types: Cigarettes    Start date: 12/03/1958    Quit date: 12/03/2008    Years since quitting: 14.8   Smokeless tobacco: Never   Tobacco comments:    smoking cessation materials not required  Vaping Use   Vaping status: Never Used  Substance and Sexual Activity   Alcohol use: Not Currently    Comment: OCCASIONALLY   Drug use: No   Sexual activity: Not Currently  Other Topics Concern   Not on file  Social History Narrative   Pt lives with her daughter   Social Determinants of Health   Financial Resource Strain: Low Risk  (10/20/2023)   Overall Financial Resource Strain (CARDIA)    Difficulty of Paying Living Expenses: Not hard at all  Food Insecurity: No Food Insecurity (10/20/2023)   Hunger Vital Sign    Worried About Running Out of Food in the Last Year: Never true    Ran Out of Food in the Last Year: Never true  Transportation Needs: No Transportation Needs (10/20/2023)   PRAPARE - Administrator, Civil Service (Medical): No    Lack of Transportation (Non-Medical): No  Physical Activity: Insufficiently Active (10/20/2023)   Exercise Vital Sign    Days of Exercise per Week: 1 day    Minutes of Exercise per Session: 20 min  Stress: No Stress Concern Present (10/20/2023)   [employer] of Occupational Health - Occupational Stress Questionnaire    Feeling of Stress : Not at all  Social Connections: Moderately Isolated (10/20/2023)   Social Connection and Isolation Panel [NHANES]    Frequency of Communication with Friends and Family: More  than three times a week    Frequency of Social Gatherings with Friends and Family: Twice a week    Attends Religious Services: 1 to 4 times per year    Active Member of [employer] or Organizations: No    Attends Banker Meetings: Never    Marital Status: Divorced  Catering manager Violence: Not At Risk (01/31/2023)   Humiliation, Afraid, Rape, and Kick questionnaire    Fear of Current or Ex-Partner: No    Emotionally Abused: No    Physically Abused: No    Sexually Abused: No    Review of Systems  Constitutional:  Negative for chills, fever and  malaise/fatigue.  HENT:  Positive for hearing loss. Negative for ear pain, sinus pain and sore throat.   Eyes:  Negative for double vision and pain.  Respiratory:  Negative for cough and shortness of breath.   Cardiovascular:  Negative for chest pain.  Musculoskeletal:  Negative for joint pain and myalgias.  Neurological:  Positive for weakness. Negative for headaches.  Psychiatric/Behavioral:  Negative for depression. The patient is not nervous/anxious and does not have insomnia.         Objective    BP 122/80   Pulse 72   Temp 97.8 F (36.6 C)   Ht 5' (1.524 m)   Wt 153 lb (69.4 kg)   SpO2 100%   BMI 29.88 kg/m   Physical Exam Vitals reviewed.  Constitutional:      Appearance: Normal appearance. She is normal weight.  HENT:     Right Ear: Tympanic membrane normal. Decreased hearing noted.     Left Ear: Tympanic membrane normal. Decreased hearing noted.     Nose: Nose normal.     Mouth/Throat:     Pharynx: No posterior oropharyngeal erythema.  Eyes:     Conjunctiva/sclera: Conjunctivae normal.  Neck:     Vascular: No carotid bruit.  Cardiovascular:     Rate and Rhythm: Normal rate and regular rhythm.     Heart sounds: Normal heart sounds.  Pulmonary:     Effort: Pulmonary effort is normal. No respiratory distress.     Breath sounds: Normal breath sounds. No wheezing.  Abdominal:     General: Abdomen is flat.  Bowel sounds are normal.     Palpations: Abdomen is soft.     Tenderness: There is no abdominal tenderness.  Musculoskeletal:        General: Normal range of motion.     Cervical back: Normal range of motion and neck supple.     Right lower leg: No edema.     Left lower leg: No edema.  Skin:    General: Skin is warm.  Neurological:     Mental Status: She is alert and oriented to person, place, and time.  Psychiatric:        Mood and Affect: Mood normal.        Behavior: Behavior normal.         Assessment & Plan:  Latrisa was seen today for establish care.  Chronic kidney disease, stage 4 (severe) (HCC) Assessment & Plan: Managed by Dr. Wynelle Link for her CKD Stage 4 Currently taking Farxiga 10 mg by mouth once daily   Orders: -     Losartan Potassium; Take 1 tablet (50 mg total) by mouth daily.  Dispense: 90 tablet; Refill: 0  Hypertensive renal disease Assessment & Plan: Chronic Well controlled No changes to medications, Cozaar 50 mg tablet once daily, Lopressor 25 mg tablet by mouth TWICE A DAY, and amlodipine 5 mg tablet once daily FU in 3 months    Orders: -     amLODIPine Besylate; Take 1 tablet (5 mg total) by mouth daily.  Dispense: 90 tablet; Refill: 1 -     Losartan Potassium; Take 1 tablet (50 mg total) by mouth daily.  Dispense: 90 tablet; Refill: 0 -     Metoprolol Tartrate; Take 1 tablet (25 mg total) by mouth 2 (two) times daily.  Dispense: 180 tablet; Refill: 3  Hypothyroidism, unspecified type Assessment & Plan: Thyroid therapeutic  Last TSH 1.33, 10-01-23 Continue to take levothyroxine 112 mcg tablet once daily before breakfast  on empty stomach 30-60 minutes   Orders: -     Levothyroxine Sodium; Take 1 tablet (112 mcg total) by mouth daily.  Dispense: 90 tablet; Refill: 1  Mixed hyperlipidemia Assessment & Plan: Well controlled.  No changes to medicines. Continue Crestor 20 mg by mouth once daily Continue to work on eating a healthy diet and  exercise.    Orders: -     Rosuvastatin Calcium; TAKE 1 TABLET BY MOUTH EVERYDAY AT BEDTIME  Dispense: 90 tablet; Refill: 1  Sensorineural hearing loss (SNHL) of both ears Assessment & Plan: Gradual progression of hearing loss.  Patient had wax removed at her last appointment and her hearing was still unimproved. - referral to audiology for hearing aids  Orders: -     Ambulatory referral to Audiology  Hyperparathyroidism, secondary renal [facility]) Assessment & Plan: Managed by nephrologist, Dr. Wynelle Link     Follow-up: Return in about 1 month (around 11/22/2023) for AWV, phone.    Total time spent on today's visit was greater than 40 minutes, including both face-to-face time and nonface-to-face time personally spent on review of chart (labs and imaging), discussing labs and goals, discussing further work-up, treatment options, referrals to specialist if needed, reviewing outside records if pertinent, answering patient's questions, and coordinating care.   Lajuana Matte, FNP [facility] [phone]

## 2023-10-23 NOTE — Assessment & Plan Note (Signed)
Managed by Dr. Wynelle Link for her CKD Stage 4 Currently taking Farxiga 10 mg by mouth once daily

## 2023-10-23 NOTE — Assessment & Plan Note (Signed)
Gradual progression of hearing loss.  Patient had wax removed at her last appointment and her hearing was still unimproved. - referral to audiology for hearing aids

## 2023-10-29 DIAGNOSIS — N184 Chronic kidney disease, stage 4 (severe): Secondary | ICD-10-CM | POA: Diagnosis not present

## 2023-10-29 DIAGNOSIS — I129 Hypertensive chronic kidney disease with stage 1 through stage 4 chronic kidney disease, or unspecified chronic kidney disease: Secondary | ICD-10-CM | POA: Diagnosis not present

## 2023-10-29 DIAGNOSIS — R809 Proteinuria, unspecified: Secondary | ICD-10-CM | POA: Diagnosis not present

## 2023-10-29 DIAGNOSIS — N2581 Secondary hyperparathyroidism of renal origin: Secondary | ICD-10-CM | POA: Diagnosis not present

## 2023-10-29 DIAGNOSIS — D631 Anemia in chronic kidney disease: Secondary | ICD-10-CM | POA: Diagnosis not present

## 2023-10-29 DIAGNOSIS — E785 Hyperlipidemia, unspecified: Secondary | ICD-10-CM | POA: Diagnosis not present

## 2023-11-04 ENCOUNTER — Telehealth: Payer: Self-pay | Admitting: Family Medicine

## 2023-11-04 NOTE — Telephone Encounter (Signed)
Marylu Lund was calling in stating that the request for patient medical record will not be approved due to [place] should be able to see other [place]

## 2023-11-05 DIAGNOSIS — N2581 Secondary hyperparathyroidism of renal origin: Secondary | ICD-10-CM | POA: Diagnosis not present

## 2023-11-05 DIAGNOSIS — N184 Chronic kidney disease, stage 4 (severe): Secondary | ICD-10-CM | POA: Diagnosis not present

## 2023-11-05 DIAGNOSIS — N281 Cyst of kidney, acquired: Secondary | ICD-10-CM | POA: Diagnosis not present

## 2023-11-05 DIAGNOSIS — R809 Proteinuria, unspecified: Secondary | ICD-10-CM | POA: Diagnosis not present

## 2023-11-05 DIAGNOSIS — D631 Anemia in chronic kidney disease: Secondary | ICD-10-CM | POA: Diagnosis not present

## 2023-11-05 DIAGNOSIS — I129 Hypertensive chronic kidney disease with stage 1 through stage 4 chronic kidney disease, or unspecified chronic kidney disease: Secondary | ICD-10-CM | POA: Diagnosis not present

## 2023-11-05 DIAGNOSIS — E785 Hyperlipidemia, unspecified: Secondary | ICD-10-CM | POA: Diagnosis not present

## 2023-11-12 ENCOUNTER — Ambulatory Visit: Payer: Medicare Other | Admitting: Physician Assistant

## 2023-11-12 ENCOUNTER — Ambulatory Visit: Payer: Self-pay | Admitting: Family Medicine

## 2023-11-12 ENCOUNTER — Encounter: Payer: Self-pay | Admitting: Physician Assistant

## 2023-11-12 VITALS — BP 90/58 | HR 61 | Temp 97.0°F | Ht 60.0 in | Wt 152.2 lb

## 2023-11-12 DIAGNOSIS — R2681 Unsteadiness on feet: Secondary | ICD-10-CM | POA: Diagnosis not present

## 2023-11-12 DIAGNOSIS — I129 Hypertensive chronic kidney disease with stage 1 through stage 4 chronic kidney disease, or unspecified chronic kidney disease: Secondary | ICD-10-CM

## 2023-11-12 DIAGNOSIS — N184 Chronic kidney disease, stage 4 (severe): Secondary | ICD-10-CM

## 2023-11-12 DIAGNOSIS — N186 End stage renal disease: Secondary | ICD-10-CM

## 2023-11-12 DIAGNOSIS — R220 Localized swelling, mass and lump, head: Secondary | ICD-10-CM | POA: Diagnosis not present

## 2023-11-12 DIAGNOSIS — S0031XA Abrasion of nose, initial encounter: Secondary | ICD-10-CM | POA: Diagnosis not present

## 2023-11-12 NOTE — Progress Notes (Signed)
Subjective:  Patient ID: Janice [name], female    DOB: [birth year]  Age: 82 y.o. MRN: [id number]  Chief Complaint  Patient presents with   Larey Seat and hit face    HPI Pt is here with daughter today - states that yesterday she 'felt shakey in her legs' and then fell forward onto the floor hitting her nose.  This did cause an abrasion above her nose and some swelling of the bridge of nose. Pt states that she does not feel her nose is broken and does not want to have any imaging done of her face.  She broke her nose about 4 years ago and had surgical repair that was not successful (says a bone was not able to be put back in place and has chronic swelling/malformation of nasal bridge) Daughter is concerned because of the shakiness and weakness She actually states that her gait has been staggered for awhile and that she has been having some falls and concerned she may have more - would like to see about therapy for conditioning/strenghening  Pt has end stage renal disease and follows nephrology regularly.  Recently she was placed on spironolactone to see if that would raise her GFR but actually it lowered it.  That medication was stopped about a week ago and her norvasc was increased from 5mg  to 10mg .  Since that time she has felt more shakey than normal and her bp has been low -- Today initially bp is 84/58 and still low on recheck 90/58  Daughter would like home health referral for her mother for her chronic medical issues     10/01/2023    2:43 PM 04/02/2023    3:04 PM 02/08/2023    1:03 PM 11/13/2022    8:50 AM 09/11/2022    2:33 PM  Depression screen PHQ 2/9  Decreased Interest 0 0 0 0 0  Down, Depressed, Hopeless 0 0 0 0 0  PHQ - 2 Score 0 0 0 0 0  Altered sleeping 0 0 0 0 0  Tired, decreased energy 0 0 0 0 0  Change in appetite 0 0 0 0 0  Feeling bad or failure about yourself  0 0 0 0 0  Trouble concentrating 0 0 0 0 0  Moving slowly or fidgety/restless 0 0 0 0 0  Suicidal thoughts 0  0 0 0 0  PHQ-9 Score 0 0 0 0 0  Difficult doing work/chores Not difficult at all Not difficult at all Not difficult at all Not difficult at all Not difficult at all        11/13/2022    8:49 AM 02/08/2023    1:03 PM 04/02/2023    3:00 PM 10/01/2023    2:41 PM 10/23/2023    1:34 PM  Fall Risk  Falls in the past year? 0 0 0 0 0  Was there an injury with Fall? 0 0 0 0 0  Fall Risk Category Calculator 0 0 0 0 0  Fall Risk Category (Retired) Low      (RETIRED) Patient Fall Risk Level Low fall risk      Patient at Risk for Falls Due to No Fall Risks    No Fall Risks  Fall risk Follow up Falls prevention discussed;Education provided;Falls evaluation completed    Falls evaluation completed     ROS CONSTITUTIONAL: see HPI  CARDIOVASCULAR: Negative for chest pain, dizziness, palpitations and pedal edema.  HEENT - external nose with abrasion and swelling - she does  have patency of both nares  RESPIRATORY: Negative for recent cough and dyspnea.  GASTROINTESTINAL: Negative for abdominal pain, acid reflux symptoms, constipation, diarrhea, nausea and vomiting.  MSK: Negative for arthralgias and myalgias. - did not injure any other part of body besides nose INTEGUMENTARY: abrasion on nose   Current Outpatient Medications:    amLODipine (NORVASC) 10 MG tablet, Take 10 mg by mouth daily., Disp: , Rfl:    aspirin EC 81 MG tablet, Take 81 mg daily by mouth., Disp: , Rfl:    calcitRIOL (ROCALTROL) 0.25 MCG capsule, Take 0.25 mcg by mouth daily., Disp: , Rfl:    FARXIGA 10 MG TABS tablet, Take 10 mg by mouth daily., Disp: , Rfl:    levothyroxine (SYNTHROID) 112 MCG tablet, Take 1 tablet (112 mcg total) by mouth daily., Disp: 90 tablet, Rfl: 1   metoprolol tartrate (LOPRESSOR) 25 MG tablet, Take 1 tablet (25 mg total) by mouth 2 (two) times daily., Disp: 180 tablet, Rfl: 3   rosuvastatin (CRESTOR) 20 MG tablet, TAKE 1 TABLET BY MOUTH EVERYDAY AT BEDTIME, Disp: 90 tablet, Rfl: 1   torsemide (DEMADEX) 10  MG tablet, Take 10 mg by mouth daily., Disp: , Rfl:   Past Medical History:  Diagnosis Date   Chronic kidney disease    Hernia, ventral    Hyperlipidemia    Hypertension    Subclavian arterial stenosis (HCC)    Thyroid disease    Objective:  PHYSICAL EXAM:   BP (!) 90/58   Pulse 61   Temp (!) 97 F (36.1 C) (Temporal)   Ht 5' (1.524 m)   Wt 152 lb 3.2 oz (69 kg)   SpO2 99%   BMI 29.72 kg/m    GEN: Well nourished, well developed, in no acute distress  HEENT: nares with abrasion and nasal bridge swelling -- nares open and patent Oropharynx - normal mucosa, palate, and posterior pharynx Cardiac: RRR; no murmurs, rubs, or gallops,no edema -  Respiratory:  normal respiratory rate and pattern with no distress - normal breath sounds with no rales, rhonchi, wheezes or rubs Neuro:  Alert and Oriented x 3, - CN II-Xii grossly intact Psych: euthymic mood, appropriate affect and demeanor  Assessment & Plan:    End stage renal disease (HCC) -     CBC with Differential/Platelet -     Comprehensive metabolic panel Follow up with nephrology as directed Hypertensive renal disease -     CBC with Differential/Platelet -     Comprehensive metabolic panel Continue norvasc 10mg  qd but stop losartan 50mg  for now with low blood pressure readings Abrasion, nose w/o infection Triple antibiotic cream to affected area Swelling of nose- possible fracture Pt declines imaging and will notify if pain persists or has trouble breathing through nares     Follow-up: Return in about 2 weeks (around 11/26/2023) for with me.  An After Visit Summary was printed and given to the patient.  Jettie Pagan [facility] [phone]

## 2023-11-12 NOTE — Telephone Encounter (Signed)
Copied from CRM [id number]. Topic: Clinical - Red Word Triage >> Nov 12, 2023  8:28 AM Deaijah H wrote: Red Word that prompted transfer to Nurse Triage: Patients daughter called in stating patient Larey Seat hit her head/busted nose (bruised)  Chief Complaint: fall Symptoms: fell on face and injured nose Frequency: happened yesterday around 2 pm Pertinent Negatives: Patient denies dizziness, confusion Disposition: [] ED /[] Urgent Care (no appt availability in office) / [x] Appointment(In office/virtual)/ []  [place] [facility]/ [] Home Care/ [] Refused Recommended Disposition /[] [place] [facility]/ []  Follow-up with PCP Additional Notes: patient's daughter called to schedule apt.  States mom fell yesterday around 2 pm and fell on nose, swollen.  States mom is in kidney failure and had some medications changed around.  States her legs are becoming weaker.  Instructed to take to UC for xrays and labs.  Daughter declined and asked for apt. With provider.  Instructed to take to er if becomes worse or new symptoms develop.   Reason for Disposition  [1] MODERATE weakness (i.e., interferes with work, school, normal activities) AND [2] new-onset or worsening  Answer Assessment - Initial Assessment Questions 1. MECHANISM: "How did the fall happen?"     2 pm yesterday, legs weak and gave out 3. ONSET: "When did the fall happen?" (e.g., minutes, hours, or days ago)     Yesterday at 2pm 4. LOCATION: "What part of the body hit the ground?" (e.g., back, buttocks, head, hips, knees, hands, head, stomach)     Hit face and injured nose 5. INJURY: "Did you hurt (injure) yourself when you fell?" If Yes, ask: "What did you injure? Tell me more about this?" (e.g., body area; type of injury; pain severity)"     Nose is swollen and bruised. 6. PAIN: "Is there any pain?" If Yes, ask: "How bad is the pain?" (e.g., Scale 1-10; or mild,  moderate, severe)   - NONE (0): No pain   - MILD (1-3): Doesn't interfere with  normal activities    - MODERATE (4-7): Interferes with normal activities or awakens from sleep    - SEVERE (8-10): Excruciating pain, unable to do any normal activities      Mild, she states 7. SIZE: For cuts, bruises, or swelling, ask: "How large is it?" (e.g., inches or centimeters)      Nose swollen 9. OTHER SYMPTOMS: "Do you have any other symptoms?" (e.g., dizziness, fever, weakness; new onset or worsening).      Weakness and is in kidney failure 10. CAUSE: "What do you think caused the fall (or falling)?" (e.g., tripped, dizzy spell)       Weak legs  Protocols used: Falls and [facility]

## 2023-11-13 ENCOUNTER — Other Ambulatory Visit: Payer: Self-pay | Admitting: Physician Assistant

## 2023-11-13 DIAGNOSIS — R899 Unspecified abnormal finding in specimens from other organs, systems and tissues: Secondary | ICD-10-CM

## 2023-11-13 DIAGNOSIS — R2681 Unsteadiness on feet: Secondary | ICD-10-CM | POA: Insufficient documentation

## 2023-11-13 LAB — CBC WITH DIFFERENTIAL/PLATELET
Basophils Absolute: 0.1 10*3/uL (ref 0.0–0.2)
Basos: 1 %
EOS (ABSOLUTE): 0.1 10*3/uL (ref 0.0–0.4)
Eos: 1 %
Hematocrit: 31.3 % — ABNORMAL LOW (ref 34.0–46.6)
Hemoglobin: 10 g/dL — ABNORMAL LOW (ref 11.1–15.9)
Immature Grans (Abs): 0 10*3/uL (ref 0.0–0.1)
Immature Granulocytes: 0 %
Lymphocytes Absolute: 1.4 10*3/uL (ref 0.7–3.1)
Lymphs: 14 %
MCH: 29.6 pg (ref 26.6–33.0)
MCHC: 31.9 g/dL (ref 31.5–35.7)
MCV: 93 fL (ref 79–97)
Monocytes Absolute: 0.6 10*3/uL (ref 0.1–0.9)
Monocytes: 6 %
Neutrophils Absolute: 7.5 10*3/uL — ABNORMAL HIGH (ref 1.4–7.0)
Neutrophils: 78 %
Platelets: 215 10*3/uL (ref 150–450)
RBC: 3.38 x10E6/uL — ABNORMAL LOW (ref 3.77–5.28)
RDW: 12.7 % (ref 11.7–15.4)
WBC: 9.6 10*3/uL (ref 3.4–10.8)

## 2023-11-13 LAB — COMPREHENSIVE METABOLIC PANEL
ALT: 11 [IU]/L (ref 0–32)
AST: 16 [IU]/L (ref 0–40)
Albumin: 4 g/dL (ref 3.7–4.7)
Alkaline Phosphatase: 60 [IU]/L (ref 44–121)
BUN/Creatinine Ratio: 16 (ref 12–28)
BUN: 52 mg/dL — ABNORMAL HIGH (ref 8–27)
Bilirubin Total: <0.2 mg/dL (ref 0.0–1.2)
CO2: 17 mmol/L — ABNORMAL LOW (ref 20–29)
Calcium: 10.7 mg/dL — ABNORMAL HIGH (ref 8.7–10.3)
Chloride: 104 mmol/L (ref 96–106)
Creatinine, Ser: 3.17 mg/dL — ABNORMAL HIGH (ref 0.57–1.00)
Globulin, Total: 2.8 g/dL (ref 1.5–4.5)
Glucose: 116 mg/dL — ABNORMAL HIGH (ref 70–99)
Potassium: 5.9 mmol/L — ABNORMAL HIGH (ref 3.5–5.2)
Sodium: 135 mmol/L (ref 134–144)
Total Protein: 6.8 g/dL (ref 6.0–8.5)
eGFR: 14 mL/min/{1.73_m2} — ABNORMAL LOW (ref >59)

## 2023-11-13 NOTE — Addendum Note (Signed)
Addended by: Marianne Sofia on: 11/13/2023 03:47 PM   Modules accepted: Orders

## 2023-11-14 ENCOUNTER — Ambulatory Visit: Payer: Medicare Other

## 2023-11-14 ENCOUNTER — Other Ambulatory Visit: Payer: Self-pay | Admitting: Physician Assistant

## 2023-11-14 ENCOUNTER — Telehealth: Payer: Self-pay

## 2023-11-14 DIAGNOSIS — D649 Anemia, unspecified: Secondary | ICD-10-CM | POA: Diagnosis not present

## 2023-11-14 DIAGNOSIS — R899 Unspecified abnormal finding in specimens from other organs, systems and tissues: Secondary | ICD-10-CM | POA: Diagnosis not present

## 2023-11-14 LAB — COMPREHENSIVE METABOLIC PANEL
ALT: 11 [IU]/L (ref 0–32)
AST: 15 [IU]/L (ref 0–40)
Albumin: 4.1 g/dL (ref 3.7–4.7)
Alkaline Phosphatase: 53 [IU]/L (ref 44–121)
BUN/Creatinine Ratio: 18 (ref 12–28)
BUN: 58 mg/dL — ABNORMAL HIGH (ref 8–27)
Bilirubin Total: <0.2 mg/dL (ref 0.0–1.2)
CO2: 17 mmol/L — ABNORMAL LOW (ref 20–29)
Calcium: 10.5 mg/dL — ABNORMAL HIGH (ref 8.7–10.3)
Chloride: 110 mmol/L — ABNORMAL HIGH (ref 96–106)
Creatinine, Ser: 3.15 mg/dL — ABNORMAL HIGH (ref 0.57–1.00)
Globulin, Total: 2.4 g/dL (ref 1.5–4.5)
Glucose: 125 mg/dL — ABNORMAL HIGH (ref 70–99)
Potassium: 5.4 mmol/L — ABNORMAL HIGH (ref 3.5–5.2)
Sodium: 139 mmol/L (ref 134–144)
Total Protein: 6.5 g/dL (ref 6.0–8.5)
eGFR: 14 mL/min/{1.73_m2} — ABNORMAL LOW (ref >59)

## 2023-11-14 NOTE — Telephone Encounter (Signed)
The patient stated that when someone called regarding her blood work she told them that she is not taking a multivitamin which was incorrect. The patient is taking a multivitamin.

## 2023-11-15 LAB — IRON,TIBC AND FERRITIN PANEL
Ferritin: 54 ng/mL (ref 15–150)
Iron Saturation: 11 % — ABNORMAL LOW (ref 15–55)
Iron: 35 ug/dL (ref 27–139)
Total Iron Binding Capacity: 325 ug/dL (ref 250–450)
UIBC: 290 ug/dL (ref 118–369)

## 2023-11-18 ENCOUNTER — Telehealth: Payer: Self-pay

## 2023-11-18 ENCOUNTER — Encounter: Payer: Self-pay | Admitting: Physician Assistant

## 2023-11-18 ENCOUNTER — Other Ambulatory Visit: Payer: Self-pay | Admitting: Physician Assistant

## 2023-11-18 DIAGNOSIS — I771 Stricture of artery: Secondary | ICD-10-CM | POA: Diagnosis not present

## 2023-11-18 DIAGNOSIS — E079 Disorder of thyroid, unspecified: Secondary | ICD-10-CM | POA: Diagnosis not present

## 2023-11-18 DIAGNOSIS — I129 Hypertensive chronic kidney disease with stage 1 through stage 4 chronic kidney disease, or unspecified chronic kidney disease: Secondary | ICD-10-CM | POA: Diagnosis not present

## 2023-11-18 DIAGNOSIS — Z7982 Long term (current) use of aspirin: Secondary | ICD-10-CM | POA: Diagnosis not present

## 2023-11-18 DIAGNOSIS — D508 Other iron deficiency anemias: Secondary | ICD-10-CM

## 2023-11-18 DIAGNOSIS — S0031XD Abrasion of nose, subsequent encounter: Secondary | ICD-10-CM | POA: Diagnosis not present

## 2023-11-18 DIAGNOSIS — N184 Chronic kidney disease, stage 4 (severe): Secondary | ICD-10-CM | POA: Diagnosis not present

## 2023-11-18 DIAGNOSIS — Z9181 History of falling: Secondary | ICD-10-CM | POA: Diagnosis not present

## 2023-11-18 DIAGNOSIS — E785 Hyperlipidemia, unspecified: Secondary | ICD-10-CM | POA: Diagnosis not present

## 2023-11-18 MED ORDER — AMLODIPINE BESYLATE 5 MG PO TABS: 5.0000 mg | ORAL_TABLET | Freq: Every day | ORAL | Status: DC

## 2023-11-18 MED ORDER — IRON (FERROUS SULFATE) 325 (65 FE) MG PO TABS: 325.0000 mg | ORAL_TABLET | Freq: Every day | ORAL | 3 refills | Status: DC

## 2023-11-18 NOTE — Telephone Encounter (Signed)
Sent!

## 2023-11-18 NOTE — Telephone Encounter (Signed)
Home health nurse Called and stated patient BP is low 80/40, patient is not having no symptoms, heart rate is normal and oxygen is normal. And also stated that patient is having aversion reaction when she is pooping her legs start to feel like jello, and  her neck will feel tight, not painful, denies chest pain, tingling in the arms, no left shoulder pain.  Per Marianne Sofia, PA-C: recommend patient cut amlodipine down to 5 mg daily and keep follow up appointment next week, if patient symptoms get worst go to nearest hospital.

## 2023-11-19 ENCOUNTER — Ambulatory Visit: Payer: Medicare Other

## 2023-11-21 ENCOUNTER — Other Ambulatory Visit: Payer: Self-pay | Admitting: Physician Assistant

## 2023-11-21 ENCOUNTER — Telehealth: Payer: Self-pay

## 2023-11-21 DIAGNOSIS — S0031XD Abrasion of nose, subsequent encounter: Secondary | ICD-10-CM | POA: Diagnosis not present

## 2023-11-21 DIAGNOSIS — E079 Disorder of thyroid, unspecified: Secondary | ICD-10-CM | POA: Diagnosis not present

## 2023-11-21 DIAGNOSIS — N184 Chronic kidney disease, stage 4 (severe): Secondary | ICD-10-CM | POA: Diagnosis not present

## 2023-11-21 DIAGNOSIS — I129 Hypertensive chronic kidney disease with stage 1 through stage 4 chronic kidney disease, or unspecified chronic kidney disease: Secondary | ICD-10-CM | POA: Diagnosis not present

## 2023-11-21 DIAGNOSIS — I771 Stricture of artery: Secondary | ICD-10-CM | POA: Diagnosis not present

## 2023-11-21 DIAGNOSIS — E785 Hyperlipidemia, unspecified: Secondary | ICD-10-CM | POA: Diagnosis not present

## 2023-11-21 NOTE — Telephone Encounter (Signed)
Patient Made Aware, Verbalized Understanding. Also made Home health [facility] aware

## 2023-11-21 NOTE — Telephone Encounter (Signed)
Called and talk to [facility] Nurse stated first she was a sitting and her BP was 90/54 with a heart rate of 50 and she had her sitting back and recheck her BP it was 88/50 with  a heart rate was 54, she listen to her heart beat for a minute and she had a irregular heart beat and she had edema in her legs. Stated that the daughter stated her swelling has improved. She told her to hold her metoprolol until she hear from [place] this afternoon. Pease advise

## 2023-11-21 NOTE — Telephone Encounter (Signed)
Pt is new to me and I have only seen her for an acute visit Why is she on metoprolol? Has she ever been evaluated by cardiology for irregular heart rhythm?

## 2023-11-21 NOTE — Telephone Encounter (Signed)
Patient stated she has been on medication for years and no she has not seen cardiology. Stated she had a stress test 10 years ago and they did heard a small irregular heart beat but nothing to worry about, and she stated that's why her bp has to be check on her right arm because she has a small blockage on her left arm. She only seen them to have the stress test for a surgery

## 2023-11-21 NOTE — Telephone Encounter (Signed)
Recommend pt continue lopressor as directed but stop norvasc Pt has appt on 12/31 for chronic issues --- continue to monitor bp --- if starts having chest pain or swelling recurs/worsens needs to go to ED for further evaluation otherwise follow up as scheduled

## 2023-11-21 NOTE — Telephone Encounter (Signed)
Copied from CRM [id number]. Topic: Medical Record Request - Provider/Facility Request >> Nov 21, 2023 12:34 PM Elle L wrote: Reason for CRM: Home Health Nurse with [facility], has concerns about the patient's prescribed medications lowering her blood pressure as she had a 90/54 and a 88/52 reading and requested a call back from PA-C Marianne Sofia' nurse. Her number is [phone].

## 2023-11-22 DIAGNOSIS — N184 Chronic kidney disease, stage 4 (severe): Secondary | ICD-10-CM | POA: Diagnosis not present

## 2023-11-22 DIAGNOSIS — E785 Hyperlipidemia, unspecified: Secondary | ICD-10-CM | POA: Diagnosis not present

## 2023-11-22 DIAGNOSIS — S0031XD Abrasion of nose, subsequent encounter: Secondary | ICD-10-CM | POA: Diagnosis not present

## 2023-11-22 DIAGNOSIS — I129 Hypertensive chronic kidney disease with stage 1 through stage 4 chronic kidney disease, or unspecified chronic kidney disease: Secondary | ICD-10-CM | POA: Diagnosis not present

## 2023-11-22 DIAGNOSIS — E079 Disorder of thyroid, unspecified: Secondary | ICD-10-CM | POA: Diagnosis not present

## 2023-11-22 DIAGNOSIS — I771 Stricture of artery: Secondary | ICD-10-CM | POA: Diagnosis not present

## 2023-11-28 DIAGNOSIS — E079 Disorder of thyroid, unspecified: Secondary | ICD-10-CM | POA: Diagnosis not present

## 2023-11-28 DIAGNOSIS — I129 Hypertensive chronic kidney disease with stage 1 through stage 4 chronic kidney disease, or unspecified chronic kidney disease: Secondary | ICD-10-CM | POA: Diagnosis not present

## 2023-11-28 DIAGNOSIS — E785 Hyperlipidemia, unspecified: Secondary | ICD-10-CM | POA: Diagnosis not present

## 2023-11-28 DIAGNOSIS — N184 Chronic kidney disease, stage 4 (severe): Secondary | ICD-10-CM | POA: Diagnosis not present

## 2023-11-28 DIAGNOSIS — I771 Stricture of artery: Secondary | ICD-10-CM | POA: Diagnosis not present

## 2023-11-28 DIAGNOSIS — S0031XD Abrasion of nose, subsequent encounter: Secondary | ICD-10-CM | POA: Diagnosis not present

## 2023-11-29 DIAGNOSIS — S0031XD Abrasion of nose, subsequent encounter: Secondary | ICD-10-CM | POA: Diagnosis not present

## 2023-11-29 DIAGNOSIS — N184 Chronic kidney disease, stage 4 (severe): Secondary | ICD-10-CM | POA: Diagnosis not present

## 2023-11-29 DIAGNOSIS — I771 Stricture of artery: Secondary | ICD-10-CM | POA: Diagnosis not present

## 2023-11-29 DIAGNOSIS — E785 Hyperlipidemia, unspecified: Secondary | ICD-10-CM | POA: Diagnosis not present

## 2023-11-29 DIAGNOSIS — E079 Disorder of thyroid, unspecified: Secondary | ICD-10-CM | POA: Diagnosis not present

## 2023-11-29 DIAGNOSIS — I129 Hypertensive chronic kidney disease with stage 1 through stage 4 chronic kidney disease, or unspecified chronic kidney disease: Secondary | ICD-10-CM | POA: Diagnosis not present

## 2023-11-30 DIAGNOSIS — I447 Left bundle-branch block, unspecified: Secondary | ICD-10-CM | POA: Diagnosis not present

## 2023-11-30 DIAGNOSIS — I509 Heart failure, unspecified: Secondary | ICD-10-CM | POA: Diagnosis not present

## 2023-11-30 DIAGNOSIS — R531 Weakness: Secondary | ICD-10-CM | POA: Diagnosis not present

## 2023-11-30 DIAGNOSIS — E860 Dehydration: Secondary | ICD-10-CM | POA: Diagnosis not present

## 2023-11-30 DIAGNOSIS — R9431 Abnormal electrocardiogram [ECG] [EKG]: Secondary | ICD-10-CM | POA: Diagnosis not present

## 2023-11-30 DIAGNOSIS — R4781 Slurred speech: Secondary | ICD-10-CM | POA: Diagnosis not present

## 2023-11-30 DIAGNOSIS — R404 Transient alteration of awareness: Secondary | ICD-10-CM | POA: Diagnosis not present

## 2023-11-30 DIAGNOSIS — I959 Hypotension, unspecified: Secondary | ICD-10-CM | POA: Diagnosis not present

## 2023-12-01 DIAGNOSIS — R0689 Other abnormalities of breathing: Secondary | ICD-10-CM | POA: Diagnosis not present

## 2023-12-01 DIAGNOSIS — R531 Weakness: Secondary | ICD-10-CM | POA: Diagnosis not present

## 2023-12-01 DIAGNOSIS — R000 Tachycardia, unspecified: Secondary | ICD-10-CM | POA: Diagnosis not present

## 2023-12-01 DIAGNOSIS — I739 Peripheral vascular disease, unspecified: Secondary | ICD-10-CM | POA: Diagnosis not present

## 2023-12-01 DIAGNOSIS — N189 Chronic kidney disease, unspecified: Secondary | ICD-10-CM | POA: Diagnosis not present

## 2023-12-01 DIAGNOSIS — F0390 Unspecified dementia without behavioral disturbance: Secondary | ICD-10-CM | POA: Diagnosis not present

## 2023-12-01 DIAGNOSIS — I951 Orthostatic hypotension: Secondary | ICD-10-CM | POA: Diagnosis not present

## 2023-12-01 DIAGNOSIS — I342 Nonrheumatic mitral (valve) stenosis: Secondary | ICD-10-CM | POA: Diagnosis not present

## 2023-12-01 DIAGNOSIS — I447 Left bundle-branch block, unspecified: Secondary | ICD-10-CM | POA: Diagnosis not present

## 2023-12-01 DIAGNOSIS — Z79899 Other long term (current) drug therapy: Secondary | ICD-10-CM | POA: Diagnosis not present

## 2023-12-01 DIAGNOSIS — N2581 Secondary hyperparathyroidism of renal origin: Secondary | ICD-10-CM | POA: Diagnosis not present

## 2023-12-01 DIAGNOSIS — I4891 Unspecified atrial fibrillation: Secondary | ICD-10-CM | POA: Diagnosis not present

## 2023-12-01 DIAGNOSIS — R570 Cardiogenic shock: Secondary | ICD-10-CM | POA: Diagnosis not present

## 2023-12-01 DIAGNOSIS — I130 Hypertensive heart and chronic kidney disease with heart failure and stage 1 through stage 4 chronic kidney disease, or unspecified chronic kidney disease: Secondary | ICD-10-CM | POA: Diagnosis not present

## 2023-12-01 DIAGNOSIS — Z95828 Presence of other vascular implants and grafts: Secondary | ICD-10-CM | POA: Diagnosis not present

## 2023-12-01 DIAGNOSIS — E785 Hyperlipidemia, unspecified: Secondary | ICD-10-CM | POA: Diagnosis not present

## 2023-12-01 DIAGNOSIS — R069 Unspecified abnormalities of breathing: Secondary | ICD-10-CM | POA: Diagnosis not present

## 2023-12-01 DIAGNOSIS — E039 Hypothyroidism, unspecified: Secondary | ICD-10-CM | POA: Diagnosis not present

## 2023-12-01 DIAGNOSIS — R0902 Hypoxemia: Secondary | ICD-10-CM | POA: Diagnosis not present

## 2023-12-01 DIAGNOSIS — E8729 Other acidosis: Secondary | ICD-10-CM | POA: Diagnosis not present

## 2023-12-01 DIAGNOSIS — M542 Cervicalgia: Secondary | ICD-10-CM | POA: Diagnosis not present

## 2023-12-01 DIAGNOSIS — I509 Heart failure, unspecified: Secondary | ICD-10-CM | POA: Diagnosis not present

## 2023-12-01 DIAGNOSIS — E860 Dehydration: Secondary | ICD-10-CM | POA: Diagnosis not present

## 2023-12-01 DIAGNOSIS — R0603 Acute respiratory distress: Secondary | ICD-10-CM | POA: Diagnosis not present

## 2023-12-01 DIAGNOSIS — D509 Iron deficiency anemia, unspecified: Secondary | ICD-10-CM | POA: Diagnosis not present

## 2023-12-01 DIAGNOSIS — R918 Other nonspecific abnormal finding of lung field: Secondary | ICD-10-CM | POA: Diagnosis not present

## 2023-12-01 DIAGNOSIS — R7989 Other specified abnormal findings of blood chemistry: Secondary | ICD-10-CM | POA: Diagnosis not present

## 2023-12-01 DIAGNOSIS — N179 Acute kidney failure, unspecified: Secondary | ICD-10-CM | POA: Diagnosis not present

## 2023-12-01 DIAGNOSIS — I129 Hypertensive chronic kidney disease with stage 1 through stage 4 chronic kidney disease, or unspecified chronic kidney disease: Secondary | ICD-10-CM | POA: Diagnosis not present

## 2023-12-01 DIAGNOSIS — I10 Essential (primary) hypertension: Secondary | ICD-10-CM | POA: Diagnosis not present

## 2023-12-01 DIAGNOSIS — R9431 Abnormal electrocardiogram [ECG] [EKG]: Secondary | ICD-10-CM | POA: Diagnosis not present

## 2023-12-01 DIAGNOSIS — Z87891 Personal history of nicotine dependence: Secondary | ICD-10-CM | POA: Diagnosis not present

## 2023-12-01 DIAGNOSIS — Z6828 Body mass index (BMI) 28.0-28.9, adult: Secondary | ICD-10-CM | POA: Diagnosis not present

## 2023-12-01 DIAGNOSIS — E669 Obesity, unspecified: Secondary | ICD-10-CM | POA: Diagnosis not present

## 2023-12-01 DIAGNOSIS — I959 Hypotension, unspecified: Secondary | ICD-10-CM | POA: Diagnosis not present

## 2023-12-01 DIAGNOSIS — Z7982 Long term (current) use of aspirin: Secondary | ICD-10-CM | POA: Diagnosis not present

## 2023-12-02 ENCOUNTER — Telehealth: Payer: Self-pay

## 2023-12-02 DIAGNOSIS — I342 Nonrheumatic mitral (valve) stenosis: Secondary | ICD-10-CM

## 2023-12-02 NOTE — Telephone Encounter (Signed)
Copied from CRM [id number]. Topic: General - Other >> Dec 02, 2023  2:57 PM Ivette P wrote: Reason for CRM: Leeroy Cha from [facility] could not see pt today due to her being in the hospital, pt is admitted to [facility]. Advised if any questions please call [phone]

## 2023-12-03 ENCOUNTER — Ambulatory Visit: Payer: Medicare Other | Admitting: Physician Assistant

## 2023-12-04 DIAGNOSIS — R7989 Other specified abnormal findings of blood chemistry: Secondary | ICD-10-CM | POA: Diagnosis not present

## 2023-12-04 DIAGNOSIS — I130 Hypertensive heart and chronic kidney disease with heart failure and stage 1 through stage 4 chronic kidney disease, or unspecified chronic kidney disease: Secondary | ICD-10-CM | POA: Diagnosis not present

## 2023-12-04 DIAGNOSIS — I447 Left bundle-branch block, unspecified: Secondary | ICD-10-CM | POA: Diagnosis not present

## 2023-12-04 DIAGNOSIS — N189 Chronic kidney disease, unspecified: Secondary | ICD-10-CM | POA: Diagnosis not present

## 2023-12-04 DIAGNOSIS — I951 Orthostatic hypotension: Secondary | ICD-10-CM | POA: Diagnosis not present

## 2023-12-04 DIAGNOSIS — I959 Hypotension, unspecified: Secondary | ICD-10-CM | POA: Diagnosis not present

## 2023-12-04 DIAGNOSIS — I10 Essential (primary) hypertension: Secondary | ICD-10-CM | POA: Diagnosis not present

## 2023-12-04 DIAGNOSIS — R918 Other nonspecific abnormal finding of lung field: Secondary | ICD-10-CM | POA: Diagnosis not present

## 2023-12-04 DIAGNOSIS — R0603 Acute respiratory distress: Secondary | ICD-10-CM | POA: Diagnosis not present

## 2023-12-04 DIAGNOSIS — I4891 Unspecified atrial fibrillation: Secondary | ICD-10-CM | POA: Diagnosis not present

## 2023-12-04 DIAGNOSIS — I509 Heart failure, unspecified: Secondary | ICD-10-CM | POA: Diagnosis not present

## 2023-12-04 DIAGNOSIS — R570 Cardiogenic shock: Secondary | ICD-10-CM | POA: Diagnosis not present

## 2023-12-05 ENCOUNTER — Telehealth: Payer: Self-pay

## 2023-12-05 DIAGNOSIS — E0590 Thyrotoxicosis, unspecified without thyrotoxic crisis or storm: Secondary | ICD-10-CM | POA: Diagnosis not present

## 2023-12-05 DIAGNOSIS — T7840XA Allergy, unspecified, initial encounter: Secondary | ICD-10-CM | POA: Diagnosis not present

## 2023-12-05 DIAGNOSIS — Z515 Encounter for palliative care: Secondary | ICD-10-CM | POA: Diagnosis not present

## 2023-12-05 DIAGNOSIS — N189 Chronic kidney disease, unspecified: Secondary | ICD-10-CM | POA: Diagnosis not present

## 2023-12-05 DIAGNOSIS — Z79899 Other long term (current) drug therapy: Secondary | ICD-10-CM | POA: Diagnosis not present

## 2023-12-05 DIAGNOSIS — I447 Left bundle-branch block, unspecified: Secondary | ICD-10-CM | POA: Diagnosis not present

## 2023-12-05 DIAGNOSIS — I951 Orthostatic hypotension: Secondary | ICD-10-CM | POA: Diagnosis not present

## 2023-12-05 DIAGNOSIS — N179 Acute kidney failure, unspecified: Secondary | ICD-10-CM | POA: Diagnosis not present

## 2023-12-05 DIAGNOSIS — E785 Hyperlipidemia, unspecified: Secondary | ICD-10-CM | POA: Diagnosis not present

## 2023-12-05 DIAGNOSIS — I517 Cardiomegaly: Secondary | ICD-10-CM | POA: Diagnosis not present

## 2023-12-05 DIAGNOSIS — R509 Fever, unspecified: Secondary | ICD-10-CM | POA: Diagnosis not present

## 2023-12-05 DIAGNOSIS — Z87891 Personal history of nicotine dependence: Secondary | ICD-10-CM | POA: Diagnosis not present

## 2023-12-05 DIAGNOSIS — I130 Hypertensive heart and chronic kidney disease with heart failure and stage 1 through stage 4 chronic kidney disease, or unspecified chronic kidney disease: Secondary | ICD-10-CM | POA: Diagnosis not present

## 2023-12-05 DIAGNOSIS — R000 Tachycardia, unspecified: Secondary | ICD-10-CM | POA: Diagnosis not present

## 2023-12-05 DIAGNOSIS — R7989 Other specified abnormal findings of blood chemistry: Secondary | ICD-10-CM | POA: Diagnosis not present

## 2023-12-05 DIAGNOSIS — T782XXA Anaphylactic shock, unspecified, initial encounter: Secondary | ICD-10-CM | POA: Diagnosis not present

## 2023-12-05 DIAGNOSIS — I509 Heart failure, unspecified: Secondary | ICD-10-CM | POA: Diagnosis not present

## 2023-12-05 DIAGNOSIS — R570 Cardiogenic shock: Secondary | ICD-10-CM | POA: Diagnosis not present

## 2023-12-05 DIAGNOSIS — I9589 Other hypotension: Secondary | ICD-10-CM | POA: Diagnosis not present

## 2023-12-05 DIAGNOSIS — Z79890 Hormone replacement therapy (postmenopausal): Secondary | ICD-10-CM | POA: Diagnosis not present

## 2023-12-05 DIAGNOSIS — I129 Hypertensive chronic kidney disease with stage 1 through stage 4 chronic kidney disease, or unspecified chronic kidney disease: Secondary | ICD-10-CM | POA: Diagnosis not present

## 2023-12-05 DIAGNOSIS — I4891 Unspecified atrial fibrillation: Secondary | ICD-10-CM | POA: Diagnosis not present

## 2023-12-05 DIAGNOSIS — R0602 Shortness of breath: Secondary | ICD-10-CM | POA: Diagnosis not present

## 2023-12-05 DIAGNOSIS — R579 Shock, unspecified: Secondary | ICD-10-CM | POA: Diagnosis not present

## 2023-12-05 DIAGNOSIS — A419 Sepsis, unspecified organism: Secondary | ICD-10-CM | POA: Diagnosis not present

## 2023-12-05 DIAGNOSIS — I5020 Unspecified systolic (congestive) heart failure: Secondary | ICD-10-CM | POA: Diagnosis not present

## 2023-12-05 DIAGNOSIS — Z4659 Encounter for fitting and adjustment of other gastrointestinal appliance and device: Secondary | ICD-10-CM | POA: Diagnosis not present

## 2023-12-05 DIAGNOSIS — E872 Acidosis, unspecified: Secondary | ICD-10-CM | POA: Diagnosis not present

## 2023-12-05 DIAGNOSIS — D72829 Elevated white blood cell count, unspecified: Secondary | ICD-10-CM | POA: Diagnosis not present

## 2023-12-05 DIAGNOSIS — J9601 Acute respiratory failure with hypoxia: Secondary | ICD-10-CM | POA: Diagnosis not present

## 2023-12-05 DIAGNOSIS — I959 Hypotension, unspecified: Secondary | ICD-10-CM | POA: Diagnosis not present

## 2023-12-05 DIAGNOSIS — I4892 Unspecified atrial flutter: Secondary | ICD-10-CM | POA: Diagnosis not present

## 2023-12-05 DIAGNOSIS — N184 Chronic kidney disease, stage 4 (severe): Secondary | ICD-10-CM | POA: Diagnosis not present

## 2023-12-05 DIAGNOSIS — N390 Urinary tract infection, site not specified: Secondary | ICD-10-CM | POA: Diagnosis not present

## 2023-12-05 DIAGNOSIS — E039 Hypothyroidism, unspecified: Secondary | ICD-10-CM | POA: Diagnosis not present

## 2023-12-05 DIAGNOSIS — R6521 Severe sepsis with septic shock: Secondary | ICD-10-CM | POA: Diagnosis not present

## 2023-12-05 NOTE — Transitions of Care (Post Inpatient/ED Visit) (Signed)
   12/05/2023  Name: Janice [name] MRN: [id number] DOB: 1941/10/23  Today's TOC FU Call Status: Today's TOC FU Call Status:: Unsuccessful Call (1st Attempt) Unsuccessful Call (1st Attempt) Date: 12/05/23  Attempted to reach the patient regarding the most recent Inpatient/ED visit. Per daughter patient remained at [facility] she is intubated in ICU  Follow Up Plan: No further outreach attempts will be made at this time. We have been unable to contact the patient.   Bari Mayans , BSN, RN Care Management Coordinator [place]   [facility] [email or web address] Direct Dial: [phone]

## 2023-12-06 DIAGNOSIS — I447 Left bundle-branch block, unspecified: Secondary | ICD-10-CM | POA: Diagnosis not present

## 2023-12-06 DIAGNOSIS — R509 Fever, unspecified: Secondary | ICD-10-CM | POA: Diagnosis not present

## 2023-12-06 DIAGNOSIS — I4891 Unspecified atrial fibrillation: Secondary | ICD-10-CM | POA: Diagnosis not present

## 2023-12-07 DIAGNOSIS — I4892 Unspecified atrial flutter: Secondary | ICD-10-CM | POA: Diagnosis not present

## 2023-12-07 DIAGNOSIS — R000 Tachycardia, unspecified: Secondary | ICD-10-CM | POA: Diagnosis not present

## 2023-12-07 DIAGNOSIS — I447 Left bundle-branch block, unspecified: Secondary | ICD-10-CM | POA: Diagnosis not present

## 2023-12-09 ENCOUNTER — Telehealth: Payer: Self-pay

## 2023-12-09 ENCOUNTER — Ambulatory Visit: Payer: Medicare Other | Admitting: Family Medicine

## 2023-12-09 NOTE — Telephone Encounter (Signed)
 [facility]  ORDER NUMBER: [id number]

## 2023-12-13 ENCOUNTER — Ambulatory Visit: Payer: Medicare Other | Admitting: Podiatry

## 2023-12-19 ENCOUNTER — Ambulatory Visit: Payer: Medicare Other

## 2023-12-31 ENCOUNTER — Ambulatory Visit: Payer: Medicare Other | Admitting: Internal Medicine

## 2024-01-04 DEATH — deceased

## 2024-01-14 ENCOUNTER — Ambulatory Visit: Payer: Medicare Other

## 2024-01-14 ENCOUNTER — Ambulatory Visit: Payer: Medicare Other | Admitting: Family Medicine

## 2024-01-21 ENCOUNTER — Ambulatory Visit: Payer: Medicare Other | Admitting: Family Medicine
